# Patient Record
Sex: Female | Born: 1955 | Race: White | Hispanic: No | Marital: Married | Smoking: Never smoker
Health system: Southern US, Community
[De-identification: ages and names within clinical notes are randomized; demographics above are authoritative.]

## PROBLEM LIST (undated history)

## (undated) DIAGNOSIS — Z780 Asymptomatic menopausal state: Secondary | ICD-10-CM

## (undated) DIAGNOSIS — C50911 Malignant neoplasm of unspecified site of right female breast: Secondary | ICD-10-CM

## (undated) HISTORY — DX: Asymptomatic menopausal state: Z78.0

---

## 2006-12-10 DIAGNOSIS — Z780 Asymptomatic menopausal state: Secondary | ICD-10-CM

## 2006-12-10 HISTORY — DX: Asymptomatic menopausal state: Z78.0

## 2013-12-10 DIAGNOSIS — C50911 Malignant neoplasm of unspecified site of right female breast: Secondary | ICD-10-CM

## 2013-12-10 HISTORY — DX: Malignant neoplasm of unspecified site of right female breast: C50.911

## 2014-12-13 ENCOUNTER — Ambulatory Visit: Payer: Self-pay | Admitting: Oncology

## 2014-12-15 LAB — CBC CANCER CENTER
BASOS ABS: 0 x10 3/mm (ref 0.0–0.1)
BASOS PCT: 1 %
Eosinophil #: 0 x10 3/mm (ref 0.0–0.7)
Eosinophil %: 1 %
HCT: 38.3 % (ref 35.0–47.0)
HGB: 12.9 g/dL (ref 12.0–16.0)
LYMPHS PCT: 31 %
Lymphocyte #: 1.2 x10 3/mm (ref 1.0–3.6)
MCH: 29.7 pg (ref 26.0–34.0)
MCHC: 33.6 g/dL (ref 32.0–36.0)
MCV: 88 fL (ref 80–100)
MONOS PCT: 6.7 %
Monocyte #: 0.3 x10 3/mm (ref 0.2–0.9)
NEUTROS PCT: 60.3 %
Neutrophil #: 2.3 x10 3/mm (ref 1.4–6.5)
PLATELETS: 192 x10 3/mm (ref 150–440)
RBC: 4.33 10*6/uL (ref 3.80–5.20)
RDW: 14 % (ref 11.5–14.5)
WBC: 3.7 x10 3/mm (ref 3.6–11.0)

## 2014-12-15 LAB — COMPREHENSIVE METABOLIC PANEL
ALBUMIN: 4.3 g/dL (ref 3.4–5.0)
ALT: 23 U/L
Alkaline Phosphatase: 155 U/L — ABNORMAL HIGH
Anion Gap: 11 (ref 7–16)
BILIRUBIN TOTAL: 0.5 mg/dL (ref 0.2–1.0)
BUN: 13 mg/dL (ref 7–18)
CREATININE: 0.73 mg/dL (ref 0.60–1.30)
Calcium, Total: 9.4 mg/dL (ref 8.5–10.1)
Chloride: 102 mmol/L (ref 98–107)
Co2: 28 mmol/L (ref 21–32)
EGFR (African American): >60
EGFR (Non-African Amer.): >60
Glucose: 94 mg/dL (ref 65–99)
OSMOLALITY: 281 (ref 275–301)
Potassium: 3.9 mmol/L (ref 3.5–5.1)
SGOT(AST): 13 U/L — ABNORMAL LOW (ref 15–37)
SODIUM: 141 mmol/L (ref 136–145)
Total Protein: 7.7 g/dL (ref 6.4–8.2)

## 2014-12-17 LAB — CANCER ANTIGEN 27.29: CA 27.29: 221 U/mL — ABNORMAL HIGH (ref 0.0–38.6)

## 2015-01-05 LAB — COMPREHENSIVE METABOLIC PANEL
ALK PHOS: 128 U/L — AB (ref 46–116)
ALT: 16 U/L (ref 14–63)
Albumin: 4.1 g/dL (ref 3.4–5.0)
Anion Gap: 10 (ref 7–16)
BILIRUBIN TOTAL: 0.5 mg/dL (ref 0.2–1.0)
BUN: 15 mg/dL (ref 7–18)
Calcium, Total: 8.7 mg/dL (ref 8.5–10.1)
Chloride: 101 mmol/L (ref 98–107)
Co2: 28 mmol/L (ref 21–32)
Creatinine: 0.83 mg/dL (ref 0.60–1.30)
EGFR (African American): >60
EGFR (Non-African Amer.): >60
GLUCOSE: 97 mg/dL (ref 65–99)
OSMOLALITY: 278 (ref 275–301)
POTASSIUM: 4.1 mmol/L (ref 3.5–5.1)
SGOT(AST): 10 U/L — ABNORMAL LOW (ref 15–37)
SODIUM: 139 mmol/L (ref 136–145)
TOTAL PROTEIN: 7.4 g/dL (ref 6.4–8.2)

## 2015-01-05 LAB — CBC CANCER CENTER
Basophil #: 0 x10 3/mm (ref 0.0–0.1)
Basophil %: 0.8 %
EOS PCT: 1.4 %
Eosinophil #: 0 x10 3/mm (ref 0.0–0.7)
HCT: 37.4 % (ref 35.0–47.0)
HGB: 12.2 g/dL (ref 12.0–16.0)
LYMPHS ABS: 0.8 x10 3/mm — AB (ref 1.0–3.6)
Lymphocyte %: 53.3 %
MCH: 29.6 pg (ref 26.0–34.0)
MCHC: 32.7 g/dL (ref 32.0–36.0)
MCV: 90 fL (ref 80–100)
MONO ABS: 0 x10 3/mm — AB (ref 0.2–0.9)
Monocyte %: 2.7 %
Neutrophil #: 0.6 x10 3/mm — ABNORMAL LOW (ref 1.4–6.5)
Neutrophil %: 41.8 %
Platelet: 165 x10 3/mm (ref 150–440)
RBC: 4.14 10*6/uL (ref 3.80–5.20)
RDW: 13.8 % (ref 11.5–14.5)
WBC: 1.4 x10 3/mm — CL (ref 3.6–11.0)

## 2015-01-10 ENCOUNTER — Ambulatory Visit: Payer: Self-pay | Admitting: Oncology

## 2015-02-08 ENCOUNTER — Ambulatory Visit
Admit: 2015-02-08 | Disposition: A | Discharge status: Home / Self Care | Payer: Self-pay | Attending: Oncology | Admitting: Oncology

## 2015-03-09 LAB — COMPREHENSIVE METABOLIC PANEL
ALBUMIN: 4.5 g/dL
ALK PHOS: 57 U/L
ANION GAP: 9 (ref 7–16)
BUN: 14 mg/dL
Bilirubin,Total: 0.6 mg/dL
CO2: 26 mmol/L
CREATININE: 0.69 mg/dL
Calcium, Total: 9.5 mg/dL
Chloride: 104 mmol/L
EGFR (African American): >60
EGFR (Non-African Amer.): >60
GLUCOSE: 82 mg/dL
Potassium: 3.9 mmol/L
SGOT(AST): 20 U/L
SGPT (ALT): 14 U/L
Sodium: 139 mmol/L
Total Protein: 7.3 g/dL

## 2015-03-09 LAB — CBC CANCER CENTER
BANDS NEUTROPHIL: 4 %
Comment - H1-Com2: NORMAL
EOS PCT: 1 %
HCT: 34.1 % — AB (ref 35.0–47.0)
HGB: 11.1 g/dL — AB (ref 12.0–16.0)
Lymphocytes: 42 %
MCH: 32 pg (ref 26.0–34.0)
MCHC: 32.5 g/dL (ref 32.0–36.0)
MCV: 99 fL (ref 80–100)
Monocytes: 3 %
PLATELETS: 99 x10 3/mm — AB (ref 150–440)
RBC: 3.46 10*6/uL — AB (ref 3.80–5.20)
RDW: 21.6 % — ABNORMAL HIGH (ref 11.5–14.5)
SEGMENTED NEUTROPHILS: 50 %
WBC: 1.7 x10 3/mm — CL (ref 3.6–11.0)

## 2015-03-10 LAB — CANCER ANTIGEN 27.29: CA 27.29: 122.8 U/mL — AB (ref 0.0–38.6)

## 2015-03-11 ENCOUNTER — Ambulatory Visit
Admit: 2015-03-11 | Disposition: A | Discharge status: Home / Self Care | Payer: Self-pay | Attending: Oncology | Admitting: Oncology

## 2015-04-06 LAB — CBC CANCER CENTER
BASOS ABS: 0 x10 3/mm (ref 0.0–0.1)
BASOS PCT: 1.2 %
EOS PCT: 1 %
Eosinophil #: 0 x10 3/mm (ref 0.0–0.7)
HCT: 33.4 % — AB (ref 35.0–47.0)
HGB: 11.4 g/dL — AB (ref 12.0–16.0)
LYMPHS ABS: 0.9 x10 3/mm — AB (ref 1.0–3.6)
LYMPHS PCT: 43 %
MCH: 33.9 pg (ref 26.0–34.0)
MCHC: 33.9 g/dL (ref 32.0–36.0)
MCV: 100 fL (ref 80–100)
MONOS PCT: 6.2 %
Monocyte #: 0.1 x10 3/mm — ABNORMAL LOW (ref 0.2–0.9)
NEUTROS ABS: 1 x10 3/mm — AB (ref 1.4–6.5)
Neutrophil %: 48.6 %
Platelet: 168 x10 3/mm (ref 150–440)
RBC: 3.35 10*6/uL — AB (ref 3.80–5.20)
RDW: 21.6 % — AB (ref 11.5–14.5)
WBC: 2.2 x10 3/mm — AB (ref 3.6–11.0)

## 2015-04-06 LAB — COMPREHENSIVE METABOLIC PANEL
Albumin: 4.7 g/dL
Alkaline Phosphatase: 62 U/L
Anion Gap: 6 — ABNORMAL LOW (ref 7–16)
BUN: 20 mg/dL
Bilirubin,Total: 0.6 mg/dL
CO2: 28 mmol/L
CREATININE: 0.78 mg/dL
Calcium, Total: 9.5 mg/dL
Chloride: 105 mmol/L
EGFR (African American): >60
EGFR (Non-African Amer.): >60
GLUCOSE: 90 mg/dL
Potassium: 4 mmol/L
SGOT(AST): 17 U/L
SGPT (ALT): 14 U/L
Sodium: 139 mmol/L
TOTAL PROTEIN: 7.8 g/dL

## 2015-04-07 LAB — CANCER ANTIGEN 27.29: CA 27.29: 121.1 U/mL — ABNORMAL HIGH (ref 0.0–38.6)

## 2015-04-29 ENCOUNTER — Other Ambulatory Visit: Payer: Self-pay | Admitting: *Deleted

## 2015-04-29 DIAGNOSIS — C50919 Malignant neoplasm of unspecified site of unspecified female breast: Secondary | ICD-10-CM

## 2015-05-04 ENCOUNTER — Inpatient Hospital Stay (HOSPITAL_BASED_OUTPATIENT_CLINIC_OR_DEPARTMENT_OTHER): Payer: BLUE CROSS/BLUE SHIELD | Admitting: Oncology

## 2015-05-04 ENCOUNTER — Encounter: Payer: Self-pay | Admitting: Oncology

## 2015-05-04 ENCOUNTER — Encounter (INDEPENDENT_AMBULATORY_CARE_PROVIDER_SITE_OTHER): Payer: Self-pay

## 2015-05-04 ENCOUNTER — Inpatient Hospital Stay: Payer: BLUE CROSS/BLUE SHIELD | Attending: Oncology

## 2015-05-04 ENCOUNTER — Other Ambulatory Visit: Payer: Self-pay | Admitting: Oncology

## 2015-05-04 ENCOUNTER — Inpatient Hospital Stay: Payer: BLUE CROSS/BLUE SHIELD

## 2015-05-04 VITALS — BP 163/76 | HR 71 | Temp 95.9°F | Wt 144.8 lb

## 2015-05-04 DIAGNOSIS — D702 Other drug-induced agranulocytosis: Secondary | ICD-10-CM

## 2015-05-04 DIAGNOSIS — T50995S Adverse effect of other drugs, medicaments and biological substances, sequela: Secondary | ICD-10-CM

## 2015-05-04 DIAGNOSIS — Z79899 Other long term (current) drug therapy: Secondary | ICD-10-CM | POA: Insufficient documentation

## 2015-05-04 DIAGNOSIS — C50811 Malignant neoplasm of overlapping sites of right female breast: Secondary | ICD-10-CM

## 2015-05-04 DIAGNOSIS — Z170 Estrogen receptor positive status [ER+]: Secondary | ICD-10-CM

## 2015-05-04 DIAGNOSIS — C50911 Malignant neoplasm of unspecified site of right female breast: Secondary | ICD-10-CM

## 2015-05-04 DIAGNOSIS — C7951 Secondary malignant neoplasm of bone: Secondary | ICD-10-CM | POA: Insufficient documentation

## 2015-05-04 DIAGNOSIS — Z79818 Long term (current) use of other agents affecting estrogen receptors and estrogen levels: Secondary | ICD-10-CM | POA: Insufficient documentation

## 2015-05-04 DIAGNOSIS — C50919 Malignant neoplasm of unspecified site of unspecified female breast: Secondary | ICD-10-CM

## 2015-05-04 LAB — CBC WITH DIFFERENTIAL/PLATELET
Basophils Absolute: 0 10*3/uL (ref 0–0.1)
Eosinophils Absolute: 0 10*3/uL (ref 0–0.7)
HCT: 34.3 % — ABNORMAL LOW (ref 35.0–47.0)
Hemoglobin: 11.7 g/dL — ABNORMAL LOW (ref 12.0–16.0)
LYMPHS ABS: 0.7 10*3/uL — AB (ref 1.0–3.6)
Lymphocytes Relative: 39 %
MCH: 35.3 pg — AB (ref 26.0–34.0)
MCHC: 34 g/dL (ref 32.0–36.0)
MCV: 103.8 fL — ABNORMAL HIGH (ref 80.0–100.0)
Monocytes Absolute: 0.1 10*3/uL — ABNORMAL LOW (ref 0.2–0.9)
Monocytes Relative: 6 %
Neutro Abs: 0.9 10*3/uL — ABNORMAL LOW (ref 1.4–6.5)
Neutrophils Relative %: 52 %
Platelets: 178 10*3/uL (ref 150–440)
RBC: 3.31 MIL/uL — ABNORMAL LOW (ref 3.80–5.20)
RDW: 18.3 % — ABNORMAL HIGH (ref 11.5–14.5)
WBC: 1.8 10*3/uL — ABNORMAL LOW (ref 3.6–11.0)

## 2015-05-04 LAB — COMPREHENSIVE METABOLIC PANEL
ALBUMIN: 4.6 g/dL (ref 3.5–5.0)
ALT: 14 U/L (ref 14–54)
AST: 18 U/L (ref 15–41)
Alkaline Phosphatase: 58 U/L (ref 38–126)
Anion gap: 4 — ABNORMAL LOW (ref 5–15)
BILIRUBIN TOTAL: 0.7 mg/dL (ref 0.3–1.2)
BUN: 17 mg/dL (ref 6–20)
CALCIUM: 9.7 mg/dL (ref 8.9–10.3)
CHLORIDE: 104 mmol/L (ref 101–111)
CO2: 30 mmol/L (ref 22–32)
CREATININE: 0.68 mg/dL (ref 0.44–1.00)
GFR calc Af Amer: >60 mL/min (ref >60)
GFR calc non Af Amer: >60 mL/min (ref >60)
GLUCOSE: 86 mg/dL (ref 65–99)
POTASSIUM: 3.6 mmol/L (ref 3.5–5.1)
Sodium: 138 mmol/L (ref 135–145)
Total Protein: 7.8 g/dL (ref 6.5–8.1)

## 2015-05-04 MED ORDER — DENOSUMAB 120 MG/1.7ML ~~LOC~~ SOLN
120.0000 mg | Freq: Once | SUBCUTANEOUS | Status: AC
  Administered 2015-05-04: 120 mg via SUBCUTANEOUS
  Filled 2015-05-04: qty 1.7

## 2015-05-04 NOTE — Progress Notes (Signed)
Pt never smoked.  Does not have living will.

## 2015-05-05 ENCOUNTER — Telehealth: Payer: Self-pay | Admitting: *Deleted

## 2015-05-05 LAB — CANCER ANTIGEN 27.29: CA 27.29: 95.5 U/mL — AB (ref 0.0–38.6)

## 2015-05-05 NOTE — Telephone Encounter (Signed)
Called patient and left message to inform her that CA 27.29 is looking better. 95.5 as of 05-04-15.

## 2015-05-09 ENCOUNTER — Encounter: Payer: Self-pay | Admitting: Oncology

## 2015-05-09 NOTE — Progress Notes (Signed)
[name] @ [facility] Telephone:(336) [phone]  Fax:(336) [place] OB: 30-Aug-1956  MR#: [id number]  PNT#:[id number]  Patient Care Team: Pcp Not In System as PCP - General  CHIEF COMPLAINT:  Chief Complaint  Patient presents with  . Follow-up    Oncology History   1carcinoma of breast biopsy done from the mass at 9:00 position (right breast) at [facility] on October 20, 2014.   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  Estrogen receptor +94%.  Progesterone receptor +95%.  HER-2/neu receptor -0. 2.  X-rays of the lumbar spine done in now September of 2015 revealed diffuse sclerotic bone lesion. 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  Liver is normal.  There are multiple sub-centimeter porta hepatis and pelvic and inguinal lymphadenopathy 4.  Recent has received 2 weeks of radiation therapy to the right hip at [facility] 5.  Has been started on letrozole 3 weeks ago (November 22, 2014) 6.recent has been switched to [facility] and letrozole 2 weeks ago starting from January 13 (IBRANCE 125 mg by mouth for 3 weeks with one week off ) dose of IBRANCE has been reduced 100 mg because of neutropenia     Cancer of breast   05/04/2015 Initial Diagnosis Cancer of breast    Oncology Flowsheet 05/04/2015  denosumab (XGEVA) [place] 120 mg    INTERVAL HISTORY:  59 year old lady came today for further follow-up regarding stage IV carcinoma breast.  Patient is taking IBRANCE, letrozole Also getting XGEVA.  No bony pain appetite has been fairly good.  Ambulation is improved.  No nausea.  No vomiting.  No diarrhea.  REVIEW OF SYSTEMS:   GENERAL:  Feels good.  Active.  No fevers, sweats or weight loss. PERFORMANCE STATUS (ECOG):0 HEENT:  No visual changes, runny nose, sore  throat, mouth sores or tenderness. Lungs: No shortness of breath or cough.  No hemoptysis. Cardiac:  No chest pain, palpitations, orthopnea, or PND. GI:  No nausea, vomiting, diarrhea, constipation, melena or hematochezia. GU:  No urgency, frequency, dysuria, or hematuria. Musculoskeletal:  No back pain.  No joint pain.  No muscle tenderness. Extremities:  No pain or swelling. Skin:  No rashes or skin changes. Neuro:  No headache, numbness or weakness, balance or coordination issues. Endocrine:  No diabetes, thyroid issues, hot flashes or night sweats. Psych:  No mood changes, depression or anxiety. Pain:  No focal pain. Review of systems:  All other systems reviewed and found to be negative.  As per HPI. Otherwise, a complete review of systems is negatve.  PAST MEDICAL HISTORY: Past Medical History  Diagnosis Date  . Cancer of breast 05/04/2015    PAST SURGICAL HISTORY: No past surgical history on file.  FAMILY HISTORY No family history on file. Preventive Screening:  Has patient had any of the following test? Colonscopy  Mammography  Pap Smear (1)   Last Colonoscopy: Never(1)   Last Mammography: 2015(1)   Last Pap Smear: Nov 2015(1)   Smoking History: Smoking History Never Smoked.(1)  PFSH: Family History: noncontributory  Social History: negative alcohol, negative tobacco  Additional Past Medical and Surgical History: no other major medical illness  ECOLOGIC HISTORY:  No LMP recorded.     ADVANCED DIRECTIVES:    HEALTH MAINTENANCE: History  Substance Use Topics  . Smoking  status: Never Smoker   . Smokeless tobacco: Not on file  . Alcohol Use: Not on file     Colonoscopy:  PAP:  Bone density:  Lipid panel:  Allergies  Allergen Reactions  . Beef Extract Anaphylaxis  . Pork (Porcine) Protein Anaphylaxis  . Beef-Derived Products Hives    Current Outpatient Prescriptions  Medication Sig Dispense Refill  . Ascorbic Acid (VITAMIN C) 100 MG tablet Take  100 mg by mouth daily.    . calcium-vitamin D (OSCAL WITH D) 500-200 MG-UNIT per tablet Take 1 tablet by mouth 3 (three) times daily.    Marland Kitchen letrozole (FEMARA) 2.5 MG tablet Take 2.5 mg by mouth.    . Multiple Vitamins-Minerals (MULTIVITAMIN WITH MINERALS) tablet Take 1 tablet by mouth daily.    . palbociclib (IBRANCE) 100 MG capsule Take 125 mg by mouth daily.     No current facility-administered medications for this visit.    OBJECTIVE:  Filed Vitals:   05/04/15 1112  BP: 163/76  Pulse: 71  Temp: 95.9 F (35.5 C)     There is no height on file to calculate BMI.    ECOG FS:0 - Asymptomatic  PHYSICAL EXAM: GENERAL:  Well developed, well nourished, sitting comfortably in the exam room in no acute distress. MENTAL STATUS:  Alert and oriented to person, place and time. HEAD:   Normocephalic, atraumatic, face symmetric, no Cushingoid features. EYES:    Pupils equal round and reactive to light and accomodation.  No conjunctivitis or scleral icterus. ENT:  Oropharynx clear without lesion.  Tongue normal. Mucous membranes moist.  RESPIRATORY:  Clear to auscultation without rales, wheezes or rhonchi. CARDIOVASCULAR:  Regular rate and rhythm without murmur, rub or gallop. BREAST:  Right breast : Palpable mass is decreased in size. NO skin changes or nipple discharge.  Left breast without masses, skin changes or nipple discharge. ABDOMEN:  Soft, non-tender, with active bowel sounds, and no hepatosplenomegaly.  No masses. BACK:  No CVA tenderness.  No tenderness on percussion of the back or rib cage. SKIN:  No rashes, ulcers or lesions. EXTREMITIES: No edema, no skin discoloration or tenderness.  No palpable cords. LYMPH NODES: No palpable cervical, supraclavicular, axillary or inguinal adenopathy  NEUROLOGICAL: Unremarkable. PSYCH:  Appropriate.   LAB RESULTS:  Appointment on 05/04/2015  Component Date Value Ref Range Status  . WBC 05/04/2015 1.8* 3.6 - 11.0 K/uL Final   Comment:  CANCER CENTER CRITICAL VALUE PROTOCOL RESULT REPEATED AND VERIFIED   . RBC 05/04/2015 3.31* 3.80 - 5.20 MIL/uL Final  . Hemoglobin 05/04/2015 11.7* 12.0 - 16.0 g/dL Final  . HCT 05/04/2015 34.3* 35.0 - 47.0 % Final  . MCV 05/04/2015 103.8* 80.0 - 100.0 fL Final  . MCH 05/04/2015 35.3* 26.0 - 34.0 pg Final  . MCHC 05/04/2015 34.0  32.0 - 36.0 g/dL Final  . RDW 05/04/2015 18.3* 11.5 - 14.5 % Final  . Platelets 05/04/2015 178  150 - 440 K/uL Final  . Neutrophils Relative % 05/04/2015 52%   Final  . Neutro Abs 05/04/2015 0.9* 1.4 - 6.5 K/uL Final  . Lymphocytes Relative 05/04/2015 39%   Final  . Lymphs Abs 05/04/2015 0.7* 1.0 - 3.6 K/uL Final  . Monocytes Relative 05/04/2015 6%   Final  . Monocytes Absolute 05/04/2015 0.1* 0.2 - 0.9 K/uL Final  . Eosinophils Relative 05/04/2015 1%   Final  . Eosinophils Absolute 05/04/2015 0.0  0 - 0.7 K/uL Final  . Basophils Relative 05/04/2015 2%   Final  . Basophils  Absolute 05/04/2015 0.0  0 - 0.1 K/uL Final  . Sodium 05/04/2015 138  135 - 145 mmol/L Final  . Potassium 05/04/2015 3.6  3.5 - 5.1 mmol/L Final  . Chloride 05/04/2015 104  101 - 111 mmol/L Final  . CO2 05/04/2015 30  22 - 32 mmol/L Final  . Glucose, Bld 05/04/2015 86  65 - 99 mg/dL Final  . BUN 05/04/2015 17  6 - 20 mg/dL Final  . Creatinine, Ser 05/04/2015 0.68  0.44 - 1.00 mg/dL Final  . Calcium 05/04/2015 9.7  8.9 - 10.3 mg/dL Final  . Total Protein 05/04/2015 7.8  6.5 - 8.1 g/dL Final  . Albumin 05/04/2015 4.6  3.5 - 5.0 g/dL Final  . AST 05/04/2015 18  15 - 41 U/L Final  . ALT 05/04/2015 14  14 - 54 U/L Final  . Alkaline Phosphatase 05/04/2015 58  38 - 126 U/L Final  . Total Bilirubin 05/04/2015 0.7  0.3 - 1.2 mg/dL Final  . GFR calc non Af Amer 05/04/2015 >60  >60 mL/min Final  . GFR calc Af Amer 05/04/2015 >60  >60 mL/min Final   Comment: (NOTE) The eGFR has been calculated using the CKD EPI equation. This calculation has not been validated in all clinical situations. eGFR's  persistently <60 mL/min signify possible Chronic Kidney Disease.   . Anion gap 05/04/2015 4* 5 - 15 Final  . 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   MEDICAL DECISION MAKING:  All lab data has been reviewed.  Tumor markers was gradually declining trend. Reevaluation with PET scan and a bone scan and plain x-ray of the hip. Continue present dose of IBRANCE (100 mg daily for 3 weeks from week off) and  LETRAZOL . Continue XGEVA  Patient expressed understanding and was in agreement with this plan. She also understands that She can call clinic at any time with any questions, concerns, or complaints.    Cancer of breast   Staging form: Breast, AJCC 7th Edition     Clinical: Stage IV (T1b, N0, M1) - Signed by Forest Gleason, MD on 05/09/2015   Forest Gleason, MD   05/09/2015 3:27 PM

## 2015-05-25 ENCOUNTER — Ambulatory Visit
Admission: RE | Admit: 2015-05-25 | Discharge: 2015-05-25 | Disposition: A | Discharge status: Home / Self Care | Payer: BLUE CROSS/BLUE SHIELD | Source: Ambulatory Visit | Attending: Oncology | Admitting: Oncology

## 2015-05-25 ENCOUNTER — Other Ambulatory Visit: Payer: Self-pay | Admitting: *Deleted

## 2015-05-25 DIAGNOSIS — N1330 Unspecified hydronephrosis: Secondary | ICD-10-CM | POA: Insufficient documentation

## 2015-05-25 DIAGNOSIS — C50911 Malignant neoplasm of unspecified site of right female breast: Secondary | ICD-10-CM

## 2015-05-25 DIAGNOSIS — C7951 Secondary malignant neoplasm of bone: Secondary | ICD-10-CM

## 2015-05-25 DIAGNOSIS — D259 Leiomyoma of uterus, unspecified: Secondary | ICD-10-CM | POA: Insufficient documentation

## 2015-05-25 DIAGNOSIS — I517 Cardiomegaly: Secondary | ICD-10-CM | POA: Insufficient documentation

## 2015-05-25 LAB — GLUCOSE, CAPILLARY: Glucose-Capillary: 88 mg/dL (ref 65–99)

## 2015-05-25 MED ORDER — FLUDEOXYGLUCOSE F - 18 (FDG) INJECTION
12.3000 | Freq: Once | INTRAVENOUS | Status: AC | PRN
  Administered 2015-05-25: 12.3 via INTRAVENOUS

## 2015-06-01 ENCOUNTER — Inpatient Hospital Stay: Payer: BLUE CROSS/BLUE SHIELD

## 2015-06-01 ENCOUNTER — Inpatient Hospital Stay: Payer: BLUE CROSS/BLUE SHIELD | Attending: Oncology

## 2015-06-01 ENCOUNTER — Inpatient Hospital Stay (HOSPITAL_BASED_OUTPATIENT_CLINIC_OR_DEPARTMENT_OTHER): Payer: BLUE CROSS/BLUE SHIELD | Admitting: Oncology

## 2015-06-01 VITALS — BP 157/81 | HR 70 | Temp 96.9°F | Wt 147.3 lb

## 2015-06-01 DIAGNOSIS — C7951 Secondary malignant neoplasm of bone: Secondary | ICD-10-CM | POA: Insufficient documentation

## 2015-06-01 DIAGNOSIS — C50811 Malignant neoplasm of overlapping sites of right female breast: Secondary | ICD-10-CM | POA: Diagnosis not present

## 2015-06-01 DIAGNOSIS — C50911 Malignant neoplasm of unspecified site of right female breast: Secondary | ICD-10-CM

## 2015-06-01 DIAGNOSIS — Z7951 Long term (current) use of inhaled steroids: Secondary | ICD-10-CM

## 2015-06-01 DIAGNOSIS — Z170 Estrogen receptor positive status [ER+]: Secondary | ICD-10-CM | POA: Insufficient documentation

## 2015-06-01 DIAGNOSIS — Z79818 Long term (current) use of other agents affecting estrogen receptors and estrogen levels: Secondary | ICD-10-CM

## 2015-06-01 LAB — COMPREHENSIVE METABOLIC PANEL
ALT: 14 U/L (ref 14–54)
AST: 18 U/L (ref 15–41)
Albumin: 4.3 g/dL (ref 3.5–5.0)
Alkaline Phosphatase: 55 U/L (ref 38–126)
Anion gap: 6 (ref 5–15)
BUN: 13 mg/dL (ref 6–20)
CHLORIDE: 105 mmol/L (ref 101–111)
CO2: 28 mmol/L (ref 22–32)
Calcium: 8.9 mg/dL (ref 8.9–10.3)
Creatinine, Ser: 0.75 mg/dL (ref 0.44–1.00)
GFR calc Af Amer: >60 mL/min (ref >60)
Glucose, Bld: 81 mg/dL (ref 65–99)
Potassium: 4.3 mmol/L (ref 3.5–5.1)
Sodium: 139 mmol/L (ref 135–145)
Total Bilirubin: 0.4 mg/dL (ref 0.3–1.2)
Total Protein: 7.2 g/dL (ref 6.5–8.1)

## 2015-06-01 LAB — CBC WITH DIFFERENTIAL/PLATELET
Basophils Absolute: 0 10*3/uL (ref 0–0.1)
Basophils Relative: 2 %
EOS PCT: 2 %
Eosinophils Absolute: 0 10*3/uL (ref 0–0.7)
HEMATOCRIT: 33.3 % — AB (ref 35.0–47.0)
HEMOGLOBIN: 11.1 g/dL — AB (ref 12.0–16.0)
LYMPHS ABS: 0.7 10*3/uL — AB (ref 1.0–3.6)
Lymphocytes Relative: 47 %
MCH: 35.6 pg — AB (ref 26.0–34.0)
MCHC: 33.4 g/dL (ref 32.0–36.0)
MCV: 106.7 fL — ABNORMAL HIGH (ref 80.0–100.0)
MONOS PCT: 6 %
Monocytes Absolute: 0.1 10*3/uL — ABNORMAL LOW (ref 0.2–0.9)
Neutro Abs: 0.6 10*3/uL — ABNORMAL LOW (ref 1.4–6.5)
Neutrophils Relative %: 43 %
Platelets: 151 10*3/uL (ref 150–440)
RBC: 3.12 MIL/uL — AB (ref 3.80–5.20)
RDW: 15.8 % — ABNORMAL HIGH (ref 11.5–14.5)
WBC: 1.4 10*3/uL — CL (ref 3.6–11.0)

## 2015-06-01 MED ORDER — DENOSUMAB 120 MG/1.7ML ~~LOC~~ SOLN
120.0000 mg | Freq: Once | SUBCUTANEOUS | Status: AC
  Administered 2015-06-01: 120 mg via SUBCUTANEOUS
  Filled 2015-06-01: qty 1.7

## 2015-06-01 NOTE — Progress Notes (Signed)
Patient does not have living will.  Never smoked. 

## 2015-06-02 LAB — CANCER ANTIGEN 27.29: CA 27.29: 85.2 U/mL — ABNORMAL HIGH (ref 0.0–38.6)

## 2015-06-04 ENCOUNTER — Encounter: Payer: Self-pay | Admitting: Oncology

## 2015-06-04 NOTE — Progress Notes (Signed)
[place] @ [facility] Telephone:(336) [phone]  Fax:(336) [place] OB: 01-Dec-1956  MR#: [id number]  ZDG#:[id number]  Patient Care Team: Gerilyn Pilgrim, FNP as PCP - General (Nurse Practitioner)  CHIEF COMPLAINT:  Chief Complaint  Patient presents with  . Follow-up    Oncology History   1carcinoma of breast biopsy done from the mass at 9:00 position (right breast) at [facility] on October 20, 2014.   T1 cN0 M1 stage IV disease [id number] biopsies positive for invasive mammary carcinoma with both Dr. lobular features.  Estrogen receptor +94%.  Progesterone receptor +95%.  HER-2/neu receptor -0. 2.  X-rays of the lumbar spine done in now September of 2015 revealed diffuse sclerotic bone lesion. 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  Liver is normal.  There are multiple sub-centimeter porta hepatis and pelvic and inguinal lymphadenopathy 4.  Recent has received 2 weeks of radiation therapy to the right hip at [facility] 5.  Has been started on letrozole 3 weeks ago (November 22, 2014) 6.recent has been switched to [facility] and letrozole 2 weeks ago starting from January 13 (IBRANCE 125 mg by mouth for 3 weeks with one week off ) dose of IBRANCE has been reduced 100 mg because of neutropenia     Cancer of breast   05/04/2015 Initial Diagnosis Cancer of breast    Oncology Flowsheet 05/04/2015 06/01/2015  denosumab (XGEVA) [place] 120 mg 120 mg    INTERVAL HISTORY:  59 year old lady came today for further follow-up regarding stage IV carcinoma breast.  Patient is taking IBRANCE, letrozole Also getting XGEVA.  No bony pain appetite has been fairly good.  Ambulation is improved.  No nausea.  No vomiting.  No diarrhea.. June, 2016 she is here for ongoing evaluation and had a repeat PET scan and plain x-rays.  Bony pains are improved.  REVIEW OF  SYSTEMS:   GENERAL:  Feels good.  Active.  No fevers, sweats or weight loss. PERFORMANCE STATUS (ECOG):0 HEENT:  No visual changes, runny nose, sore throat, mouth sores or tenderness. Lungs: No shortness of breath or cough.  No hemoptysis. Cardiac:  No chest pain, palpitations, orthopnea, or PND. GI:  No nausea, vomiting, diarrhea, constipation, melena or hematochezia. GU:  No urgency, frequency, dysuria, or hematuria. Musculoskeletal:  No back pain.  No joint pain.  No muscle tenderness. Extremities:  No pain or swelling. Skin:  No rashes or skin changes. Neuro:  No headache, numbness or weakness, balance or coordination issues. Endocrine:  No diabetes, thyroid issues, hot flashes or night sweats. Psych:  No mood changes, depression or anxiety. Pain:  No focal pain. Review of systems:  All other systems reviewed and found to be negative.  As per HPI. Otherwise, a complete review of systems is negatve.  PAST MEDICAL HISTORY: Past Medical History  Diagnosis Date  . Cancer of breast 05/04/2015    PAST SURGICAL HISTORY: No past surgical history on file.  FAMILY HISTORY No family history on file. Preventive Screening:  Has patient had any of the following test? Colonscopy  Mammography  Pap Smear (1)   Last Colonoscopy: Never(1)   Last Mammography: 2015(1)   Last Pap Smear: Nov 2015(1)   Smoking History: Smoking History Never Smoked.(1)  PFSH: Family History: noncontributory  Social History: negative alcohol, negative tobacco  Additional Past Medical and Surgical History: no other major medical illness  ECOLOGIC HISTORY:  No LMP recorded (approximate). Patient is postmenopausal.     ADVANCED DIRECTIVES: Patient does not have any advanced healthcare directive. Information has been given.   HEALTH MAINTENANCE: History  Substance Use Topics  . Smoking status: Never Smoker   . Smokeless tobacco: Not on file  . Alcohol Use: Not on file      Allergies  Allergen  Reactions  . Beef Extract Anaphylaxis  . Pork (Porcine) Protein Anaphylaxis  . Beef-Derived Products Hives    Current Outpatient Prescriptions  Medication Sig Dispense Refill  . Ascorbic Acid (VITAMIN C) 100 MG tablet Take 100 mg by mouth daily.    . calcium-vitamin D (OSCAL WITH D) 500-200 MG-UNIT per tablet Take 1 tablet by mouth 3 (three) times daily.    Marland Kitchen letrozole (FEMARA) 2.5 MG tablet Take 2.5 mg by mouth.    . Multiple Vitamins-Minerals (MULTIVITAMIN WITH MINERALS) tablet Take 1 tablet by mouth daily.    . palbociclib (IBRANCE) 100 MG capsule Take 125 mg by mouth daily.     No current facility-administered medications for this visit.    OBJECTIVE:  Filed Vitals:   06/01/15 1025  BP: 157/81  Pulse: 70  Temp: 96.9 F (36.1 C)     Body mass index is 27.84 kg/(m^2).    ECOG FS:0 - Asymptomatic  PHYSICAL EXAM: GENERAL:  Well developed, well nourished, sitting comfortably in the exam room in no acute distress. MENTAL STATUS:  Alert and oriented to person, place and time. HEAD:   Normocephalic, atraumatic, face symmetric, no Cushingoid features. EYES:    Pupils equal round and reactive to light and accomodation.  No conjunctivitis or scleral icterus. ENT:  Oropharynx clear without lesion.  Tongue normal. Mucous membranes moist.  RESPIRATORY:  Clear to auscultation without rales, wheezes or rhonchi. CARDIOVASCULAR:  Regular rate and rhythm without murmur, rub or gallop. BREAST:  Right breast : Palpable mass is decreased in size. NO skin changes or nipple discharge.  Left breast without masses, skin changes or nipple discharge. ABDOMEN:  Soft, non-tender, with active bowel sounds, and no hepatosplenomegaly.  No masses. BACK:  No CVA tenderness.  No tenderness on percussion of the back or rib cage. SKIN:  No rashes, ulcers or lesions. EXTREMITIES: No edema, no skin discoloration or tenderness.  No palpable cords. LYMPH NODES: No palpable cervical, supraclavicular, axillary or  inguinal adenopathy  NEUROLOGICAL: Unremarkable. PSYCH:  Appropriate.   LAB RESULTS:  Appointment on 06/01/2015  Component Date Value Ref Range Status  . WBC 06/01/2015 1.4* 3.6 - 11.0 K/uL Final   Comment: RESULT REPEATED AND VERIFIED CANCER CENTER CRITICAL VALUE PROTOCOL   . RBC 06/01/2015 3.12* 3.80 - 5.20 MIL/uL Final  . Hemoglobin 06/01/2015 11.1* 12.0 - 16.0 g/dL Final  . HCT 06/01/2015 33.3* 35.0 - 47.0 % Final  . MCV 06/01/2015 106.7* 80.0 - 100.0 fL Final  . MCH 06/01/2015 35.6* 26.0 - 34.0 pg Final  . MCHC 06/01/2015 33.4  32.0 - 36.0 g/dL Final  . RDW 06/01/2015 15.8* 11.5 - 14.5 % Final  . Platelets 06/01/2015 151  150 - 440 K/uL Final  . Neutrophils Relative % 06/01/2015 43   Final  . Neutro Abs 06/01/2015 0.6* 1.4 - 6.5 K/uL Final  . Lymphocytes Relative 06/01/2015 47   Final  . Lymphs Abs 06/01/2015 0.7* 1.0 - 3.6 K/uL Final  . Monocytes Relative 06/01/2015 6   Final  . Monocytes Absolute 06/01/2015 0.1* 0.2 - 0.9 K/uL Final  . Eosinophils Relative 06/01/2015  2   Final  . Eosinophils Absolute 06/01/2015 0.0  0 - 0.7 K/uL Final  . Basophils Relative 06/01/2015 2   Final  . Basophils Absolute 06/01/2015 0.0  0 - 0.1 K/uL Final  . Sodium 06/01/2015 139  135 - 145 mmol/L Final  . Potassium 06/01/2015 4.3  3.5 - 5.1 mmol/L Final  . Chloride 06/01/2015 105  101 - 111 mmol/L Final  . CO2 06/01/2015 28  22 - 32 mmol/L Final  . Glucose, Bld 06/01/2015 81  65 - 99 mg/dL Final  . BUN 06/01/2015 13  6 - 20 mg/dL Final  . Creatinine, Ser 06/01/2015 0.75  0.44 - 1.00 mg/dL Final  . Calcium 06/01/2015 8.9  8.9 - 10.3 mg/dL Final  . Total Protein 06/01/2015 7.2  6.5 - 8.1 g/dL Final  . Albumin 06/01/2015 4.3  3.5 - 5.0 g/dL Final  . AST 06/01/2015 18  15 - 41 U/L Final  . ALT 06/01/2015 14  14 - 54 U/L Final  . Alkaline Phosphatase 06/01/2015 55  38 - 126 U/L Final  . Total Bilirubin 06/01/2015 0.4  0.3 - 1.2 mg/dL Final  . GFR calc non Af Amer 06/01/2015 >60  >60 mL/min  Final  . GFR calc Af Amer 06/01/2015 >60  >60 mL/min Final   Comment: (NOTE) The eGFR has been calculated using the CKD EPI equation. This calculation has not been validated in all clinical situations. eGFR's persistently <60 mL/min signify possible Chronic Kidney Disease.   . Anion gap 06/01/2015 6  5 - 15 Final  . 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 Overall disease appears to be improving based on tumor marker symptoms and review PET scan   MEDICAL DECISION MAKING:  All lab data has been reviewed.  Tumor markers was gradually declining trend. PET scan has been reviewed independently.  Overall shows improvement Continue present dose of IBRANCE (100 mg daily for 3 weeks from week off) and  LETRAZOL . Continue XGEVA Tumor marker shows declining trend  Patient expressed understanding and was in agreement with this plan. She also understands that She can call clinic at any time with any questions, concerns, or complaints.    Cancer of breast   Staging form: Breast, AJCC 7th Edition     Clinical: Stage IV (T1b, N0, M1) - Signed by Forest Gleason, MD on 05/09/2015   Forest Gleason, MD   06/04/2015 9:32 AM

## 2015-06-28 ENCOUNTER — Other Ambulatory Visit: Payer: Self-pay | Admitting: *Deleted

## 2015-06-28 DIAGNOSIS — C50911 Malignant neoplasm of unspecified site of right female breast: Secondary | ICD-10-CM

## 2015-06-29 ENCOUNTER — Inpatient Hospital Stay: Payer: BLUE CROSS/BLUE SHIELD

## 2015-06-29 ENCOUNTER — Encounter: Payer: Self-pay | Admitting: Oncology

## 2015-06-29 ENCOUNTER — Inpatient Hospital Stay: Payer: BLUE CROSS/BLUE SHIELD | Attending: Oncology | Admitting: Oncology

## 2015-06-29 VITALS — BP 135/76 | HR 64 | Temp 96.8°F | Wt 145.3 lb

## 2015-06-29 DIAGNOSIS — Z923 Personal history of irradiation: Secondary | ICD-10-CM | POA: Diagnosis not present

## 2015-06-29 DIAGNOSIS — C7951 Secondary malignant neoplasm of bone: Secondary | ICD-10-CM

## 2015-06-29 DIAGNOSIS — Z79818 Long term (current) use of other agents affecting estrogen receptors and estrogen levels: Secondary | ICD-10-CM | POA: Diagnosis not present

## 2015-06-29 DIAGNOSIS — Z170 Estrogen receptor positive status [ER+]: Secondary | ICD-10-CM | POA: Diagnosis not present

## 2015-06-29 DIAGNOSIS — T451X5S Adverse effect of antineoplastic and immunosuppressive drugs, sequela: Secondary | ICD-10-CM

## 2015-06-29 DIAGNOSIS — C50911 Malignant neoplasm of unspecified site of right female breast: Secondary | ICD-10-CM

## 2015-06-29 DIAGNOSIS — D702 Other drug-induced agranulocytosis: Secondary | ICD-10-CM | POA: Diagnosis not present

## 2015-06-29 DIAGNOSIS — Z79899 Other long term (current) drug therapy: Secondary | ICD-10-CM | POA: Insufficient documentation

## 2015-06-29 DIAGNOSIS — M25551 Pain in right hip: Secondary | ICD-10-CM | POA: Insufficient documentation

## 2015-06-29 DIAGNOSIS — C50811 Malignant neoplasm of overlapping sites of right female breast: Secondary | ICD-10-CM | POA: Diagnosis present

## 2015-06-29 LAB — COMPREHENSIVE METABOLIC PANEL
ALK PHOS: 54 U/L (ref 38–126)
ALT: 12 U/L — ABNORMAL LOW (ref 14–54)
ANION GAP: 4 — AB (ref 5–15)
AST: 18 U/L (ref 15–41)
Albumin: 4.2 g/dL (ref 3.5–5.0)
BUN: 15 mg/dL (ref 6–20)
CALCIUM: 8.5 mg/dL — AB (ref 8.9–10.3)
CO2: 28 mmol/L (ref 22–32)
CREATININE: 0.81 mg/dL (ref 0.44–1.00)
Chloride: 103 mmol/L (ref 101–111)
GFR calc Af Amer: >60 mL/min (ref >60)
GFR calc non Af Amer: >60 mL/min (ref >60)
Glucose, Bld: 106 mg/dL — ABNORMAL HIGH (ref 65–99)
Potassium: 4 mmol/L (ref 3.5–5.1)
SODIUM: 135 mmol/L (ref 135–145)
TOTAL PROTEIN: 7 g/dL (ref 6.5–8.1)
Total Bilirubin: 0.6 mg/dL (ref 0.3–1.2)

## 2015-06-29 LAB — CBC WITH DIFFERENTIAL/PLATELET
BASOS ABS: 0 10*3/uL (ref 0–0.1)
BASOS PCT: 2 %
EOS PCT: 1 %
Eosinophils Absolute: 0 10*3/uL (ref 0–0.7)
HEMATOCRIT: 31.3 % — AB (ref 35.0–47.0)
Hemoglobin: 10.6 g/dL — ABNORMAL LOW (ref 12.0–16.0)
LYMPHS ABS: 0.6 10*3/uL — AB (ref 1.0–3.6)
Lymphocytes Relative: 41 %
MCH: 36 pg — ABNORMAL HIGH (ref 26.0–34.0)
MCHC: 33.9 g/dL (ref 32.0–36.0)
MCV: 106.2 fL — ABNORMAL HIGH (ref 80.0–100.0)
Monocytes Absolute: 0.1 10*3/uL — ABNORMAL LOW (ref 0.2–0.9)
Monocytes Relative: 6 %
NEUTROS ABS: 0.8 10*3/uL — AB (ref 1.4–6.5)
NEUTROS PCT: 50 %
Platelets: 152 10*3/uL (ref 150–440)
RBC: 2.95 MIL/uL — ABNORMAL LOW (ref 3.80–5.20)
RDW: 14.9 % — ABNORMAL HIGH (ref 11.5–14.5)
WBC: 1.6 10*3/uL — ABNORMAL LOW (ref 3.6–11.0)

## 2015-06-29 MED ORDER — DENOSUMAB 120 MG/1.7ML ~~LOC~~ SOLN
120.0000 mg | Freq: Once | SUBCUTANEOUS | Status: AC
  Administered 2015-06-29: 120 mg via SUBCUTANEOUS
  Filled 2015-06-29: qty 1.7

## 2015-06-29 NOTE — Progress Notes (Signed)
[place] @ [facility] [phone]) [phone]  Fax:(336) [place] OB: [birth year]  MR#: [id number]  KPV#:[id number]  Patient Care Team: Gerilyn Pilgrim, FNP as PCP - General (Nurse Practitioner)  CHIEF COMPLAINT:  Chief Complaint  Patient presents with  . Follow-up    Oncology History   1carcinoma of breast biopsy done from the mass at 9:00 position (right breast) at [facility] on October 20, 2014.   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  Estrogen receptor +94%.  Progesterone receptor +95%.  HER-2/neu receptor -0. 2.  X-rays of the lumbar spine done in now September of 2015 revealed diffuse sclerotic bone lesion. 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  Liver is normal.  There are multiple sub-centimeter porta hepatis and pelvic and inguinal lymphadenopathy 4.  Recent has received 2 weeks of radiation therapy to the right hip at [facility] 5.  Has been started on letrozole 3 weeks ago (November 22, 2014) 6.recent has been switched to [facility] and letrozole 2 weeks ago starting from January 13 (IBRANCE 125 mg by mouth for 3 weeks with one week off ) dose of IBRANCE has been reduced 100 mg because of neutropenia     Cancer of breast   05/04/2015 Initial Diagnosis Cancer of breast    Oncology Flowsheet 05/04/2015 06/01/2015  denosumab (XGEVA) Martin 120 mg 120 mg    INTERVAL HISTORY:  59 year old lady came today for further follow-up regarding stage IV carcinoma breast.  Patient is taking IBRANCE, letrozole Also getting XGEVA.  No bony pain appetite has been fairly good.  Ambulation is improved.  No nausea.  No vomiting.  No diarrhea.. June, 2016 she is here for ongoing evaluation and had a repeat PET scan and plain x-rays.  Bony pains are improved. June 29, 2015 Patient is here for ongoing evaluation and treatment consideration.  Significant improvement in general condition.  However practice patient continues to have pain menses trying to do any exercise pain is located in the right hip area.  No chills fever appetite has been stable.  Tumor markers shows continuing decline.  REVIEW OF SYSTEMS:   GENERAL:  Feels good.  Active.  No fevers, sweats or weight loss. PERFORMANCE STATUS (ECOG):0 HEENT:  No visual changes, runny nose, sore throat, mouth sores or tenderness. Lungs: No shortness of breath or cough.  No hemoptysis. Cardiac:  No chest pain, palpitations, orthopnea, or PND. GI:  No nausea, vomiting, diarrhea, constipation, melena or hematochezia. GU:  No urgency, frequency, dysuria, or hematuria. Musculoskeletal:  No back pain.  No joint pain.  No muscle tenderness. Extremities:  No pain or swelling. Skin:  No rashes or skin changes. Neuro:  No headache, numbness or weakness, balance or coordination issues. Endocrine:  No diabetes, thyroid issues, hot flashes or night sweats. Psych:  No mood changes, depression or anxiety. Pain:  No focal pain. Review of systems:  All other systems reviewed and found to be negative.  As per HPI. Otherwise, a complete review of systems is negatve.  PAST MEDICAL HISTORY: Past Medical History  Diagnosis Date  . Cancer of breast 05/04/2015    PAST SURGICAL HISTORY: No past surgical history on file.  FAMILY HISTORY No family history on file. Preventive Screening:  Has patient had any of the following test? Colonscopy  Mammography  Pap Smear (1)   Last Colonoscopy: Never(1)   Last Mammography: 2015(1)   Last Pap Smear: Nov 2015(1)   Smoking History: Smoking History Never Smoked.(1)  PFSH: Family History: noncontributory  Social History: negative alcohol, negative tobacco  Additional Past Medical and Surgical History: no other major medical illness  ECOLOGIC HISTORY:  No LMP recorded  (approximate). Patient is postmenopausal.     ADVANCED DIRECTIVES: Patient does not have any advanced healthcare directive. Information has been given.   HEALTH MAINTENANCE: History  Substance Use Topics  . Smoking status: Never Smoker   . Smokeless tobacco: Not on file  . Alcohol Use: Not on file      Allergies  Allergen Reactions  . Beef Extract Anaphylaxis  . Pork (Porcine) Protein Anaphylaxis  . Beef-Derived Products Hives    Current Outpatient Prescriptions  Medication Sig Dispense Refill  . Ascorbic Acid (VITAMIN C) 100 MG tablet Take 100 mg by mouth daily.    . calcium-vitamin D (OSCAL WITH D) 500-200 MG-UNIT per tablet Take 1 tablet by mouth 3 (three) times daily.    Marland Kitchen letrozole (FEMARA) 2.5 MG tablet Take 2.5 mg by mouth.    . Multiple Vitamins-Minerals (MULTIVITAMIN WITH MINERALS) tablet Take 1 tablet by mouth daily.    . palbociclib (IBRANCE) 100 MG capsule Take 125 mg by mouth daily.     No current facility-administered medications for this visit.    OBJECTIVE:  Filed Vitals:   06/29/15 1017  BP: 135/76  Pulse: 64  Temp: 96.8 F (36 C)     Body mass index is 27.47 kg/(m^2).    ECOG FS:0 - Asymptomatic  PHYSICAL EXAM: GENERAL:  Well developed, well nourished, sitting comfortably in the exam room in no acute distress. MENTAL STATUS:  Alert and oriented to person, place and time. HEAD:   Normocephalic, atraumatic, face symmetric, no Cushingoid features. EYES:    Pupils equal round and reactive to light and accomodation.  No conjunctivitis or scleral icterus. ENT:  Oropharynx clear without lesion.  Tongue normal. Mucous membranes moist.  RESPIRATORY:  Clear to auscultation without rales, wheezes or rhonchi. CARDIOVASCULAR:  Regular rate and rhythm without murmur, rub or gallop. BREAST:  Right breast : Palpable mass is decreased in size. NO skin changes or nipple discharge.  Left breast without masses, skin changes or nipple discharge. ABDOMEN:  Soft,  non-tender, with active bowel sounds, and no hepatosplenomegaly.  No masses. BACK:  No CVA tenderness.  No tenderness on percussion of the back or rib cage. SKIN:  No rashes, ulcers or lesions. EXTREMITIES: No edema, no skin discoloration or tenderness.  No palpable cords. LYMPH NODES: No palpable cervical, supraclavicular, axillary or inguinal adenopathy  NEUROLOGICAL: Unremarkable. PSYCH:  Appropriate.   LAB RESULTS:  Appointment on 06/29/2015  Component Date Value Ref Range Status  . WBC 06/29/2015 1.6* 3.6 - 11.0 K/uL Final  . RBC 06/29/2015 2.95* 3.80 - 5.20 MIL/uL Final  . Hemoglobin 06/29/2015 10.6* 12.0 - 16.0 g/dL Final  . HCT 06/29/2015 31.3* 35.0 - 47.0 % Final  . MCV 06/29/2015 106.2* 80.0 - 100.0 fL Final  . MCH 06/29/2015 36.0* 26.0 - 34.0 pg Final  . MCHC 06/29/2015 33.9  32.0 - 36.0 g/dL Final  . RDW 06/29/2015 14.9* 11.5 - 14.5 % Final  . Platelets 06/29/2015 152  150 - 440 K/uL Final  . Neutrophils Relative % 06/29/2015 50   Final  . Neutro Abs 06/29/2015 0.8* 1.4 - 6.5 K/uL Final  . Lymphocytes  Relative 06/29/2015 41   Final  . Lymphs Abs 06/29/2015 0.6* 1.0 - 3.6 K/uL Final  . Monocytes Relative 06/29/2015 6   Final  . Monocytes Absolute 06/29/2015 0.1* 0.2 - 0.9 K/uL Final  . Eosinophils Relative 06/29/2015 1   Final  . Eosinophils Absolute 06/29/2015 0.0  0 - 0.7 K/uL Final  . Basophils Relative 06/29/2015 2   Final  . Basophils Absolute 06/29/2015 0.0  0 - 0.1 K/uL Final  . Sodium 06/29/2015 135  135 - 145 mmol/L Final  . Potassium 06/29/2015 4.0  3.5 - 5.1 mmol/L Final  . Chloride 06/29/2015 103  101 - 111 mmol/L Final  . CO2 06/29/2015 28  22 - 32 mmol/L Final  . Glucose, Bld 06/29/2015 106* 65 - 99 mg/dL Final  . BUN 06/29/2015 15  6 - 20 mg/dL Final  . Creatinine, Ser 06/29/2015 0.81  0.44 - 1.00 mg/dL Final  . Calcium 06/29/2015 8.5* 8.9 - 10.3 mg/dL Final  . Total Protein 06/29/2015 7.0  6.5 - 8.1 g/dL Final  . Albumin 06/29/2015 4.2  3.5 - 5.0  g/dL Final  . AST 06/29/2015 18  15 - 41 U/L Final  . ALT 06/29/2015 12* 14 - 54 U/L Final  . Alkaline Phosphatase 06/29/2015 54  38 - 126 U/L Final  . Total Bilirubin 06/29/2015 0.6  0.3 - 1.2 mg/dL Final  . GFR calc non Af Amer 06/29/2015 >60  >60 mL/min Final  . GFR calc Af Amer 06/29/2015 >60  >60 mL/min Final   Comment: (NOTE) The eGFR has been calculated using the CKD EPI equation. This calculation has not been validated in all clinical situations. eGFR's persistently <60 mL/min signify possible Chronic Kidney Disease.   . 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 Neutropenia but no fever. Tumor markers are slowly declining.   MEDICAL DECISION MAKING:  All lab data has been reviewed.  Tumor markers was gradually declining trend. PET scan has been reviewed independently.  Overall shows improvement Continue present dose of IBRANCE (100 mg daily for 3 weeks from week off) and  LETRAZOL . Continue XGEVA Tumor marker shows declining trend She was offered over care program or physiotherapy Lites were moderate exercise considering bone metastases needs to be considered.  Patient was again asked not to get involved into heavy exercises for time being.  Patient expressed understanding and was in agreement with this plan. She also understands that She can call clinic at any time with any questions, concerns, or complaints.    Cancer of breast   Staging form: Breast, AJCC 7th Edition     Clinical: Stage IV (T1b, N0, M1) - Signed by Forest Gleason, MD on 05/09/2015   Forest Gleason, MD   06/29/2015 10:44 AM

## 2015-06-29 NOTE — Progress Notes (Signed)
Patient does not have living will.  Never smoked. 

## 2015-06-30 LAB — CANCER ANTIGEN 27.29: CA 27.29: 91.1 U/mL — ABNORMAL HIGH (ref 0.0–38.6)

## 2015-07-27 ENCOUNTER — Inpatient Hospital Stay: Payer: BLUE CROSS/BLUE SHIELD | Attending: Oncology

## 2015-07-27 ENCOUNTER — Inpatient Hospital Stay (HOSPITAL_BASED_OUTPATIENT_CLINIC_OR_DEPARTMENT_OTHER): Payer: BLUE CROSS/BLUE SHIELD | Admitting: Oncology

## 2015-07-27 ENCOUNTER — Inpatient Hospital Stay: Payer: BLUE CROSS/BLUE SHIELD

## 2015-07-27 VITALS — BP 142/73 | HR 69 | Temp 97.9°F | Wt 146.1 lb

## 2015-07-27 DIAGNOSIS — T451X5S Adverse effect of antineoplastic and immunosuppressive drugs, sequela: Secondary | ICD-10-CM

## 2015-07-27 DIAGNOSIS — D702 Other drug-induced agranulocytosis: Secondary | ICD-10-CM

## 2015-07-27 DIAGNOSIS — C7951 Secondary malignant neoplasm of bone: Secondary | ICD-10-CM | POA: Diagnosis not present

## 2015-07-27 DIAGNOSIS — Z79818 Long term (current) use of other agents affecting estrogen receptors and estrogen levels: Secondary | ICD-10-CM | POA: Diagnosis not present

## 2015-07-27 DIAGNOSIS — Z170 Estrogen receptor positive status [ER+]: Secondary | ICD-10-CM

## 2015-07-27 DIAGNOSIS — Z923 Personal history of irradiation: Secondary | ICD-10-CM | POA: Diagnosis not present

## 2015-07-27 DIAGNOSIS — C50811 Malignant neoplasm of overlapping sites of right female breast: Secondary | ICD-10-CM

## 2015-07-27 DIAGNOSIS — C50911 Malignant neoplasm of unspecified site of right female breast: Secondary | ICD-10-CM

## 2015-07-27 DIAGNOSIS — Z79899 Other long term (current) drug therapy: Secondary | ICD-10-CM

## 2015-07-27 LAB — COMPREHENSIVE METABOLIC PANEL
ALK PHOS: 42 U/L (ref 38–126)
ALT: 12 U/L — AB (ref 14–54)
AST: 15 U/L (ref 15–41)
Albumin: 4 g/dL (ref 3.5–5.0)
Anion gap: 4 — ABNORMAL LOW (ref 5–15)
BUN: 16 mg/dL (ref 6–20)
CALCIUM: 8.8 mg/dL — AB (ref 8.9–10.3)
CO2: 28 mmol/L (ref 22–32)
CREATININE: 0.86 mg/dL (ref 0.44–1.00)
Chloride: 108 mmol/L (ref 101–111)
GFR calc non Af Amer: >60 mL/min (ref >60)
Glucose, Bld: 95 mg/dL (ref 65–99)
Potassium: 4.1 mmol/L (ref 3.5–5.1)
Sodium: 140 mmol/L (ref 135–145)
TOTAL PROTEIN: 6.9 g/dL (ref 6.5–8.1)
Total Bilirubin: 0.5 mg/dL (ref 0.3–1.2)

## 2015-07-27 LAB — CBC WITH DIFFERENTIAL/PLATELET
Basophils Absolute: 0 10*3/uL (ref 0–0.1)
Basophils Relative: 1 %
EOS PCT: 1 %
Eosinophils Absolute: 0 10*3/uL (ref 0–0.7)
HCT: 32.2 % — ABNORMAL LOW (ref 35.0–47.0)
HEMOGLOBIN: 11.1 g/dL — AB (ref 12.0–16.0)
LYMPHS ABS: 0.7 10*3/uL — AB (ref 1.0–3.6)
LYMPHS PCT: 43 %
MCH: 36.1 pg — AB (ref 26.0–34.0)
MCHC: 34.6 g/dL (ref 32.0–36.0)
MCV: 104.3 fL — ABNORMAL HIGH (ref 80.0–100.0)
Monocytes Absolute: 0.1 10*3/uL — ABNORMAL LOW (ref 0.2–0.9)
Monocytes Relative: 6 %
Neutro Abs: 0.8 10*3/uL — ABNORMAL LOW (ref 1.4–6.5)
Neutrophils Relative %: 49 %
PLATELETS: 149 10*3/uL — AB (ref 150–440)
RBC: 3.09 MIL/uL — AB (ref 3.80–5.20)
RDW: 15.1 % — ABNORMAL HIGH (ref 11.5–14.5)
WBC: 1.6 10*3/uL — AB (ref 3.6–11.0)

## 2015-07-27 MED ORDER — DENOSUMAB 120 MG/1.7ML ~~LOC~~ SOLN
120.0000 mg | Freq: Once | SUBCUTANEOUS | Status: AC
  Administered 2015-07-27: 120 mg via SUBCUTANEOUS
  Filled 2015-07-27: qty 1.7

## 2015-07-27 NOTE — Progress Notes (Signed)
Patient does not have living will. Never smoked. Patient states she has a sinus infection today - having headache above left eye.

## 2015-07-28 LAB — CANCER ANTIGEN 27.29: CA 27.29: 72.5 U/mL — AB (ref 0.0–38.6)

## 2015-08-07 ENCOUNTER — Encounter: Payer: Self-pay | Admitting: Oncology

## 2015-08-07 NOTE — Progress Notes (Signed)
[place] @ [facility] [phone]  Fax:(336) [place] OB: 10/13/1956  MR#: [id number]  JYN#:[id number]  Patient Care Team: Gerilyn Pilgrim, FNP as PCP - General (Nurse Practitioner)  CHIEF COMPLAINT:  Chief Complaint  Patient presents with  . Follow-up   Oncology History   1carcinoma of breast biopsy done from the mass at 9:00 position (right breast) at [facility] on October 20, 2014.   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  Estrogen receptor +94%.  Progesterone receptor +95%.  HER-2/neu receptor -0. 2.  X-rays of the lumbar spine done in now September of 2015 revealed diffuse sclerotic bone lesion. 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  Liver is normal.  There are multiple sub-centimeter porta hepatis and pelvic and inguinal lymphadenopathy 4.  Recent has received 2 weeks of radiation therapy to the right hip at [facility] 5.  Has been started on letrozole 3 weeks ago (November 22, 2014) 6.recent has been switched to [facility] and letrozole 2 weeks ago starting from January 13 (IBRANCE 125 mg by mouth for 3 weeks with one week off ) dose of IBRANCE has been reduced 100 mg because of neutropenia      Oncology Flowsheet 05/04/2015 06/01/2015 06/29/2015 07/27/2015  denosumab (XGEVA) Treutlen 120 mg 120 mg 120 mg 120 mg    INTERVAL HISTORY:  59 year old lady came today for further follow-up regarding stage IV carcinoma breast.  Patient is taking IBRANCE, letrozole Also getting XGEVA.  No bony pain appetite has been fairly good.  Ambulation is improved.  No nausea.  No vomiting.  No diarrhea.. June, 2016 she is here for ongoing evaluation and had a repeat PET scan and plain x-rays.  Bony pains are improved. June 29, 2015 Patient is here for ongoing  evaluation and treatment consideration.  Significant improvement in general condition.  However practice patient continues to have pain menses trying to do any exercise pain is located in the right hip area.  No chills fever appetite has been stable.  Tumor markers shows continuing decline. August, 2016 Patient is here for stage IV carcinoma of breast.  Tolerating IBRANCE and letrozole.  Fatigue is the main complaint.  Tumor markers had gradually declining bony pains have improved.  No nausea.  No vomiting.  No diarrhea.  REVIEW OF SYSTEMS:   GENERAL:  Feels good.  Active.  No fevers, sweats or weight loss. PERFORMANCE STATUS (ECOG):0 HEENT:  No visual changes, runny nose, sore throat, mouth sores or tenderness. Lungs: No shortness of breath or cough.  No hemoptysis. Cardiac:  No chest pain, palpitations, orthopnea, or PND. GI:  No nausea, vomiting, diarrhea, constipation, melena or hematochezia. GU:  No urgency, frequency, dysuria, or hematuria. Musculoskeletal:  No back pain.  No joint pain.  No muscle tenderness. Extremities:  No pain or swelling. Skin:  No rashes or skin changes. Neuro:  No headache, numbness or weakness, balance or coordination issues. Endocrine:  No diabetes, thyroid issues, hot flashes or night sweats. Psych:  No mood changes, depression or anxiety. Pain:  No focal pain. Review of systems:  All other systems reviewed and found to be negative.  As per HPI. Otherwise, a complete review of systems is negatve.  PAST MEDICAL HISTORY: Past Medical History  Diagnosis Date  . Cancer of breast 05/04/2015  PAST SURGICAL HISTORY: No past surgical history on file.  FAMILY HISTORY No family history on file. Preventive Screening:  Has patient had any of the following test? Colonscopy  Mammography  Pap Smear (1)   Last Colonoscopy: Never(1)   Last Mammography: 2015(1)   Last Pap Smear: Nov 2015(1)   Smoking History: Smoking History Never  Smoked.(1)  PFSH: Family History: noncontributory  Social History: negative alcohol, negative tobacco  Additional Past Medical and Surgical History: no other major medical illness  ECOLOGIC HISTORY:  No LMP recorded (approximate). Patient is postmenopausal.     ADVANCED DIRECTIVES: Patient does not have any advanced healthcare directive. Information has been given.   HEALTH MAINTENANCE: Social History  Substance Use Topics  . Smoking status: Never Smoker   . Smokeless tobacco: None  . Alcohol Use: None      Allergies  Allergen Reactions  . Beef Extract Anaphylaxis  . Pork (Porcine) Protein Anaphylaxis  . Beef-Derived Products Hives    Current Outpatient Prescriptions  Medication Sig Dispense Refill  . Ascorbic Acid (VITAMIN C) 100 MG tablet Take 100 mg by mouth daily.    . calcium-vitamin D (OSCAL WITH D) 500-200 MG-UNIT per tablet Take 1 tablet by mouth 3 (three) times daily.    Marland Kitchen letrozole (FEMARA) 2.5 MG tablet Take 2.5 mg by mouth.    . Multiple Vitamins-Minerals (MULTIVITAMIN WITH MINERALS) tablet Take 1 tablet by mouth daily.    . palbociclib (IBRANCE) 100 MG capsule Take 125 mg by mouth daily.     No current facility-administered medications for this visit.    OBJECTIVE:  Filed Vitals:   07/27/15 1042  BP: 142/73  Pulse: 69  Temp: 97.9 F (36.6 C)     Body mass index is 27.62 kg/(m^2).    ECOG FS:0 - Asymptomatic  PHYSICAL EXAM: GENERAL:  Well developed, well nourished, sitting comfortably in the exam room in no acute distress. MENTAL STATUS:  Alert and oriented to person, place and time. HEAD:   Normocephalic, atraumatic, face symmetric, no Cushingoid features. EYES:    Pupils equal round and reactive to light and accomodation.  No conjunctivitis or scleral icterus. ENT:  Oropharynx clear without lesion.  Tongue normal. Mucous membranes moist.  RESPIRATORY:  Clear to auscultation without rales, wheezes or rhonchi. CARDIOVASCULAR:  Regular rate and  rhythm without murmur, rub or gallop. BREAST:  Right breast : Palpable mass is decreased in size. NO skin changes or nipple discharge.  Left breast without masses, skin changes or nipple discharge. ABDOMEN:  Soft, non-tender, with active bowel sounds, and no hepatosplenomegaly.  No masses. BACK:  No CVA tenderness.  No tenderness on percussion of the back or rib cage. SKIN:  No rashes, ulcers or lesions. EXTREMITIES: No edema, no skin discoloration or tenderness.  No palpable cords. LYMPH NODES: No palpable cervical, supraclavicular, axillary or inguinal adenopathy  NEUROLOGICAL: Unremarkable. PSYCH:  Appropriate.   LAB RESULTS:  Appointment on 07/27/2015  Component Date Value Ref Range Status  . WBC 07/27/2015 1.6* 3.6 - 11.0 K/uL Final  . RBC 07/27/2015 3.09* 3.80 - 5.20 MIL/uL Final  . Hemoglobin 07/27/2015 11.1* 12.0 - 16.0 g/dL Final  . HCT 07/27/2015 32.2* 35.0 - 47.0 % Final  . MCV 07/27/2015 104.3* 80.0 - 100.0 fL Final  . MCH 07/27/2015 36.1* 26.0 - 34.0 pg Final  . MCHC 07/27/2015 34.6  32.0 - 36.0 g/dL Final  . RDW 07/27/2015 15.1* 11.5 - 14.5 % Final  . Platelets 07/27/2015 149* 150 - 440  K/uL Final  . Neutrophils Relative % 07/27/2015 49   Final  . Neutro Abs 07/27/2015 0.8* 1.4 - 6.5 K/uL Final  . Lymphocytes Relative 07/27/2015 43   Final  . Lymphs Abs 07/27/2015 0.7* 1.0 - 3.6 K/uL Final  . Monocytes Relative 07/27/2015 6   Final  . Monocytes Absolute 07/27/2015 0.1* 0.2 - 0.9 K/uL Final  . Eosinophils Relative 07/27/2015 1   Final  . Eosinophils Absolute 07/27/2015 0.0  0 - 0.7 K/uL Final  . Basophils Relative 07/27/2015 1   Final  . Basophils Absolute 07/27/2015 0.0  0 - 0.1 K/uL Final  . Sodium 07/27/2015 140  135 - 145 mmol/L Final  . Potassium 07/27/2015 4.1  3.5 - 5.1 mmol/L Final  . Chloride 07/27/2015 108  101 - 111 mmol/L Final  . CO2 07/27/2015 28  22 - 32 mmol/L Final  . Glucose, Bld 07/27/2015 95  65 - 99 mg/dL Final  . BUN 07/27/2015 16  6 - 20  mg/dL Final  . Creatinine, Ser 07/27/2015 0.86  0.44 - 1.00 mg/dL Final  . Calcium 07/27/2015 8.8* 8.9 - 10.3 mg/dL Final  . Total Protein 07/27/2015 6.9  6.5 - 8.1 g/dL Final  . Albumin 07/27/2015 4.0  3.5 - 5.0 g/dL Final  . AST 07/27/2015 15  15 - 41 U/L Final  . ALT 07/27/2015 12* 14 - 54 U/L Final  . Alkaline Phosphatase 07/27/2015 42  38 - 126 U/L Final  . Total Bilirubin 07/27/2015 0.5  0.3 - 1.2 mg/dL Final  . GFR calc non Af Amer 07/27/2015 >60  >60 mL/min Final  . GFR calc Af Amer 07/27/2015 >60  >60 mL/min Final   Comment: (NOTE) The eGFR has been calculated using the CKD EPI equation. This calculation has not been validated in all clinical situations. eGFR's persistently <60 mL/min signify possible Chronic Kidney Disease.   . Anion gap 07/27/2015 4* 5 - 15 Final  . 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 Neutropenia but no fever. Tumor markers are slowly declining.   MEDICAL DECISION MAKING:  Lab data has been reviewed.\Tumor markers are slowly declining  Continue IBRANCE and letrozole Myelosuppression secondary to [facility] but no evidence of any infection or fever. Patient  is myelosuppressed secondary to chemotherapy. Was advised to call me if spikes fever more than 100.  Or any chills or fever.  Patient expressed understanding and was in agreement  with this plan. She also understands that She can call clinic at any time with any questions, concerns, or complaints.    Cancer of breast   Staging form: Breast, AJCC 7th Edition     Clinical: Stage IV (T1b, N0, M1) - Signed by Forest Gleason, MD on 05/09/2015   Forest Gleason, MD   08/07/2015 9:01 PM

## 2015-08-24 ENCOUNTER — Inpatient Hospital Stay (HOSPITAL_BASED_OUTPATIENT_CLINIC_OR_DEPARTMENT_OTHER): Payer: BLUE CROSS/BLUE SHIELD | Admitting: Oncology

## 2015-08-24 ENCOUNTER — Encounter: Payer: Self-pay | Admitting: Oncology

## 2015-08-24 ENCOUNTER — Inpatient Hospital Stay: Payer: BLUE CROSS/BLUE SHIELD | Attending: Oncology

## 2015-08-24 ENCOUNTER — Inpatient Hospital Stay: Payer: BLUE CROSS/BLUE SHIELD

## 2015-08-24 VITALS — BP 134/82 | HR 56 | Temp 98.2°F | Wt 145.5 lb

## 2015-08-24 DIAGNOSIS — R5383 Other fatigue: Secondary | ICD-10-CM

## 2015-08-24 DIAGNOSIS — D702 Other drug-induced agranulocytosis: Secondary | ICD-10-CM

## 2015-08-24 DIAGNOSIS — Z79818 Long term (current) use of other agents affecting estrogen receptors and estrogen levels: Secondary | ICD-10-CM

## 2015-08-24 DIAGNOSIS — Z79899 Other long term (current) drug therapy: Secondary | ICD-10-CM | POA: Diagnosis not present

## 2015-08-24 DIAGNOSIS — C50811 Malignant neoplasm of overlapping sites of right female breast: Secondary | ICD-10-CM | POA: Diagnosis not present

## 2015-08-24 DIAGNOSIS — T451X5S Adverse effect of antineoplastic and immunosuppressive drugs, sequela: Secondary | ICD-10-CM | POA: Insufficient documentation

## 2015-08-24 DIAGNOSIS — C7951 Secondary malignant neoplasm of bone: Secondary | ICD-10-CM | POA: Diagnosis not present

## 2015-08-24 DIAGNOSIS — Z170 Estrogen receptor positive status [ER+]: Secondary | ICD-10-CM | POA: Diagnosis not present

## 2015-08-24 DIAGNOSIS — C50911 Malignant neoplasm of unspecified site of right female breast: Secondary | ICD-10-CM

## 2015-08-24 LAB — COMPREHENSIVE METABOLIC PANEL
ALBUMIN: 4.5 g/dL (ref 3.5–5.0)
ALT: 13 U/L — ABNORMAL LOW (ref 14–54)
ANION GAP: 8 (ref 5–15)
AST: 23 U/L (ref 15–41)
Alkaline Phosphatase: 57 U/L (ref 38–126)
BUN: 13 mg/dL (ref 6–20)
CHLORIDE: 105 mmol/L (ref 101–111)
CO2: 28 mmol/L (ref 22–32)
Calcium: 9.3 mg/dL (ref 8.9–10.3)
Creatinine, Ser: 0.78 mg/dL (ref 0.44–1.00)
GFR calc Af Amer: >60 mL/min (ref >60)
GFR calc non Af Amer: >60 mL/min (ref >60)
GLUCOSE: 93 mg/dL (ref 65–99)
POTASSIUM: 4.2 mmol/L (ref 3.5–5.1)
SODIUM: 141 mmol/L (ref 135–145)
Total Bilirubin: 0.7 mg/dL (ref 0.3–1.2)
Total Protein: 7.4 g/dL (ref 6.5–8.1)

## 2015-08-24 LAB — CBC WITH DIFFERENTIAL/PLATELET
BASOS ABS: 0 10*3/uL (ref 0–0.1)
BASOS PCT: 2 %
EOS ABS: 0 10*3/uL (ref 0–0.7)
Eosinophils Relative: 1 %
HEMATOCRIT: 32.5 % — AB (ref 35.0–47.0)
Hemoglobin: 11.1 g/dL — ABNORMAL LOW (ref 12.0–16.0)
Lymphocytes Relative: 46 %
Lymphs Abs: 0.7 10*3/uL — ABNORMAL LOW (ref 1.0–3.6)
MCH: 35.5 pg — ABNORMAL HIGH (ref 26.0–34.0)
MCHC: 34.1 g/dL (ref 32.0–36.0)
MCV: 104.3 fL — ABNORMAL HIGH (ref 80.0–100.0)
MONO ABS: 0.1 10*3/uL — AB (ref 0.2–0.9)
Monocytes Relative: 6 %
NEUTROS ABS: 0.7 10*3/uL — AB (ref 1.4–6.5)
Neutrophils Relative %: 45 %
PLATELETS: 147 10*3/uL — AB (ref 150–440)
RBC: 3.12 MIL/uL — ABNORMAL LOW (ref 3.80–5.20)
RDW: 14.9 % — AB (ref 11.5–14.5)
WBC: 1.6 10*3/uL — ABNORMAL LOW (ref 3.6–11.0)

## 2015-08-24 MED ORDER — DENOSUMAB 120 MG/1.7ML ~~LOC~~ SOLN
120.0000 mg | Freq: Once | SUBCUTANEOUS | Status: AC
  Administered 2015-08-24: 120 mg via SUBCUTANEOUS
  Filled 2015-08-24: qty 1.7

## 2015-08-25 LAB — CANCER ANTIGEN 27.29: CA 27.29: 81 U/mL — ABNORMAL HIGH (ref 0.0–38.6)

## 2015-08-25 NOTE — Progress Notes (Signed)
[place] @ [facility] [phone]  Fax:([phone]) [place] OB: August 06, 1956  MR#: [id number]  EUM#:[id number]  Patient Care Team: Gerilyn Pilgrim, FNP as PCP - General (Nurse Practitioner)  CHIEF COMPLAINT:  No chief complaint on file.  Oncology History   1carcinoma of breast biopsy done from the mass at 9:00 position (right breast) at [facility] on October 20, 2014.   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 lobular features.  Estrogen receptor +94%.  Progesterone receptor +95%.  HER-2/neu receptor -0. 2.  X-rays of the lumbar spine done in now September of 2015 revealed diffuse sclerotic bone lesion. 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  Liver is normal.  There are multiple sub-centimeter porta hepatis and pelvic and inguinal lymphadenopathy 4.  Recent has received 2 weeks of radiation therapy to the right hip at [facility] 5.  Has been started on letrozole 3 weeks ago (November 22, 2014) 6.recent has been switched to [facility] and letrozole 2 weeks ago starting from January 13 (IBRANCE 125 mg by mouth for 3 weeks with one week off ) dose of IBRANCE has been reduced 100 mg because of neutropenia      Oncology Flowsheet 05/04/2015 06/01/2015 06/29/2015 07/27/2015 08/24/2015  denosumab (XGEVA) [place] 120 mg 120 mg 120 mg 120 mg 120 mg    INTERVAL HISTORY:  59 year old lady came today for further follow-up regarding stage IV carcinoma breast.  Patient is taking IBRANCE, letrozole Also getting XGEVA.  No bony pain appetite has been fairly good.  Ambulation is improved.  No nausea.  No vomiting.  No diarrhea.. June, 2016 she is here for ongoing evaluation and had a repeat PET scan and plain x-rays.  Bony pains are improved. June 29, 2015 Patient is here for ongoing evaluation and  treatment consideration.  Significant improvement in general condition.  However practice patient continues to have pain menses trying to do any exercise pain is located in the right hip area.  No chills fever appetite has been stable.  Tumor markers shows continuing decline. August, 2016 Patient is here for stage IV carcinoma of breast.  Tolerating IBRANCE and letrozole.  Fatigue is the main complaint.  Tumor markers had gradually declining bony pains have improved.  No nausea.  No vomiting.  No diarrhea. September, 2016 Patient is here for ongoing evaluation no bony pain.  Appetite has been stable.  Patient is here for further follow-up regarding carcinoma breast REVIEW OF SYSTEMS:   GENERAL:  Feels good.  Active.  No fevers, sweats or weight loss. PERFORMANCE STATUS (ECOG):0 HEENT:  No visual changes, runny nose, sore throat, mouth sores or tenderness. Lungs: No shortness of breath or cough.  No hemoptysis. Cardiac:  No chest pain, palpitations, orthopnea, or PND. GI:  No nausea, vomiting, diarrhea, constipation, melena or hematochezia. GU:  No urgency, frequency, dysuria, or hematuria. Musculoskeletal:  No back pain.  No joint pain.  No muscle tenderness. Extremities:  No pain or swelling. Skin:  No rashes or skin changes. Neuro:  No headache, numbness or weakness, balance or coordination issues. Endocrine:  No diabetes, thyroid issues, hot flashes or night sweats. Psych:  No mood changes, depression or anxiety. Pain:  No focal pain. Review of systems:  All other systems reviewed and found to be negative.  As per HPI. Otherwise,  a complete review of systems is negatve.  PAST MEDICAL HISTORY: Past Medical History  Diagnosis Date  . Cancer of breast 05/04/2015    PAST SURGICAL HISTORY: History reviewed. No pertinent past surgical history.  FAMILY HISTORY History reviewed. No pertinent family history. Preventive Screening:  Has patient had any of the following test? Colonscopy   Mammography  Pap Smear (1)   Last Colonoscopy: Never(1)   Last Mammography: 2015(1)   Last Pap Smear: Nov 2015(1)   Smoking History: Smoking History Never Smoked.(1)  PFSH: Family History: noncontributory  Social History: negative alcohol, negative tobacco  Additional Past Medical and Surgical History: no other major medical illness  ECOLOGIC HISTORY:  No LMP recorded (approximate). Patient is postmenopausal.     ADVANCED DIRECTIVES: Patient does not have any advanced healthcare directive. Information has been given.   HEALTH MAINTENANCE: Social History  Substance Use Topics  . Smoking status: Never Smoker   . Smokeless tobacco: None  . Alcohol Use: None      Allergies  Allergen Reactions  . Beef Extract Anaphylaxis  . Pork (Porcine) Protein Anaphylaxis  . Beef-Derived Products Hives    Current Outpatient Prescriptions  Medication Sig Dispense Refill  . Ascorbic Acid (VITAMIN C) 100 MG tablet Take 100 mg by mouth daily.    . calcium-vitamin D (OSCAL WITH D) 500-200 MG-UNIT per tablet Take 1 tablet by mouth 3 (three) times daily.    Marland Kitchen letrozole (FEMARA) 2.5 MG tablet Take 2.5 mg by mouth.    . Multiple Vitamins-Minerals (MULTIVITAMIN WITH MINERALS) tablet Take 1 tablet by mouth daily.    . palbociclib (IBRANCE) 100 MG capsule Take 125 mg by mouth daily.     No current facility-administered medications for this visit.    OBJECTIVE:  Filed Vitals:   08/24/15 1121  BP: 134/82  Pulse: 56  Temp: 98.2 F (36.8 C)     Body mass index is 27.51 kg/(m^2).    ECOG FS:0 - Asymptomatic  PHYSICAL EXAM: GENERAL:  Well developed, well nourished, sitting comfortably in the exam room in no acute distress. MENTAL STATUS:  Alert and oriented to person, place and time. HEAD:   Normocephalic, atraumatic, face symmetric, no Cushingoid features. EYES:    Pupils equal round and reactive to light and accomodation.  No conjunctivitis or scleral icterus. ENT:  Oropharynx clear  without lesion.  Tongue normal. Mucous membranes moist.  RESPIRATORY:  Clear to auscultation without rales, wheezes or rhonchi. CARDIOVASCULAR:  Regular rate and rhythm without murmur, rub or gallop. BREAST:  Right breast : Palpable mass is decreased in size. NO skin changes or nipple discharge.  Left breast without masses, skin changes or nipple discharge. ABDOMEN:  Soft, non-tender, with active bowel sounds, and no hepatosplenomegaly.  No masses. BACK:  No CVA tenderness.  No tenderness on percussion of the back or rib cage. SKIN:  No rashes, ulcers or lesions. EXTREMITIES: No edema, no skin discoloration or tenderness.  No palpable cords. LYMPH NODES: No palpable cervical, supraclavicular, axillary or inguinal adenopathy  NEUROLOGICAL: Unremarkable. PSYCH:  Appropriate.   LAB RESULTS:  Appointment on 08/24/2015  Component Date Value Ref Range Status  . WBC 08/24/2015 1.6* 3.6 - 11.0 K/uL Final  . RBC 08/24/2015 3.12* 3.80 - 5.20 MIL/uL Final  . Hemoglobin 08/24/2015 11.1* 12.0 - 16.0 g/dL Final  . HCT 08/24/2015 32.5* 35.0 - 47.0 % Final  . MCV 08/24/2015 104.3* 80.0 - 100.0 fL Final  . MCH 08/24/2015 35.5* 26.0 - 34.0 pg Final  .  MCHC 08/24/2015 34.1  32.0 - 36.0 g/dL Final  . RDW 08/24/2015 14.9* 11.5 - 14.5 % Final  . Platelets 08/24/2015 147* 150 - 440 K/uL Final  . Neutrophils Relative % 08/24/2015 45   Final  . Neutro Abs 08/24/2015 0.7* 1.4 - 6.5 K/uL Final  . Lymphocytes Relative 08/24/2015 46   Final  . Lymphs Abs 08/24/2015 0.7* 1.0 - 3.6 K/uL Final  . Monocytes Relative 08/24/2015 6   Final  . Monocytes Absolute 08/24/2015 0.1* 0.2 - 0.9 K/uL Final  . Eosinophils Relative 08/24/2015 1   Final  . Eosinophils Absolute 08/24/2015 0.0  0 - 0.7 K/uL Final  . Basophils Relative 08/24/2015 2   Final  . Basophils Absolute 08/24/2015 0.0  0 - 0.1 K/uL Final  . Sodium 08/24/2015 141  135 - 145 mmol/L Final  . Potassium 08/24/2015 4.2  3.5 - 5.1 mmol/L Final  . Chloride  08/24/2015 105  101 - 111 mmol/L Final  . CO2 08/24/2015 28  22 - 32 mmol/L Final  . Glucose, Bld 08/24/2015 93  65 - 99 mg/dL Final  . BUN 08/24/2015 13  6 - 20 mg/dL Final  . Creatinine, Ser 08/24/2015 0.78  0.44 - 1.00 mg/dL Final  . Calcium 08/24/2015 9.3  8.9 - 10.3 mg/dL Final  . Total Protein 08/24/2015 7.4  6.5 - 8.1 g/dL Final  . Albumin 08/24/2015 4.5  3.5 - 5.0 g/dL Final  . AST 08/24/2015 23  15 - 41 U/L Final  . ALT 08/24/2015 13* 14 - 54 U/L Final  . Alkaline Phosphatase 08/24/2015 57  38 - 126 U/L Final  . Total Bilirubin 08/24/2015 0.7  0.3 - 1.2 mg/dL Final  . GFR calc non Af Amer 08/24/2015 >60  >60 mL/min Final  . GFR calc Af Amer 08/24/2015 >60  >60 mL/min Final   Comment: (NOTE) The eGFR has been calculated using the CKD EPI equation. This calculation has not been validated in all clinical situations. eGFR's persistently <60 mL/min signify possible Chronic Kidney Disease.   . Anion gap 08/24/2015 8  5 - 15 Final  . 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 Neutropenia but no fever. Tumor markers are slowly declining.   MEDICAL DECISION MAKING:  Lab data has been reviewed.\Tumor markers are slowly declining  Continue IBRANCE and letrozole Myelosuppression secondary to [facility] but no evidence of any infection or fever. Patient  is  myelosuppressed secondary to chemotherapy.  [facility]) Was advised to call me if spikes fever more than 100.  Or any chills or fever.  In January XGEVA and IBRANCE and letrozole  Patient expressed understanding and was in agreement with this plan. She also understands that She can call clinic at any time with any questions, concerns, or complaints.    Cancer  of breast   Staging form: Breast, AJCC 7th Edition     Clinical: Stage IV (T1b, N0, M1) - Signed by Forest Gleason, MD on 05/09/2015   Forest Gleason, MD   08/25/2015 8:07 AM

## 2015-09-21 ENCOUNTER — Inpatient Hospital Stay: Payer: BLUE CROSS/BLUE SHIELD | Attending: Oncology

## 2015-09-21 ENCOUNTER — Inpatient Hospital Stay (HOSPITAL_BASED_OUTPATIENT_CLINIC_OR_DEPARTMENT_OTHER): Payer: BLUE CROSS/BLUE SHIELD | Admitting: Oncology

## 2015-09-21 ENCOUNTER — Encounter: Payer: Self-pay | Admitting: Oncology

## 2015-09-21 ENCOUNTER — Inpatient Hospital Stay: Payer: BLUE CROSS/BLUE SHIELD

## 2015-09-21 VITALS — BP 148/78 | HR 64 | Temp 97.2°F | Resp 18 | Ht 61.6 in | Wt 150.1 lb

## 2015-09-21 DIAGNOSIS — Z79899 Other long term (current) drug therapy: Secondary | ICD-10-CM

## 2015-09-21 DIAGNOSIS — Z923 Personal history of irradiation: Secondary | ICD-10-CM

## 2015-09-21 DIAGNOSIS — Z79811 Long term (current) use of aromatase inhibitors: Secondary | ICD-10-CM | POA: Diagnosis not present

## 2015-09-21 DIAGNOSIS — C50811 Malignant neoplasm of overlapping sites of right female breast: Secondary | ICD-10-CM | POA: Insufficient documentation

## 2015-09-21 DIAGNOSIS — C7951 Secondary malignant neoplasm of bone: Secondary | ICD-10-CM | POA: Insufficient documentation

## 2015-09-21 DIAGNOSIS — Z170 Estrogen receptor positive status [ER+]: Secondary | ICD-10-CM | POA: Diagnosis not present

## 2015-09-21 DIAGNOSIS — C50911 Malignant neoplasm of unspecified site of right female breast: Secondary | ICD-10-CM

## 2015-09-21 DIAGNOSIS — R590 Localized enlarged lymph nodes: Secondary | ICD-10-CM

## 2015-09-21 LAB — CBC WITH DIFFERENTIAL/PLATELET
BASOS ABS: 0 10*3/uL (ref 0–0.1)
BASOS PCT: 2 %
Eosinophils Absolute: 0 10*3/uL (ref 0–0.7)
Eosinophils Relative: 2 %
HEMATOCRIT: 33.7 % — AB (ref 35.0–47.0)
HEMOGLOBIN: 11.5 g/dL — AB (ref 12.0–16.0)
Lymphocytes Relative: 40 %
Lymphs Abs: 0.7 10*3/uL — ABNORMAL LOW (ref 1.0–3.6)
MCH: 35.7 pg — ABNORMAL HIGH (ref 26.0–34.0)
MCHC: 34.2 g/dL (ref 32.0–36.0)
MCV: 104.5 fL — ABNORMAL HIGH (ref 80.0–100.0)
Monocytes Absolute: 0.1 10*3/uL — ABNORMAL LOW (ref 0.2–0.9)
Monocytes Relative: 8 %
NEUTROS ABS: 0.8 10*3/uL — AB (ref 1.4–6.5)
NEUTROS PCT: 48 %
Platelets: 151 10*3/uL (ref 150–440)
RBC: 3.23 MIL/uL — ABNORMAL LOW (ref 3.80–5.20)
RDW: 15.4 % — ABNORMAL HIGH (ref 11.5–14.5)
WBC: 1.8 10*3/uL — ABNORMAL LOW (ref 3.6–11.0)

## 2015-09-21 LAB — COMPREHENSIVE METABOLIC PANEL
ALBUMIN: 4.3 g/dL (ref 3.5–5.0)
ALK PHOS: 54 U/L (ref 38–126)
ALT: 15 U/L (ref 14–54)
AST: 17 U/L (ref 15–41)
Anion gap: 5 (ref 5–15)
BILIRUBIN TOTAL: 0.5 mg/dL (ref 0.3–1.2)
BUN: 15 mg/dL (ref 6–20)
CALCIUM: 8.8 mg/dL — AB (ref 8.9–10.3)
CO2: 28 mmol/L (ref 22–32)
Chloride: 103 mmol/L (ref 101–111)
Creatinine, Ser: 0.76 mg/dL (ref 0.44–1.00)
GFR calc Af Amer: >60 mL/min (ref >60)
GFR calc non Af Amer: >60 mL/min (ref >60)
GLUCOSE: 98 mg/dL (ref 65–99)
Potassium: 4.2 mmol/L (ref 3.5–5.1)
Sodium: 136 mmol/L (ref 135–145)
TOTAL PROTEIN: 7.1 g/dL (ref 6.5–8.1)

## 2015-09-21 MED ORDER — METHYLPREDNISOLONE SODIUM SUCC 125 MG IJ SOLR: 125.0000 mg | Freq: Once | INTRAMUSCULAR | Status: DC | PRN

## 2015-09-21 MED ORDER — SODIUM CHLORIDE 0.9 % IV SOLN
Freq: Once | INTRAVENOUS | Status: DC | PRN
  Filled 2015-09-21: qty 1000

## 2015-09-21 MED ORDER — DIPHENHYDRAMINE HCL 50 MG/ML IJ SOLN: 25.0000 mg | Freq: Once | INTRAMUSCULAR | Status: DC | PRN

## 2015-09-21 MED ORDER — ALBUTEROL SULFATE (2.5 MG/3ML) 0.083% IN NEBU: 2.5000 mg | INHALATION_SOLUTION | Freq: Once | RESPIRATORY_TRACT | Status: DC | PRN

## 2015-09-21 MED ORDER — DENOSUMAB 120 MG/1.7ML ~~LOC~~ SOLN
120.0000 mg | Freq: Once | SUBCUTANEOUS | Status: AC
  Administered 2015-09-21: 120 mg via SUBCUTANEOUS
  Filled 2015-09-21: qty 1.7

## 2015-09-21 NOTE — Progress Notes (Signed)
Pt doing well no c/o here for xgeva. Tolerating ibrance

## 2015-09-21 NOTE — Progress Notes (Signed)
[facility] @ [facility] [phone]  [phone]     Elizabeth Mccoy OB: 10/18/1956  MR#: [id number]  CSN#:[id number]  Patient Care Team: Dawn Erikson, FNP as PCP - General (Nurse Practitioner)  CHIEF COMPLAINT:  Chief Complaint  Patient presents with  . Breast Cancer   Oncology History   1carcinoma of breast biopsy done from the mass at 9:00 position (right breast) at [facility] on October 20, 2014.   T1 cN0 M1 stage IV disease [id number] biopsies positive for invasive mammary carcinoma with both Dr. lobular features.  Estrogen receptor +94%.  Progesterone receptor +95%.  HER-2/neu receptor -0. 2.  X-rays of the lumbar spine done in now September of 2015 revealed diffuse sclerotic bone lesion. 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  Liver is normal.  There are multiple sub-centimeter porta hepatis and pelvic and inguinal lymphadenopathy 4.  Recent has received 2 weeks of radiation therapy to the right hip at [facility] 5.  Has been started on letrozole 3 weeks ago (November 22, 2014) 6.recent has been switched to IBRANCE and letrozole 2 weeks ago starting from January 13 (IBRANCE 125 mg by mouth for 3 weeks with one week off ) dose of IBRANCE has been reduced 100 mg because of neutropenia      Oncology Flowsheet 05/04/2015 06/01/2015 06/29/2015 07/27/2015 08/24/2015  denosumab (XGEVA) [place] 120 mg 120 mg 120 mg 120 mg 120 mg    INTERVAL HISTORY:  58-year-old lady came today for further follow-up regarding stage IV carcinoma breast.  Patient is taking IBRANCE, letrozole Also getting XGEVA.  No bony pain appetite has been fairly good.  Ambulation is improved.  No nausea.  No vomiting.  No diarrhea.. Patient is here for ongoing evaluation and continuation of therapy.  No bony pain appetite has been stable.  No chills or fever REVIEW OF  SYSTEMS:   GENERAL:  Feels good.  Active.  No fevers, sweats or weight loss. PERFORMANCE STATUS (ECOG):0 HEENT:  No visual changes, runny nose, sore throat, mouth sores or tenderness. Lungs: No shortness of breath or cough.  No hemoptysis. Cardiac:  No chest pain, palpitations, orthopnea, or PND. GI:  No nausea, vomiting, diarrhea, constipation, melena or hematochezia. GU:  No urgency, frequency, dysuria, or hematuria. Musculoskeletal:  No back pain.  No joint pain.  No muscle tenderness. Extremities:  No pain or swelling. Skin:  No rashes or skin changes. Neuro:  No headache, numbness or weakness, balance or coordination issues. Endocrine:  No diabetes, thyroid issues, hot flashes or night sweats. Psych:  No mood changes, depression or anxiety. Pain:  No focal pain. Review of systems:  All other systems reviewed and found to be negative.  As per HPI. Otherwise, a complete review of systems is negatve.  PAST MEDICAL HISTORY: Past Medical History  Diagnosis Date  . Cancer of breast (HCC) 05/04/2015  . Breast cancer (HCC)     PAST SURGICAL HISTORY: History reviewed. No pertinent past surgical history.  FAMILY HISTORY History reviewed. No pertinent family history. Preventive Screening:  Has patient had any of the following test? Colonscopy  Mammography  Pap Smear (1)   Last Colonoscopy: Never(1)   Last Mammography: 2015(1)   Last Pap Smear: Nov 2015(1)   Smoking History: Smoking History Never Smoked.(1)  PFSH: Family History: noncontributory  Social History: negative alcohol, negative tobacco  Additional Past Medical and Surgical History:   no other major medical illness  ECOLOGIC HISTORY:  No LMP recorded (approximate). Patient is postmenopausal.     ADVANCED DIRECTIVES: Patient does not have any advanced healthcare directive. Information has been given.   HEALTH MAINTENANCE: Social History  Substance Use Topics  . Smoking status: Never Smoker   . Smokeless  tobacco: Never Used  . Alcohol Use: No      Allergies  Allergen Reactions  . Beef Extract Anaphylaxis  . Pork (Porcine) Protein Anaphylaxis  . Beef-Derived Products Hives    Current Outpatient Prescriptions  Medication Sig Dispense Refill  . Ascorbic Acid (VITAMIN C) 100 MG tablet Take 100 mg by mouth daily.    . calcium-vitamin D (OSCAL WITH D) 500-200 MG-UNIT per tablet Take 1 tablet by mouth 3 (three) times daily.    Marland Kitchen letrozole (FEMARA) 2.5 MG tablet Take 2.5 mg by mouth.    . Multiple Vitamins-Minerals (MULTIVITAMIN WITH MINERALS) tablet Take 1 tablet by mouth daily.    . palbociclib (IBRANCE) 100 MG capsule Take 125 mg by mouth daily.     No current facility-administered medications for this visit.    OBJECTIVE:  Filed Vitals:   09/21/15 1121  BP: 148/78  Pulse: 64  Temp: 97.2 F (36.2 C)  Resp: 18     Body mass index is 27.8 kg/(m^2).    ECOG FS:0 - Asymptomatic  PHYSICAL EXAM: GENERAL:  Well developed, well nourished, sitting comfortably in the exam room in no acute distress. MENTAL STATUS:  Alert and oriented to person, place and time. HEAD:   Normocephalic, atraumatic, face symmetric, no Cushingoid features. EYES:    Pupils equal round and reactive to light and accomodation.  No conjunctivitis or scleral icterus. ENT:  Oropharynx clear without lesion.  Tongue normal. Mucous membranes moist.  RESPIRATORY:  Clear to auscultation without rales, wheezes or rhonchi. CARDIOVASCULAR:  Regular rate and rhythm without murmur, rub or gallop. BREAST:  Right breast : Palpable mass is decreased in size. NO skin changes or nipple discharge.  Left breast without masses, skin changes or nipple discharge. ABDOMEN:  Soft, non-tender, with active bowel sounds, and no hepatosplenomegaly.  No masses. BACK:  No CVA tenderness.  No tenderness on percussion of the back or rib cage. SKIN:  No rashes, ulcers or lesions. EXTREMITIES: No edema, no skin discoloration or tenderness.  No  palpable cords. LYMPH NODES: No palpable cervical, supraclavicular, axillary or inguinal adenopathy  NEUROLOGICAL: Unremarkable. PSYCH:  Appropriate.   LAB RESULTS:  Appointment on 09/21/2015  Component Date Value Ref Range Status  . WBC 09/21/2015 1.8* 3.6 - 11.0 K/uL Final  . RBC 09/21/2015 3.23* 3.80 - 5.20 MIL/uL Final  . Hemoglobin 09/21/2015 11.5* 12.0 - 16.0 g/dL Final  . HCT 09/21/2015 33.7* 35.0 - 47.0 % Final  . MCV 09/21/2015 104.5* 80.0 - 100.0 fL Final  . MCH 09/21/2015 35.7* 26.0 - 34.0 pg Final  . MCHC 09/21/2015 34.2  32.0 - 36.0 g/dL Final  . RDW 09/21/2015 15.4* 11.5 - 14.5 % Final  . Platelets 09/21/2015 151  150 - 440 K/uL Final  . Neutrophils Relative % 09/21/2015 48   Final  . Neutro Abs 09/21/2015 0.8* 1.4 - 6.5 K/uL Final  . Lymphocytes Relative 09/21/2015 40   Final  . Lymphs Abs 09/21/2015 0.7* 1.0 - 3.6 K/uL Final  . Monocytes Relative 09/21/2015 8   Final  . Monocytes Absolute 09/21/2015 0.1* 0.2 - 0.9 K/uL Final  . Eosinophils Relative 09/21/2015 2   Final  .  Eosinophils Absolute 09/21/2015 0.0  0 - 0.7 K/uL Final  . Basophils Relative 09/21/2015 2   Final  . Basophils Absolute 09/21/2015 0.0  0 - 0.1 K/uL Final  . Sodium 09/21/2015 136  135 - 145 mmol/L Final  . Potassium 09/21/2015 4.2  3.5 - 5.1 mmol/L Final  . Chloride 09/21/2015 103  101 - 111 mmol/L Final  . CO2 09/21/2015 28  22 - 32 mmol/L Final  . Glucose, Bld 09/21/2015 98  65 - 99 mg/dL Final  . BUN 09/21/2015 15  6 - 20 mg/dL Final  . Creatinine, Ser 09/21/2015 0.76  0.44 - 1.00 mg/dL Final  . Calcium 09/21/2015 8.8* 8.9 - 10.3 mg/dL Final  . Total Protein 09/21/2015 7.1  6.5 - 8.1 g/dL Final  . Albumin 09/21/2015 4.3  3.5 - 5.0 g/dL Final  . AST 09/21/2015 17  15 - 41 U/L Final  . ALT 09/21/2015 15  14 - 54 U/L Final  . Alkaline Phosphatase 09/21/2015 54  38 - 126 U/L Final  . Total Bilirubin 09/21/2015 0.5  0.3 - 1.2 mg/dL Final  . GFR calc non Af Amer 09/21/2015 >60  >60 mL/min  Final  . GFR calc Af Amer 09/21/2015 >60  >60 mL/min Final   Comment: (NOTE) The eGFR has been calculated using the CKD EPI equation. This calculation has not been validated in all clinical situations. eGFR's persistently <60 mL/min signify possible Chronic Kidney Disease.   . 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 Patient  is myelosuppressed secondary to chemotherapy.  (IBRANCE) Was advised to call me if spikes fever more than 100.  Or any chills or fever.  In January XGEVA and IBRANCE and letrozole  Patient expressed understanding and was in agreement with this plan. She also understands that She can call clinic at any time with any questions, concerns, or complaints.    Cancer of breast   Staging form: Breast, AJCC 7th Edition     Clinical: Stage IV (T1b, N0, M1) - Signed by  , MD on 05/09/2015    , MD   09/21/2015 12:02 PM 

## 2015-09-22 LAB — CANCER ANTIGEN 27.29: CA 27.29: 91.7 U/mL — AB (ref 0.0–38.6)

## 2015-09-25 ENCOUNTER — Encounter: Payer: Self-pay | Admitting: Oncology

## 2015-09-29 ENCOUNTER — Telehealth: Payer: Self-pay | Admitting: *Deleted

## 2015-09-29 ENCOUNTER — Other Ambulatory Visit: Payer: Self-pay | Admitting: *Deleted

## 2015-09-29 ENCOUNTER — Other Ambulatory Visit: Payer: Self-pay | Admitting: Oncology

## 2015-09-29 DIAGNOSIS — C50911 Malignant neoplasm of unspecified site of right female breast: Secondary | ICD-10-CM

## 2015-09-29 MED ORDER — AMOXICILLIN-POT CLAVULANATE 875-125 MG PO TABS: 1.0000 | ORAL_TABLET | Freq: Two times a day (BID) | ORAL | Status: DC

## 2015-09-29 NOTE — Telephone Encounter (Signed)
Per Sheena, pt have sinus pressure and pain with no fever. Rx for augmentin escribed to pharmacy per MD instructions.

## 2015-10-03 ENCOUNTER — Encounter: Payer: Self-pay | Admitting: *Deleted

## 2015-10-03 NOTE — Progress Notes (Signed)
Oncology Nurse Navigator Documentation  Oncology Nurse Navigator Flowsheets 10/03/2015  Navigator Encounter Type Telephone  Patient Visit Type Follow-up  Interventions Coordination of Care  Coordination of Care MD Appointments;Other;Mammogram  Time Spent with Patient 15   Patient is to see her pcp and wants to get all her labs drawn here on 11/9.  She also wanted to know the date of her mammogram.   Mammogram scheduled at [facility] for 10/21/15.  Patient is to call me back with additional labs needed.

## 2015-10-04 ENCOUNTER — Other Ambulatory Visit: Payer: Self-pay | Admitting: *Deleted

## 2015-10-04 DIAGNOSIS — C50919 Malignant neoplasm of unspecified site of unspecified female breast: Secondary | ICD-10-CM

## 2015-10-11 ENCOUNTER — Other Ambulatory Visit: Payer: Self-pay | Admitting: *Deleted

## 2015-10-11 DIAGNOSIS — C50919 Malignant neoplasm of unspecified site of unspecified female breast: Secondary | ICD-10-CM

## 2015-10-11 DIAGNOSIS — C7951 Secondary malignant neoplasm of bone: Secondary | ICD-10-CM

## 2015-10-19 ENCOUNTER — Inpatient Hospital Stay: Payer: BLUE CROSS/BLUE SHIELD

## 2015-10-19 ENCOUNTER — Inpatient Hospital Stay: Payer: BLUE CROSS/BLUE SHIELD | Attending: Oncology

## 2015-10-19 DIAGNOSIS — C50919 Malignant neoplasm of unspecified site of unspecified female breast: Secondary | ICD-10-CM

## 2015-10-19 DIAGNOSIS — C50911 Malignant neoplasm of unspecified site of right female breast: Secondary | ICD-10-CM

## 2015-10-19 DIAGNOSIS — C7951 Secondary malignant neoplasm of bone: Secondary | ICD-10-CM | POA: Diagnosis not present

## 2015-10-19 DIAGNOSIS — Z170 Estrogen receptor positive status [ER+]: Secondary | ICD-10-CM | POA: Diagnosis not present

## 2015-10-19 DIAGNOSIS — Z923 Personal history of irradiation: Secondary | ICD-10-CM | POA: Diagnosis not present

## 2015-10-19 DIAGNOSIS — C50811 Malignant neoplasm of overlapping sites of right female breast: Secondary | ICD-10-CM | POA: Insufficient documentation

## 2015-10-19 DIAGNOSIS — Z79811 Long term (current) use of aromatase inhibitors: Secondary | ICD-10-CM | POA: Insufficient documentation

## 2015-10-19 LAB — CBC WITH DIFFERENTIAL/PLATELET
Basophils Absolute: 0 10*3/uL (ref 0–0.1)
Basophils Relative: 2 %
EOS PCT: 1 %
Eosinophils Absolute: 0 10*3/uL (ref 0–0.7)
HEMATOCRIT: 33.2 % — AB (ref 35.0–47.0)
Hemoglobin: 11.3 g/dL — ABNORMAL LOW (ref 12.0–16.0)
LYMPHS ABS: 0.6 10*3/uL — AB (ref 1.0–3.6)
LYMPHS PCT: 41 %
MCH: 35.7 pg — AB (ref 26.0–34.0)
MCHC: 34.1 g/dL (ref 32.0–36.0)
MCV: 104.8 fL — AB (ref 80.0–100.0)
MONO ABS: 0.1 10*3/uL — AB (ref 0.2–0.9)
Monocytes Relative: 7 %
NEUTROS ABS: 0.7 10*3/uL — AB (ref 1.4–6.5)
Neutrophils Relative %: 49 %
PLATELETS: 156 10*3/uL (ref 150–440)
RBC: 3.17 MIL/uL — AB (ref 3.80–5.20)
RDW: 15.7 % — ABNORMAL HIGH (ref 11.5–14.5)
WBC: 1.5 10*3/uL — AB (ref 3.6–11.0)

## 2015-10-19 LAB — COMPREHENSIVE METABOLIC PANEL
ALT: 15 U/L (ref 14–54)
AST: 19 U/L (ref 15–41)
Albumin: 4.5 g/dL (ref 3.5–5.0)
Alkaline Phosphatase: 50 U/L (ref 38–126)
Anion gap: 7 (ref 5–15)
BILIRUBIN TOTAL: 0.4 mg/dL (ref 0.3–1.2)
BUN: 17 mg/dL (ref 6–20)
CALCIUM: 9.3 mg/dL (ref 8.9–10.3)
CHLORIDE: 101 mmol/L (ref 101–111)
CO2: 28 mmol/L (ref 22–32)
CREATININE: 0.77 mg/dL (ref 0.44–1.00)
Glucose, Bld: 97 mg/dL (ref 65–99)
Potassium: 3.9 mmol/L (ref 3.5–5.1)
Sodium: 136 mmol/L (ref 135–145)
TOTAL PROTEIN: 7.2 g/dL (ref 6.5–8.1)

## 2015-10-19 LAB — LIPID PANEL
CHOLESTEROL: 219 mg/dL — AB (ref 0–200)
HDL: 54 mg/dL (ref >40)
LDL CALC: 141 mg/dL — AB (ref 0–99)
TRIGLYCERIDES: 119 mg/dL (ref <150)
Total CHOL/HDL Ratio: 4.1 RATIO
VLDL: 24 mg/dL (ref 0–40)

## 2015-10-19 MED ORDER — DENOSUMAB 120 MG/1.7ML ~~LOC~~ SOLN
120.0000 mg | Freq: Once | SUBCUTANEOUS | Status: AC
  Administered 2015-10-19: 120 mg via SUBCUTANEOUS
  Filled 2015-10-19: qty 1.7

## 2015-10-19 MED ORDER — SODIUM CHLORIDE 0.9 % IV SOLN
Freq: Once | INTRAVENOUS | Status: DC | PRN
  Filled 2015-10-19: qty 1000

## 2015-10-19 MED ORDER — METHYLPREDNISOLONE SODIUM SUCC 125 MG IJ SOLR: 125.0000 mg | Freq: Once | INTRAMUSCULAR | Status: DC | PRN

## 2015-10-19 MED ORDER — ALBUTEROL SULFATE (2.5 MG/3ML) 0.083% IN NEBU: 2.5000 mg | INHALATION_SOLUTION | Freq: Once | RESPIRATORY_TRACT | Status: DC | PRN

## 2015-10-19 MED ORDER — DIPHENHYDRAMINE HCL 50 MG/ML IJ SOLN: 25.0000 mg | Freq: Once | INTRAMUSCULAR | Status: DC | PRN

## 2015-10-19 NOTE — Progress Notes (Signed)
xgeva given,pt tolerated well, denies pain or discomfort

## 2015-10-20 LAB — CANCER ANTIGEN 27.29: CA 27.29: 85.2 U/mL — AB (ref 0.0–38.6)

## 2015-11-11 ENCOUNTER — Other Ambulatory Visit: Payer: Self-pay | Admitting: Family Medicine

## 2015-11-14 ENCOUNTER — Ambulatory Visit
Admission: RE | Admit: 2015-11-14 | Discharge: 2015-11-14 | Disposition: A | Discharge status: Home / Self Care | Payer: BLUE CROSS/BLUE SHIELD | Source: Ambulatory Visit | Attending: Oncology | Admitting: Oncology

## 2015-11-14 DIAGNOSIS — I700 Atherosclerosis of aorta: Secondary | ICD-10-CM | POA: Diagnosis not present

## 2015-11-14 DIAGNOSIS — Z853 Personal history of malignant neoplasm of breast: Secondary | ICD-10-CM | POA: Insufficient documentation

## 2015-11-14 DIAGNOSIS — C7951 Secondary malignant neoplasm of bone: Secondary | ICD-10-CM | POA: Diagnosis present

## 2015-11-14 DIAGNOSIS — C50919 Malignant neoplasm of unspecified site of unspecified female breast: Secondary | ICD-10-CM

## 2015-11-14 DIAGNOSIS — Z08 Encounter for follow-up examination after completed treatment for malignant neoplasm: Secondary | ICD-10-CM | POA: Insufficient documentation

## 2015-11-14 DIAGNOSIS — N281 Cyst of kidney, acquired: Secondary | ICD-10-CM | POA: Insufficient documentation

## 2015-11-14 DIAGNOSIS — C50911 Malignant neoplasm of unspecified site of right female breast: Secondary | ICD-10-CM

## 2015-11-14 LAB — GLUCOSE, CAPILLARY: GLUCOSE-CAPILLARY: 65 mg/dL (ref 65–99)

## 2015-11-14 MED ORDER — FLUDEOXYGLUCOSE F - 18 (FDG) INJECTION
12.9500 | Freq: Once | INTRAVENOUS | Status: AC | PRN
  Administered 2015-11-14: 12.95 via INTRAVENOUS

## 2015-11-16 ENCOUNTER — Inpatient Hospital Stay: Payer: BLUE CROSS/BLUE SHIELD | Attending: Oncology

## 2015-11-16 ENCOUNTER — Other Ambulatory Visit: Payer: Self-pay | Admitting: Oncology

## 2015-11-16 ENCOUNTER — Inpatient Hospital Stay: Payer: BLUE CROSS/BLUE SHIELD

## 2015-11-16 ENCOUNTER — Inpatient Hospital Stay (HOSPITAL_BASED_OUTPATIENT_CLINIC_OR_DEPARTMENT_OTHER): Payer: BLUE CROSS/BLUE SHIELD | Admitting: Oncology

## 2015-11-16 VITALS — BP 158/87 | HR 71 | Temp 97.6°F | Resp 18 | Wt 151.2 lb

## 2015-11-16 DIAGNOSIS — Z79811 Long term (current) use of aromatase inhibitors: Secondary | ICD-10-CM

## 2015-11-16 DIAGNOSIS — C7951 Secondary malignant neoplasm of bone: Secondary | ICD-10-CM | POA: Diagnosis not present

## 2015-11-16 DIAGNOSIS — R590 Localized enlarged lymph nodes: Secondary | ICD-10-CM

## 2015-11-16 DIAGNOSIS — Z923 Personal history of irradiation: Secondary | ICD-10-CM

## 2015-11-16 DIAGNOSIS — C50811 Malignant neoplasm of overlapping sites of right female breast: Secondary | ICD-10-CM

## 2015-11-16 DIAGNOSIS — C50919 Malignant neoplasm of unspecified site of unspecified female breast: Secondary | ICD-10-CM

## 2015-11-16 DIAGNOSIS — Z170 Estrogen receptor positive status [ER+]: Secondary | ICD-10-CM | POA: Insufficient documentation

## 2015-11-16 DIAGNOSIS — Z9221 Personal history of antineoplastic chemotherapy: Secondary | ICD-10-CM

## 2015-11-16 DIAGNOSIS — Z79899 Other long term (current) drug therapy: Secondary | ICD-10-CM | POA: Diagnosis not present

## 2015-11-16 DIAGNOSIS — T451X5S Adverse effect of antineoplastic and immunosuppressive drugs, sequela: Secondary | ICD-10-CM | POA: Insufficient documentation

## 2015-11-16 DIAGNOSIS — C50911 Malignant neoplasm of unspecified site of right female breast: Secondary | ICD-10-CM

## 2015-11-16 DIAGNOSIS — D759 Disease of blood and blood-forming organs, unspecified: Secondary | ICD-10-CM

## 2015-11-16 DIAGNOSIS — T386X5S Adverse effect of antigonadotrophins, antiestrogens, antiandrogens, not elsewhere classified, sequela: Secondary | ICD-10-CM | POA: Insufficient documentation

## 2015-11-16 LAB — CBC WITH DIFFERENTIAL/PLATELET
BASOS ABS: 0 10*3/uL (ref 0–0.1)
BASOS PCT: 1 %
EOS ABS: 0 10*3/uL (ref 0–0.7)
EOS PCT: 1 %
HCT: 33.4 % — ABNORMAL LOW (ref 35.0–47.0)
Hemoglobin: 11.5 g/dL — ABNORMAL LOW (ref 12.0–16.0)
Lymphocytes Relative: 38 %
Lymphs Abs: 0.6 10*3/uL — ABNORMAL LOW (ref 1.0–3.6)
MCH: 36 pg — ABNORMAL HIGH (ref 26.0–34.0)
MCHC: 34.3 g/dL (ref 32.0–36.0)
MCV: 104.9 fL — ABNORMAL HIGH (ref 80.0–100.0)
Monocytes Absolute: 0.1 10*3/uL — ABNORMAL LOW (ref 0.2–0.9)
Monocytes Relative: 6 %
Neutro Abs: 0.9 10*3/uL — ABNORMAL LOW (ref 1.4–6.5)
Neutrophils Relative %: 54 %
PLATELETS: 139 10*3/uL — AB (ref 150–440)
RBC: 3.18 MIL/uL — AB (ref 3.80–5.20)
RDW: 15.6 % — ABNORMAL HIGH (ref 11.5–14.5)
WBC: 1.6 10*3/uL — AB (ref 3.6–11.0)

## 2015-11-16 LAB — COMPREHENSIVE METABOLIC PANEL
ALT: 18 U/L (ref 14–54)
AST: 20 U/L (ref 15–41)
Albumin: 4.6 g/dL (ref 3.5–5.0)
Alkaline Phosphatase: 56 U/L (ref 38–126)
Anion gap: 6 (ref 5–15)
BUN: 12 mg/dL (ref 6–20)
CHLORIDE: 102 mmol/L (ref 101–111)
CO2: 26 mmol/L (ref 22–32)
CREATININE: 0.76 mg/dL (ref 0.44–1.00)
Calcium: 8.7 mg/dL — ABNORMAL LOW (ref 8.9–10.3)
GFR calc non Af Amer: >60 mL/min (ref >60)
Glucose, Bld: 100 mg/dL — ABNORMAL HIGH (ref 65–99)
Potassium: 3.9 mmol/L (ref 3.5–5.1)
SODIUM: 134 mmol/L — AB (ref 135–145)
Total Bilirubin: 0.5 mg/dL (ref 0.3–1.2)
Total Protein: 7.3 g/dL (ref 6.5–8.1)

## 2015-11-16 MED ORDER — DENOSUMAB 120 MG/1.7ML ~~LOC~~ SOLN
120.0000 mg | Freq: Once | SUBCUTANEOUS | Status: AC
  Administered 2015-11-16: 120 mg via SUBCUTANEOUS
  Filled 2015-11-16: qty 1.7

## 2015-11-16 NOTE — Progress Notes (Signed)
Patient here today for PET results. 

## 2015-11-17 LAB — CANCER ANTIGEN 27.29: CA 27.29: 91.7 U/mL — AB (ref 0.0–38.6)

## 2015-11-19 ENCOUNTER — Encounter: Payer: Self-pay | Admitting: Oncology

## 2015-11-19 NOTE — Progress Notes (Signed)
[place] @ [facility] [phone]  Fax:(336) [place] OB: 10/30/1956  MR#: [id number]  UYQ#:[id number]  Patient Care Team: Gerilyn Pilgrim, FNP as PCP - General (Nurse Practitioner)  CHIEF COMPLAINT:  Chief Complaint  Patient presents with  . Breast Cancer   Oncology History   1carcinoma of breast biopsy done from the mass at 9:00 position (right breast) at [facility] on October 20, 2014.   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  Estrogen receptor +94%.  Progesterone receptor +95%.  HER-2/neu receptor -0. 2.  X-rays of the lumbar spine done in now September of 2015 revealed diffuse sclerotic bone lesion. 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  Liver is normal.  There are multiple sub-centimeter porta hepatis and pelvic and inguinal lymphadenopathy 4.  Recent has received 2 weeks of radiation therapy to the right hip at [facility] 5.  Has been started on letrozole 3 weeks ago (November 22, 2014) 6.recent has been switched to [facility] and letrozole 2 weeks ago starting from January 13 (IBRANCE 125 mg by mouth for 3 weeks with one week off ) dose of IBRANCE has been reduced 100 mg because of neutropenia      Oncology Flowsheet 06/01/2015 06/29/2015 07/27/2015 08/24/2015 09/21/2015 10/19/2015 11/16/2015  denosumab (XGEVA) [place] 120 mg 120 mg 120 mg 120 mg 120 mg 120 mg 120 mg    INTERVAL HISTORY:  59 year old lady came today for further follow-up regarding stage IV carcinoma breast.  Patient is taking IBRANCE, letrozole Also getting XGEVA.  No bony pain appetite has been fairly good.  Ambulation is improved.  No nausea.  No vomiting.  No diarrhea.. Patient is here for ongoing evaluation and continuation of therapy.  No bony pain appetite has been stable.  No  chills or fever \Patient is taking letrozole and IBRANCE. Bony pains have improved.  Had a complete evaluation with PET scan and plain x-ray of the femur. Patient continues to a myelosuppression Tumor marker when he is in a small range Also had a mammogram done  REVIEW OF SYSTEMS:   GENERAL:  Feels good.  Active.  No fevers, sweats or weight loss. PERFORMANCE STATUS (ECOG):0 HEENT:  No visual changes, runny nose, sore throat, mouth sores or tenderness. Lungs: No shortness of breath or cough.  No hemoptysis. Cardiac:  No chest pain, palpitations, orthopnea, or PND. GI:  No nausea, vomiting, diarrhea, constipation, melena or hematochezia. GU:  No urgency, frequency, dysuria, or hematuria. Musculoskeletal:  No back pain.  No joint pain.  No muscle tenderness. Extremities:  No pain or swelling. Skin:  No rashes or skin changes. Neuro:  No headache, numbness or weakness, balance or coordination issues. Endocrine:  No diabetes, thyroid issues, hot flashes or night sweats. Psych:  No mood changes, depression or anxiety. Pain:  No focal pain. Review of systems:  All other systems reviewed and found to be negative.  As per HPI. Otherwise, a complete review of systems is negatve.  PAST MEDICAL HISTORY: Past Medical History  Diagnosis Date  . Cancer of breast ([place]) 05/04/2015  . Breast cancer ([place])     PAST SURGICAL HISTORY: No past surgical history on file.  FAMILY HISTORY No family history on file. Preventive Screening:  Has patient had any of the following test? Colonscopy  Mammography  Pap Smear (1)   Last Colonoscopy: Never(1)   Last Mammography: 2015(1)   Last Pap Smear: Nov 2015(1)   Smoking History: Smoking History Never Smoked.(1)  PFSH: Family History: noncontributory  Social History: negative alcohol, negative tobacco  Additional Past Medical and Surgical History: no other major medical illness  ECOLOGIC HISTORY:  No LMP recorded (approximate). Patient is  postmenopausal.     ADVANCED DIRECTIVES: Patient does not have any advanced healthcare directive. Information has been given.   HEALTH MAINTENANCE: Social History  Substance Use Topics  . Smoking status: Never Smoker   . Smokeless tobacco: Never Used  . Alcohol Use: No      Allergies  Allergen Reactions  . Beef Extract Anaphylaxis  . Pork (Porcine) Protein Anaphylaxis  . Beef-Derived Products Hives    Current Outpatient Prescriptions  Medication Sig Dispense Refill  . Ascorbic Acid (VITAMIN C) 100 MG tablet Take 100 mg by mouth daily.    . calcium-vitamin D (OSCAL WITH D) 500-200 MG-UNIT per tablet Take 1 tablet by mouth 3 (three) times daily.    . Multiple Vitamins-Minerals (MULTIVITAMIN WITH MINERALS) tablet Take 1 tablet by mouth daily.    . palbociclib (IBRANCE) 100 MG capsule Take 125 mg by mouth daily.    Marland Kitchen letrozole (FEMARA) 2.5 MG tablet TAKE ONE TABLET BY MOUTH EVERY DAY. 30 tablet 5   No current facility-administered medications for this visit.    OBJECTIVE:  Filed Vitals:   11/16/15 1045  BP: 158/87  Pulse: 71  Temp: 97.6 F (36.4 C)  Resp: 18     Body mass index is 28.01 kg/(m^2).    ECOG FS:0 - Asymptomatic  PHYSICAL EXAM: GENERAL:  Well developed, well nourished, sitting comfortably in the exam room in no acute distress. MENTAL STATUS:  Alert and oriented to person, place and time. HEAD:   Normocephalic, atraumatic, face symmetric, no Cushingoid features. EYES:    Pupils equal round and reactive to light and accomodation.  No conjunctivitis or scleral icterus. ENT:  Oropharynx clear without lesion.  Tongue normal. Mucous membranes moist.  RESPIRATORY:  Clear to auscultation without rales, wheezes or rhonchi. CARDIOVASCULAR:  Regular rate and rhythm without murmur, rub or gallop. BREAST:  Right breast : Palpable mass is decreased in size. NO skin changes or nipple discharge.  Left breast without masses, skin changes or nipple discharge. ABDOMEN:   Soft, non-tender, with active bowel sounds, and no hepatosplenomegaly.  No masses. BACK:  No CVA tenderness.  No tenderness on percussion of the back or rib cage. SKIN:  No rashes, ulcers or lesions. EXTREMITIES: No edema, no skin discoloration or tenderness.  No palpable cords. LYMPH NODES: No palpable cervical, supraclavicular, axillary or inguinal adenopathy  NEUROLOGICAL: Unremarkable. PSYCH:  Appropriate.   LAB RESULTS:  Appointment on 11/16/2015  Component Date Value Ref Range Status  . WBC 11/16/2015 1.6* 3.6 - 11.0 K/uL Final  . RBC 11/16/2015 3.18* 3.80 - 5.20 MIL/uL Final  . Hemoglobin 11/16/2015 11.5* 12.0 - 16.0 g/dL Final  . HCT 11/16/2015 33.4* 35.0 - 47.0 % Final  . MCV 11/16/2015 104.9* 80.0 - 100.0 fL Final  . MCH 11/16/2015 36.0* 26.0 - 34.0 pg Final  . MCHC 11/16/2015 34.3  32.0 - 36.0 g/dL Final  . RDW 11/16/2015 15.6* 11.5 - 14.5 % Final  . Platelets 11/16/2015 139* 150 - 440 K/uL Final  . Neutrophils Relative % 11/16/2015 54   Final  . Neutro Abs 11/16/2015 0.9* 1.4 - 6.5 K/uL Final  .  Lymphocytes Relative 11/16/2015 38   Final  . Lymphs Abs 11/16/2015 0.6* 1.0 - 3.6 K/uL Final  . Monocytes Relative 11/16/2015 6   Final  . Monocytes Absolute 11/16/2015 0.1* 0.2 - 0.9 K/uL Final  . Eosinophils Relative 11/16/2015 1   Final  . Eosinophils Absolute 11/16/2015 0.0  0 - 0.7 K/uL Final  . Basophils Relative 11/16/2015 1   Final  . Basophils Absolute 11/16/2015 0.0  0 - 0.1 K/uL Final  . Sodium 11/16/2015 134* 135 - 145 mmol/L Final  . Potassium 11/16/2015 3.9  3.5 - 5.1 mmol/L Final  . Chloride 11/16/2015 102  101 - 111 mmol/L Final  . CO2 11/16/2015 26  22 - 32 mmol/L Final  . Glucose, Bld 11/16/2015 100* 65 - 99 mg/dL Final  . BUN 11/16/2015 12  6 - 20 mg/dL Final  . Creatinine, Ser 11/16/2015 0.76  0.44 - 1.00 mg/dL Final  . Calcium 11/16/2015 8.7* 8.9 - 10.3 mg/dL Final  . Total Protein 11/16/2015 7.3  6.5 - 8.1 g/dL Final  . Albumin 11/16/2015 4.6  3.5 -  5.0 g/dL Final  . AST 11/16/2015 20  15 - 41 U/L Final  . ALT 11/16/2015 18  14 - 54 U/L Final  . Alkaline Phosphatase 11/16/2015 56  38 - 126 U/L Final  . Total Bilirubin 11/16/2015 0.5  0.3 - 1.2 mg/dL Final  . GFR calc non Af Amer 11/16/2015 >60  >60 mL/min Final  . GFR calc Af Amer 11/16/2015 >60  >60 mL/min Final   Comment: (NOTE) The eGFR has been calculated using the CKD EPI equation. This calculation has not been validated in all clinical situations. eGFR's persistently <60 mL/min signify possible Chronic Kidney Disease.   . Anion gap 11/16/2015 6  5 - 15 Final  . CA 27.29 11/16/2015 91.7* 0.0 - 38.6 U/mL Final   Comment: (NOTE) [employer] Cent[employer]/ACS methodology Performed At: [facility] [address] [place], [place] [id number] [place] [id number]   Hospital Outpatient Visit on 11/14/2015  Component Date Value Ref Range Status  . 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  CT scan, PET scan, plain x-ray and a mammogram is been reviewed independently and reviewed with the patient.  Overall there is no progressive disease.  Tumor markers are slightly elevated and will be continued to follow.  I reassured patient that overall there is a significant control over the disease and we should continue    IBRANCE and letrozole.  Myelosuppression secondary to [facility]     was instructed to call me if spikes fever   MEDICAL DECISION MAKING:  Lab data has been reviewed.\Tumor markers are slowly declining  Continue IBRANCE and letrozole Myelosuppression secondary to [facility] but no evidence of any infection or fever. Patient  is myelosuppressed secondary to chemotherapy.  [facility]) Was advised to call me if spikes fever more than  100.  Or any chills or fever.  In January XGEVA and IBRANCE and letrozole  Patient expressed understanding and was in agreement with this plan. She also understands that She can call clinic at any time with any questions, concerns, or complaints.    Cancer of breast   Staging form: Breast, AJCC 7th Edition     Clinical: Stage  IV (T1b, N0, M1) - Signed by Forest Gleason, MD on 05/09/2015   Forest Gleason, MD   11/19/2015 10:51 AM

## 2015-12-09 ENCOUNTER — Telehealth: Payer: Self-pay | Admitting: *Deleted

## 2015-12-09 DIAGNOSIS — C50911 Malignant neoplasm of unspecified site of right female breast: Secondary | ICD-10-CM

## 2015-12-09 MED ORDER — PALBOCICLIB 100 MG PO CAPS: 100.0000 mg | ORAL_CAPSULE | Freq: Every day | ORAL | Status: DC

## 2015-12-09 NOTE — Telephone Encounter (Signed)
Refill for ibrance escribed to [facility].

## 2015-12-14 ENCOUNTER — Inpatient Hospital Stay: Payer: BLUE CROSS/BLUE SHIELD | Attending: Oncology

## 2015-12-14 ENCOUNTER — Inpatient Hospital Stay: Payer: BLUE CROSS/BLUE SHIELD

## 2015-12-14 DIAGNOSIS — Z923 Personal history of irradiation: Secondary | ICD-10-CM | POA: Insufficient documentation

## 2015-12-14 DIAGNOSIS — C50811 Malignant neoplasm of overlapping sites of right female breast: Secondary | ICD-10-CM | POA: Insufficient documentation

## 2015-12-14 DIAGNOSIS — C50919 Malignant neoplasm of unspecified site of unspecified female breast: Secondary | ICD-10-CM

## 2015-12-14 DIAGNOSIS — Z170 Estrogen receptor positive status [ER+]: Secondary | ICD-10-CM | POA: Insufficient documentation

## 2015-12-14 DIAGNOSIS — Z9221 Personal history of antineoplastic chemotherapy: Secondary | ICD-10-CM | POA: Diagnosis not present

## 2015-12-14 DIAGNOSIS — C7951 Secondary malignant neoplasm of bone: Secondary | ICD-10-CM | POA: Insufficient documentation

## 2015-12-14 DIAGNOSIS — Z79811 Long term (current) use of aromatase inhibitors: Secondary | ICD-10-CM | POA: Insufficient documentation

## 2015-12-14 LAB — BASIC METABOLIC PANEL
Anion gap: 4 — ABNORMAL LOW (ref 5–15)
BUN: 15 mg/dL (ref 6–20)
CALCIUM: 8.7 mg/dL — AB (ref 8.9–10.3)
CO2: 27 mmol/L (ref 22–32)
CREATININE: 0.73 mg/dL (ref 0.44–1.00)
Chloride: 104 mmol/L (ref 101–111)
GFR calc non Af Amer: >60 mL/min (ref >60)
GLUCOSE: 101 mg/dL — AB (ref 65–99)
Potassium: 3.9 mmol/L (ref 3.5–5.1)
Sodium: 135 mmol/L (ref 135–145)

## 2015-12-14 MED ORDER — DENOSUMAB 120 MG/1.7ML ~~LOC~~ SOLN
120.0000 mg | Freq: Once | SUBCUTANEOUS | Status: AC
  Administered 2015-12-14: 120 mg via SUBCUTANEOUS
  Filled 2015-12-14: qty 1.7

## 2016-01-11 ENCOUNTER — Other Ambulatory Visit: Payer: BLUE CROSS/BLUE SHIELD

## 2016-01-11 ENCOUNTER — Inpatient Hospital Stay: Payer: BLUE CROSS/BLUE SHIELD

## 2016-01-11 ENCOUNTER — Encounter: Payer: Self-pay | Admitting: Oncology

## 2016-01-11 ENCOUNTER — Inpatient Hospital Stay (HOSPITAL_BASED_OUTPATIENT_CLINIC_OR_DEPARTMENT_OTHER): Payer: BLUE CROSS/BLUE SHIELD | Admitting: Oncology

## 2016-01-11 ENCOUNTER — Ambulatory Visit: Payer: BLUE CROSS/BLUE SHIELD | Admitting: Oncology

## 2016-01-11 ENCOUNTER — Ambulatory Visit: Payer: BLUE CROSS/BLUE SHIELD

## 2016-01-11 ENCOUNTER — Inpatient Hospital Stay: Payer: BLUE CROSS/BLUE SHIELD | Attending: Oncology

## 2016-01-11 VITALS — BP 147/88 | HR 71 | Temp 97.7°F | Resp 18 | Wt 146.6 lb

## 2016-01-11 DIAGNOSIS — Z79811 Long term (current) use of aromatase inhibitors: Secondary | ICD-10-CM | POA: Diagnosis not present

## 2016-01-11 DIAGNOSIS — Z170 Estrogen receptor positive status [ER+]: Secondary | ICD-10-CM

## 2016-01-11 DIAGNOSIS — D701 Agranulocytosis secondary to cancer chemotherapy: Secondary | ICD-10-CM | POA: Diagnosis not present

## 2016-01-11 DIAGNOSIS — Z923 Personal history of irradiation: Secondary | ICD-10-CM

## 2016-01-11 DIAGNOSIS — Z9221 Personal history of antineoplastic chemotherapy: Secondary | ICD-10-CM | POA: Insufficient documentation

## 2016-01-11 DIAGNOSIS — T451X5S Adverse effect of antineoplastic and immunosuppressive drugs, sequela: Secondary | ICD-10-CM

## 2016-01-11 DIAGNOSIS — Z79899 Other long term (current) drug therapy: Secondary | ICD-10-CM | POA: Diagnosis not present

## 2016-01-11 DIAGNOSIS — C50919 Malignant neoplasm of unspecified site of unspecified female breast: Secondary | ICD-10-CM

## 2016-01-11 DIAGNOSIS — R509 Fever, unspecified: Secondary | ICD-10-CM

## 2016-01-11 DIAGNOSIS — R197 Diarrhea, unspecified: Secondary | ICD-10-CM

## 2016-01-11 DIAGNOSIS — C50811 Malignant neoplasm of overlapping sites of right female breast: Secondary | ICD-10-CM | POA: Diagnosis present

## 2016-01-11 DIAGNOSIS — C7951 Secondary malignant neoplasm of bone: Secondary | ICD-10-CM | POA: Diagnosis not present

## 2016-01-11 LAB — COMPREHENSIVE METABOLIC PANEL
ALT: 20 U/L (ref 14–54)
AST: 16 U/L (ref 15–41)
Albumin: 4.2 g/dL (ref 3.5–5.0)
Alkaline Phosphatase: 48 U/L (ref 38–126)
Anion gap: 6 (ref 5–15)
BUN: 12 mg/dL (ref 6–20)
CHLORIDE: 101 mmol/L (ref 101–111)
CO2: 28 mmol/L (ref 22–32)
CREATININE: 0.9 mg/dL (ref 0.44–1.00)
Calcium: 9.2 mg/dL (ref 8.9–10.3)
GFR calc Af Amer: >60 mL/min (ref >60)
GFR calc non Af Amer: >60 mL/min (ref >60)
Glucose, Bld: 107 mg/dL — ABNORMAL HIGH (ref 65–99)
Potassium: 4.1 mmol/L (ref 3.5–5.1)
SODIUM: 135 mmol/L (ref 135–145)
Total Bilirubin: 0.5 mg/dL (ref 0.3–1.2)
Total Protein: 7.3 g/dL (ref 6.5–8.1)

## 2016-01-11 LAB — CBC WITH DIFFERENTIAL/PLATELET
BASOS ABS: 0.1 10*3/uL (ref 0–0.1)
Basophils Relative: 4 %
EOS ABS: 0 10*3/uL (ref 0–0.7)
EOS PCT: 2 %
HCT: 34 % — ABNORMAL LOW (ref 35.0–47.0)
Hemoglobin: 11.6 g/dL — ABNORMAL LOW (ref 12.0–16.0)
LYMPHS PCT: 33 %
Lymphs Abs: 0.6 10*3/uL — ABNORMAL LOW (ref 1.0–3.6)
MCH: 35.4 pg — ABNORMAL HIGH (ref 26.0–34.0)
MCHC: 34.1 g/dL (ref 32.0–36.0)
MCV: 103.8 fL — ABNORMAL HIGH (ref 80.0–100.0)
Monocytes Absolute: 0.1 10*3/uL — ABNORMAL LOW (ref 0.2–0.9)
Monocytes Relative: 5 %
Neutro Abs: 1 10*3/uL — ABNORMAL LOW (ref 1.4–6.5)
Neutrophils Relative %: 56 %
PLATELETS: 182 10*3/uL (ref 150–440)
RBC: 3.28 MIL/uL — AB (ref 3.80–5.20)
RDW: 14.9 % — ABNORMAL HIGH (ref 11.5–14.5)
WBC: 1.7 10*3/uL — AB (ref 3.6–11.0)

## 2016-01-11 LAB — MAGNESIUM: Magnesium: 2 mg/dL (ref 1.7–2.4)

## 2016-01-11 MED ORDER — LEVOFLOXACIN 500 MG PO TABS: 500.0000 mg | ORAL_TABLET | Freq: Every day | ORAL | Status: DC

## 2016-01-11 MED ORDER — DENOSUMAB 120 MG/1.7ML ~~LOC~~ SOLN
120.0000 mg | Freq: Once | SUBCUTANEOUS | Status: AC
  Administered 2016-01-11: 120 mg via SUBCUTANEOUS
  Filled 2016-01-11: qty 1.7

## 2016-01-11 NOTE — Progress Notes (Signed)
[place] @ [facility] Telephone:([phone]  Fax:(336) [place] OB: February 28, 1956  MR#: [id number]  VUY#:[id number]  Patient Care Team: Gerilyn Pilgrim, FNP as PCP - General (Nurse Practitioner)  CHIEF COMPLAINT:  Chief Complaint  Patient presents with  . Breast Cancer   Oncology History   1carcinoma of breast biopsy done from the mass at 9:00 position (right breast) at [facility] on October 20, 2014.   T1 cN0 M1 stage IV disease [id number] biopsies positive for invasive mammary carcinoma with both Dr. lobular features.  Estrogen receptor +94%.  Progesterone receptor +95%.  HER-2/neu receptor -0. 2.  X-rays of the lumbar spine done in now September of 2015 revealed diffuse sclerotic bone lesion. 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  Liver is normal.  There are multiple sub-centimeter porta hepatis and pelvic and inguinal lymphadenopathy 4.  Recent has received 2 weeks of radiation therapy to the right hip at [facility] 5.  Has been started on letrozole 3 weeks ago (November 22, 2014) 6.recent has been switched to [facility] and letrozole 2 weeks ago starting from January 13 (IBRANCE 125 mg by mouth for 3 weeks with one week off ) dose of IBRANCE has been reduced 100 mg because of neutropenia      Oncology Flowsheet 06/29/2015 07/27/2015 08/24/2015 09/21/2015 10/19/2015 11/16/2015 12/14/2015  denosumab (XGEVA) [place] 120 _0  mg 120 mg    INTERVAL HISTORY:  60 year old lady came today for further follow-up regarding stage IV carcinoma breast.  Patient is taking IBRANCE, letrozole Also getting XGEVA.  No bony pain appetite has been fairly good.  Ambulation is improved.  No nausea.  No vomiting.  No diarrhea.. Patient is here for ongoing evaluation and continuation of therapy.  No bony pain appetite has been stable.  No  chills or fever \Patient is taking letrozole and IBRANCE. Bony pains have improved.  Had a complete evaluation with PET scan and plain x-ray of the femur. Patient continues to a myelosuppression As of one episode of diarrhea and low-grade fever with Levaquin and gradually got better. Here for further follow-up and treatment consideration no bony pain.  REVIEW OF SYSTEMS:   GENERAL:  Feels good.  Active.  No fevers, sweats or weight loss. PERFORMANCE STATUS (ECOG):0 HEENT:  No visual changes, runny nose, sore throat, mouth sores or tenderness. Lungs: No shortness of breath or cough.  No hemoptysis. Cardiac:  No chest pain, palpitations, orthopnea, or PND. GI:  No nausea, vomiting, diarrhea, constipation, melena or hematochezia. GU:  No urgency, frequency, dysuria, or hematuria. Musculoskeletal:  No back pain.  No joint pain.  No muscle tenderness. Extremities:  No pain or swelling. Skin:  No rashes or skin changes. Neuro:  No headache, numbness or weakness, balance or coordination issues. Endocrine:  No diabetes, thyroid issues, hot flashes or night sweats. Psych:  No mood changes, depression or anxiety. Pain:  No focal pain. Review of systems:  All other systems reviewed and found to be negative.  As per HPI. Otherwise, a complete review of systems is negatve.  PAST MEDICAL HISTORY: Past Medical History  Diagnosis Date  . Cancer of breast ([place]) 05/04/2015  . Breast cancer ([place])     PAST SURGICAL HISTORY: No past surgical history on file.  FAMILY HISTORY No family history on file. Preventive Screening:  Has patient had any of the following test? Colonscopy  Mammography  Pap Smear (1)   Last Colonoscopy: Never(1)   Last Mammography: 2015(1)   Last Pap Smear: Nov 2015(1)   Smoking History: Smoking History Never Smoked.(1)  PFSH: Family History: noncontributory  Social History: negative alcohol, negative tobacco  Additional Past Medical and Surgical History: no other  major medical illness  ECOLOGIC HISTORY:  No LMP recorded (approximate). Patient is postmenopausal.     ADVANCED DIRECTIVES: Patient does not have any advanced healthcare directive. Information has been given.   HEALTH MAINTENANCE: Social History  Substance Use Topics  . Smoking status: Never Smoker   . Smokeless tobacco: Never Used  . Alcohol Use: No      Allergies  Allergen Reactions  . Beef Extract Anaphylaxis  . Pork (Porcine) Protein Anaphylaxis  . Beef-Derived Products Hives    Current Outpatient Prescriptions  Medication Sig Dispense Refill  . Ascorbic Acid (VITAMIN C) 100 MG tablet Take 100 mg by mouth daily.    . calcium-vitamin D (OSCAL WITH D) 500-200 MG-UNIT per tablet Take 1 tablet by mouth 3 (three) times daily.    Marland Kitchen letrozole (FEMARA) 2.5 MG tablet TAKE ONE TABLET BY MOUTH EVERY DAY. 30 tablet 5  . Multiple Vitamins-Minerals (MULTIVITAMIN WITH MINERALS) tablet Take 1 tablet by mouth daily.    . palbociclib (IBRANCE) 100 MG capsule Take 1 capsule (100 mg total) by mouth daily. Take for 21 days then 7 days off. 21 capsule 3   No current facility-administered medications for this visit.    OBJECTIVE:  Filed Vitals:   01/11/16 1105  BP: 147/88  Pulse: 71  Temp: 97.7 F (36.5 C)  Resp: 18     Body mass index is 27.15 kg/(m^2).    ECOG FS:0 - Asymptomatic  PHYSICAL EXAM: GENERAL:  Well developed, well nourished, sitting comfortably in the exam room in no acute distress. MENTAL STATUS:  Alert and oriented to person, place and time. HEAD:   Normocephalic, atraumatic, face symmetric, no Cushingoid features. EYES:    Pupils equal round and reactive to light and accomodation.  No conjunctivitis or scleral icterus. ENT:  Oropharynx clear without lesion.  Tongue normal. Mucous membranes moist.  RESPIRATORY:  Clear to auscultation without rales, wheezes or rhonchi. CARDIOVASCULAR:  Regular rate and rhythm without murmur, rub or gallop. BREAST:  Right breast :  Palpable mass is decreased in size. NO skin changes or nipple discharge.  Left breast without masses, skin changes or nipple discharge. ABDOMEN:  Soft, non-tender, with active bowel sounds, and no hepatosplenomegaly.  No masses. BACK:  No CVA tenderness.  No tenderness on percussion of the back or rib cage. SKIN:  No rashes, ulcers or lesions. EXTREMITIES: No edema, no skin discoloration or tenderness.  No palpable cords. LYMPH NODES: No palpable cervical, supraclavicular, axillary or inguinal adenopathy  NEUROLOGICAL: Unremarkable. PSYCH:  Appropriate.   LAB RESULTS:  Appointment on 01/11/2016  Component Date Value Ref Range Status  . WBC 01/11/2016 1.7* 3.6 - 11.0 K/uL Final  . RBC 01/11/2016 3.28* 3.80 - 5.20 MIL/uL Final  . Hemoglobin 01/11/2016 11.6* 12.0 - 16.0 g/dL Final  . HCT 01/11/2016 34.0* 35.0 - 47.0 % Final  . MCV 01/11/2016 103.8* 80.0 - 100.0 fL Final  . MCH 01/11/2016 35.4* 26.0 - 34.0 pg Final  . MCHC 01/11/2016 34.1  32.0 - 36.0 g/dL Final  . RDW 01/11/2016 14.9* 11.5 - 14.5 % Final  . Platelets 01/11/2016 182  150 - 440  K/uL Final  . Neutrophils Relative % 01/11/2016 56   Final  . Neutro Abs 01/11/2016 1.0* 1.4 - 6.5 K/uL Final  . Lymphocytes Relative 01/11/2016 33   Final  . Lymphs Abs 01/11/2016 0.6* 1.0 - 3.6 K/uL Final  . Monocytes Relative 01/11/2016 5   Final  . Monocytes Absolute 01/11/2016 0.1* 0.2 - 0.9 K/uL Final  . Eosinophils Relative 01/11/2016 2   Final  . Eosinophils Absolute 01/11/2016 0.0  0 - 0.7 K/uL Final  . Basophils Relative 01/11/2016 4   Final  . Basophils Absolute 01/11/2016 0.1  0 - 0.1 K/uL Final  . Sodium 01/11/2016 135  135 - 145 mmol/L Final  . Potassium 01/11/2016 4.1  3.5 - 5.1 mmol/L Final  . Chloride 01/11/2016 101  101 - 111 mmol/L Final  . CO2 01/11/2016 28  22 - 32 mmol/L Final  . Glucose, Bld 01/11/2016 107* 65 - 99 mg/dL Final  . BUN 01/11/2016 12  6 - 20 mg/dL Final  . Creatinine, Ser 01/11/2016 0.90  0.44 - 1.00  mg/dL Final  . Calcium 01/11/2016 9.2  8.9 - 10.3 mg/dL Final  . Total Protein 01/11/2016 7.3  6.5 - 8.1 g/dL Final  . Albumin 01/11/2016 4.2  3.5 - 5.0 g/dL Final  . AST 01/11/2016 16  15 - 41 U/L Final  . ALT 01/11/2016 20  14 - 54 U/L Final  . Alkaline Phosphatase 01/11/2016 48  38 - 126 U/L Final  . Total Bilirubin 01/11/2016 0.5  0.3 - 1.2 mg/dL Final  . GFR calc non Af Amer 01/11/2016 >60  >60 mL/min Final  . GFR calc Af Amer 01/11/2016 >60  >60 mL/min Final   Comment: (NOTE) The eGFR has been calculated using the CKD EPI equation. This calculation has not been validated in all clinical situations. eGFR's persistently <60 mL/min signify possible Chronic Kidney Disease.   . Anion gap 01/11/2016 6  5 - 15 Final  . 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  CT scan, PET scan, plain x-ray and a mammogram is been reviewed independently and reviewed with the patient.  Overall there is no progressive disease.  Tumor markers are slightly elevated and will be continued to follow.  I reassured patient that overall there is a significant control over the disease and we should continue    IBRANCE and letrozole.  Myelosuppression secondary to [facility].  Patient will continue   IBRANCE ent reduced dose. Myelosuppression with 1 episode of fever RESPONDED TO OUTPATIENT TREATMENT./       was instructed to call me if spikes fever   MEDICAL DECISION MAKING:  Lab data has been reviewed.\Tumor markers are slowly declining  Continue IBRANCE and letrozole Myelosuppression secondary to [facility] but no evidence of any infection or fever. Patient  is myelosuppressed secondary to chemotherapy.  [facility]) Was advised to call me if spikes fever more than 100.  Or any  chills or fever.  In January XGEVA and IBRANCE and letrozole aLL LAB DATA HAS BEEN REVIEWED. cALCIUM IS WITHIN NORMAL LIMIT If tumor markers Rising Then PET Scan Would Be Ordered.  Legally patient has been stable without any new symptoms at present time Palpable right breast mass is stable at present time Levaquin 500 mg for  7 days was given to the patient in case if she spikes fever  Patient expressed understanding and was in agreement with this plan. She also understands that She can call clinic at any time with any questions, concerns, or complaints.    Cancer of breast   Staging form: Breast, AJCC 7th Edition     Clinical: Stage IV (T1b, N0, M1) - Signed by Forest Gleason, MD on 05/09/2015   Forest Gleason, MD   01/11/2016 11:13 AM

## 2016-01-12 LAB — CANCER ANTIGEN 27.29: CA 27.29: 85.4 U/mL — ABNORMAL HIGH (ref 0.0–38.6)

## 2016-01-16 ENCOUNTER — Telehealth: Payer: Self-pay | Admitting: *Deleted

## 2016-01-16 NOTE — Telephone Encounter (Signed)
Per MD, pt's ca27.29 is stable. Please notify.

## 2016-01-16 NOTE — Telephone Encounter (Signed)
Called patient and left message that CA-27.29 is stable.

## 2016-02-08 ENCOUNTER — Inpatient Hospital Stay: Payer: BLUE CROSS/BLUE SHIELD | Attending: Oncology

## 2016-02-08 ENCOUNTER — Inpatient Hospital Stay: Payer: BLUE CROSS/BLUE SHIELD

## 2016-02-08 VITALS — BP 130/78 | HR 63 | Temp 98.4°F

## 2016-02-08 DIAGNOSIS — C7951 Secondary malignant neoplasm of bone: Secondary | ICD-10-CM | POA: Insufficient documentation

## 2016-02-08 DIAGNOSIS — C50811 Malignant neoplasm of overlapping sites of right female breast: Secondary | ICD-10-CM | POA: Diagnosis present

## 2016-02-08 DIAGNOSIS — C50919 Malignant neoplasm of unspecified site of unspecified female breast: Secondary | ICD-10-CM

## 2016-02-08 DIAGNOSIS — Z170 Estrogen receptor positive status [ER+]: Secondary | ICD-10-CM | POA: Insufficient documentation

## 2016-02-08 DIAGNOSIS — Z923 Personal history of irradiation: Secondary | ICD-10-CM | POA: Diagnosis not present

## 2016-02-08 DIAGNOSIS — Z79811 Long term (current) use of aromatase inhibitors: Secondary | ICD-10-CM | POA: Diagnosis not present

## 2016-02-08 DIAGNOSIS — Z9221 Personal history of antineoplastic chemotherapy: Secondary | ICD-10-CM | POA: Diagnosis not present

## 2016-02-08 LAB — CBC WITH DIFFERENTIAL/PLATELET
BASOS ABS: 0 10*3/uL (ref 0–0.1)
BASOS PCT: 1 %
Eosinophils Absolute: 0 10*3/uL (ref 0–0.7)
Eosinophils Relative: 1 %
HEMATOCRIT: 32.2 % — AB (ref 35.0–47.0)
HEMOGLOBIN: 11.2 g/dL — AB (ref 12.0–16.0)
Lymphocytes Relative: 43 %
Lymphs Abs: 0.7 10*3/uL — ABNORMAL LOW (ref 1.0–3.6)
MCH: 35.9 pg — ABNORMAL HIGH (ref 26.0–34.0)
MCHC: 34.8 g/dL (ref 32.0–36.0)
MCV: 103.2 fL — ABNORMAL HIGH (ref 80.0–100.0)
MONOS PCT: 8 %
Monocytes Absolute: 0.1 10*3/uL — ABNORMAL LOW (ref 0.2–0.9)
NEUTROS ABS: 0.8 10*3/uL — AB (ref 1.4–6.5)
NEUTROS PCT: 47 %
Platelets: 148 10*3/uL — ABNORMAL LOW (ref 150–440)
RBC: 3.12 MIL/uL — AB (ref 3.80–5.20)
RDW: 15.8 % — ABNORMAL HIGH (ref 11.5–14.5)
WBC: 1.7 10*3/uL — AB (ref 3.6–11.0)

## 2016-02-08 LAB — CREATININE, SERUM: CREATININE: 0.86 mg/dL (ref 0.44–1.00)

## 2016-02-08 LAB — CALCIUM: CALCIUM: 9.8 mg/dL (ref 8.9–10.3)

## 2016-02-08 MED ORDER — DENOSUMAB 120 MG/1.7ML ~~LOC~~ SOLN
120.0000 mg | Freq: Once | SUBCUTANEOUS | Status: AC
  Administered 2016-02-08: 120 mg via SUBCUTANEOUS

## 2016-02-08 NOTE — Progress Notes (Unsigned)
Xgeva given in left arm, patient denies pain or discomfort, vitals documented

## 2016-02-14 ENCOUNTER — Telehealth: Payer: Self-pay | Admitting: *Deleted

## 2016-02-14 NOTE — Telephone Encounter (Signed)
Fax received and will fax back once completed.

## 2016-02-14 NOTE — Telephone Encounter (Signed)
A fax was sent to clarify [facility] request.  If there are questions please call [phone].

## 2016-03-07 ENCOUNTER — Inpatient Hospital Stay: Payer: BLUE CROSS/BLUE SHIELD

## 2016-03-07 DIAGNOSIS — C50811 Malignant neoplasm of overlapping sites of right female breast: Secondary | ICD-10-CM | POA: Diagnosis not present

## 2016-03-07 DIAGNOSIS — C50919 Malignant neoplasm of unspecified site of unspecified female breast: Secondary | ICD-10-CM

## 2016-03-07 LAB — CBC WITH DIFFERENTIAL/PLATELET
Basophils Absolute: 0 10*3/uL (ref 0–0.1)
EOS ABS: 0 10*3/uL (ref 0–0.7)
Eosinophils Relative: 2 %
HCT: 33.9 % — ABNORMAL LOW (ref 35.0–47.0)
Hemoglobin: 11.7 g/dL — ABNORMAL LOW (ref 12.0–16.0)
Lymphocytes Relative: 42 %
Lymphs Abs: 0.6 10*3/uL — ABNORMAL LOW (ref 1.0–3.6)
MCH: 35.8 pg — ABNORMAL HIGH (ref 26.0–34.0)
MCHC: 34.6 g/dL (ref 32.0–36.0)
MCV: 103.4 fL — ABNORMAL HIGH (ref 80.0–100.0)
MONO ABS: 0.1 10*3/uL — AB (ref 0.2–0.9)
Neutro Abs: 0.7 10*3/uL — ABNORMAL LOW (ref 1.4–6.5)
Platelets: 184 10*3/uL (ref 150–440)
RBC: 3.28 MIL/uL — ABNORMAL LOW (ref 3.80–5.20)
RDW: 16.4 % — AB (ref 11.5–14.5)
WBC: 1.4 10*3/uL — CL (ref 3.6–11.0)

## 2016-03-07 LAB — CALCIUM: CALCIUM: 8.8 mg/dL — AB (ref 8.9–10.3)

## 2016-03-07 LAB — CREATININE, SERUM
CREATININE: 0.78 mg/dL (ref 0.44–1.00)
GFR calc Af Amer: >60 mL/min (ref >60)
GFR calc non Af Amer: >60 mL/min (ref >60)

## 2016-03-07 MED ORDER — DENOSUMAB 120 MG/1.7ML ~~LOC~~ SOLN
120.0000 mg | Freq: Once | SUBCUTANEOUS | Status: AC
  Administered 2016-03-07: 120 mg via SUBCUTANEOUS
  Filled 2016-03-07: qty 1.7

## 2016-03-08 ENCOUNTER — Other Ambulatory Visit: Payer: Self-pay | Admitting: Family Medicine

## 2016-03-08 ENCOUNTER — Encounter: Payer: Self-pay | Admitting: *Deleted

## 2016-03-08 NOTE — Progress Notes (Signed)
  Oncology Nurse Navigator Documentation  Navigator Location: [facility] (03/08/16 1100) Navigator Encounter Type: Telephone (03/08/16 1100)           Patient Visit Type: Follow-up (03/08/16 1100)   Barriers/Navigation Needs: Coordination of Care (03/08/16 1100)   Interventions: Coordination of Care (03/08/16 1100)            Acuity: Level 2 (03/08/16 1100)         Time Spent with Patient: 30 (03/08/16 1100)   Patient called to get her lab results from yesterday.  Noted that her WBC was 1.4 and ANC is 0.7.  Last time WBC was this low was in January 2016.  Notified Georgeanne Nim, NP.  Patient is to stop Ibrance if she becomes febrile and notify the cancer center.  Otherwise she is to return for Labs next Wednesday at 11:30.  Patient is agreeable to the plan.

## 2016-03-14 ENCOUNTER — Inpatient Hospital Stay: Payer: BLUE CROSS/BLUE SHIELD | Attending: Oncology

## 2016-03-14 DIAGNOSIS — C7951 Secondary malignant neoplasm of bone: Secondary | ICD-10-CM | POA: Diagnosis not present

## 2016-03-14 DIAGNOSIS — D759 Disease of blood and blood-forming organs, unspecified: Secondary | ICD-10-CM | POA: Diagnosis not present

## 2016-03-14 DIAGNOSIS — Z170 Estrogen receptor positive status [ER+]: Secondary | ICD-10-CM | POA: Diagnosis not present

## 2016-03-14 DIAGNOSIS — Z9221 Personal history of antineoplastic chemotherapy: Secondary | ICD-10-CM | POA: Diagnosis not present

## 2016-03-14 DIAGNOSIS — Z79899 Other long term (current) drug therapy: Secondary | ICD-10-CM | POA: Diagnosis not present

## 2016-03-14 DIAGNOSIS — C50811 Malignant neoplasm of overlapping sites of right female breast: Secondary | ICD-10-CM | POA: Insufficient documentation

## 2016-03-14 DIAGNOSIS — C50919 Malignant neoplasm of unspecified site of unspecified female breast: Secondary | ICD-10-CM

## 2016-03-14 DIAGNOSIS — Z923 Personal history of irradiation: Secondary | ICD-10-CM | POA: Insufficient documentation

## 2016-03-14 DIAGNOSIS — Z780 Asymptomatic menopausal state: Secondary | ICD-10-CM | POA: Insufficient documentation

## 2016-03-14 DIAGNOSIS — Z79811 Long term (current) use of aromatase inhibitors: Secondary | ICD-10-CM | POA: Insufficient documentation

## 2016-03-14 LAB — CBC WITH DIFFERENTIAL/PLATELET
BASOS ABS: 0 10*3/uL (ref 0–0.1)
BASOS PCT: 2 %
Eosinophils Absolute: 0 10*3/uL (ref 0–0.7)
Eosinophils Relative: 1 %
HEMATOCRIT: 32.5 % — AB (ref 35.0–47.0)
HEMOGLOBIN: 11.4 g/dL — AB (ref 12.0–16.0)
Lymphocytes Relative: 45 %
Lymphs Abs: 0.8 10*3/uL — ABNORMAL LOW (ref 1.0–3.6)
MCH: 36 pg — ABNORMAL HIGH (ref 26.0–34.0)
MCHC: 35 g/dL (ref 32.0–36.0)
MCV: 103 fL — ABNORMAL HIGH (ref 80.0–100.0)
MONOS PCT: 12 %
Monocytes Absolute: 0.2 10*3/uL (ref 0.2–0.9)
NEUTROS ABS: 0.7 10*3/uL — AB (ref 1.4–6.5)
NEUTROS PCT: 40 %
Platelets: 123 10*3/uL — ABNORMAL LOW (ref 150–440)
RBC: 3.16 MIL/uL — AB (ref 3.80–5.20)
RDW: 16.7 % — ABNORMAL HIGH (ref 11.5–14.5)
WBC: 1.7 10*3/uL — AB (ref 3.6–11.0)

## 2016-03-15 ENCOUNTER — Encounter: Payer: Self-pay | Admitting: *Deleted

## 2016-03-15 NOTE — Progress Notes (Signed)
Patient called to get her lab results from yesterday.  Discussed that they were still low, but stable.  Informed her WBC was back up to 1.7 and neutrophils are stable.  She is to call if she has questions or needs.

## 2016-04-04 ENCOUNTER — Other Ambulatory Visit: Payer: Self-pay | Admitting: *Deleted

## 2016-04-04 ENCOUNTER — Inpatient Hospital Stay (HOSPITAL_BASED_OUTPATIENT_CLINIC_OR_DEPARTMENT_OTHER): Payer: BLUE CROSS/BLUE SHIELD | Admitting: Oncology

## 2016-04-04 ENCOUNTER — Inpatient Hospital Stay: Payer: BLUE CROSS/BLUE SHIELD

## 2016-04-04 ENCOUNTER — Encounter: Payer: Self-pay | Admitting: Oncology

## 2016-04-04 VITALS — BP 161/92 | HR 62 | Temp 96.1°F | Resp 18 | Wt 153.0 lb

## 2016-04-04 DIAGNOSIS — Z923 Personal history of irradiation: Secondary | ICD-10-CM

## 2016-04-04 DIAGNOSIS — C50811 Malignant neoplasm of overlapping sites of right female breast: Secondary | ICD-10-CM

## 2016-04-04 DIAGNOSIS — D759 Disease of blood and blood-forming organs, unspecified: Secondary | ICD-10-CM

## 2016-04-04 DIAGNOSIS — Z9221 Personal history of antineoplastic chemotherapy: Secondary | ICD-10-CM

## 2016-04-04 DIAGNOSIS — C7951 Secondary malignant neoplasm of bone: Secondary | ICD-10-CM | POA: Diagnosis not present

## 2016-04-04 DIAGNOSIS — Z170 Estrogen receptor positive status [ER+]: Secondary | ICD-10-CM

## 2016-04-04 DIAGNOSIS — C50919 Malignant neoplasm of unspecified site of unspecified female breast: Secondary | ICD-10-CM

## 2016-04-04 DIAGNOSIS — Z79899 Other long term (current) drug therapy: Secondary | ICD-10-CM

## 2016-04-04 DIAGNOSIS — Z79811 Long term (current) use of aromatase inhibitors: Secondary | ICD-10-CM

## 2016-04-04 DIAGNOSIS — Z780 Asymptomatic menopausal state: Secondary | ICD-10-CM

## 2016-04-04 LAB — COMPREHENSIVE METABOLIC PANEL
ALK PHOS: 48 U/L (ref 38–126)
ALT: 24 U/L (ref 14–54)
AST: 22 U/L (ref 15–41)
Albumin: 4.6 g/dL (ref 3.5–5.0)
Anion gap: 8 (ref 5–15)
BUN: 15 mg/dL (ref 6–20)
CALCIUM: 9.6 mg/dL (ref 8.9–10.3)
CHLORIDE: 104 mmol/L (ref 101–111)
CO2: 28 mmol/L (ref 22–32)
CREATININE: 0.83 mg/dL (ref 0.44–1.00)
GFR calc non Af Amer: >60 mL/min (ref >60)
GLUCOSE: 98 mg/dL (ref 65–99)
Potassium: 4.3 mmol/L (ref 3.5–5.1)
SODIUM: 140 mmol/L (ref 135–145)
Total Bilirubin: 0.4 mg/dL (ref 0.3–1.2)
Total Protein: 7.3 g/dL (ref 6.5–8.1)

## 2016-04-04 LAB — CBC WITH DIFFERENTIAL/PLATELET
BASOS PCT: 2 %
Basophils Absolute: 0 10*3/uL (ref 0–0.1)
EOS ABS: 0 10*3/uL (ref 0–0.7)
EOS PCT: 2 %
HCT: 33 % — ABNORMAL LOW (ref 35.0–47.0)
Hemoglobin: 11.4 g/dL — ABNORMAL LOW (ref 12.0–16.0)
LYMPHS ABS: 0.7 10*3/uL — AB (ref 1.0–3.6)
Lymphocytes Relative: 40 %
MCH: 35.9 pg — AB (ref 26.0–34.0)
MCHC: 34.7 g/dL (ref 32.0–36.0)
MCV: 103.5 fL — ABNORMAL HIGH (ref 80.0–100.0)
MONO ABS: 0.1 10*3/uL — AB (ref 0.2–0.9)
MONOS PCT: 7 %
NEUTROS PCT: 49 %
Neutro Abs: 0.8 10*3/uL — ABNORMAL LOW (ref 1.4–6.5)
PLATELETS: 178 10*3/uL (ref 150–440)
RBC: 3.18 MIL/uL — ABNORMAL LOW (ref 3.80–5.20)
RDW: 16.8 % — AB (ref 11.5–14.5)
WBC: 1.7 10*3/uL — ABNORMAL LOW (ref 3.6–11.0)

## 2016-04-04 MED ORDER — DENOSUMAB 120 MG/1.7ML ~~LOC~~ SOLN
120.0000 mg | Freq: Once | SUBCUTANEOUS | Status: AC
  Administered 2016-04-04: 120 mg via SUBCUTANEOUS
  Filled 2016-04-04: qty 1.7

## 2016-04-04 NOTE — Progress Notes (Signed)
[place] @ [facility] [phone]) [phone]  Fax:(336) [place] OB: 1956/07/06  MR#: [id number]  AGT#:[id number]  Patient Care Team: Gerilyn Pilgrim, FNP as PCP - General (Nurse Practitioner)  CHIEF COMPLAINT:  Chief Complaint  Patient presents with  . Breast Cancer   Oncology History   1carcinoma of breast biopsy done from the mass at 9:00 position (right breast) at [facility] on October 20, 2014.   T1 cN0 M1 stage IV disease [id number] biopsies positive for invasive mammary carcinoma with both Dr. lobular features.  Estrogen receptor +94%.  Progesterone receptor +95%.  HER-2/neu receptor -0. 2.  X-rays of the lumbar spine done in now September of 2015 revealed diffuse sclerotic bone lesion. 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  Liver is normal.  There are multiple sub-centimeter porta hepatis and pelvic and inguinal lymphadenopathy 4.  Recent has received 2 weeks of radiation therapy to the right hip at [facility] 5.  Has been started on letrozole 3 weeks ago (November 22, 2014) 6.recent has been switched to [facility] and letrozole 2 weeks ago starting from January 13 (IBRANCE 125 mg by mouth for 3 weeks with one week off ) dose of IBRANCE has been reduced 100 mg because of neutropenia      Oncology Flowsheet 09/21/2015 10/19/2015 11/16/2015 12/14/2015 01/11/2016 02/08/2016 03/07/2016  denosumab (XGEVA) [place] 120 mg 120 mg 120 mg 120 mg 120 mg 120 mg 120 mg    INTERVAL HISTORY:  60 year old lady came today for further follow-up regarding stage IV carcinoma breast.  Patient is taking IBRANCE, letrozole Also getting XGEVA.  No bony pain appetite has been fairly good.  Ambulation is improved.  No nausea.  No vomiting.  No diarrhea.. Patient is here for ongoing evaluation and continuation of therapy.  No bony pain appetite has been stable.  No  chills or fever \Patient is taking letrozole and IBRANCE. Bony pains have improved.  Had a complete evaluation with PET scan and plain x-ray of the femur. Patient continues to a myelosuppression As of one episode of diarrhea and low-grade fever with Levaquin and gradually got better. Here for further follow-up and treatment consideration no bony pain. Patient does not have any bone pain.  Appetite remains stable.  Patient is here for ongoing evaluation and treatment consideration. Getting XGEVA O every month.  REVIEW OF SYSTEMS:   GENERAL:  Feels good.  Active.  No fevers, sweats or weight loss. PERFORMANCE STATUS (ECOG):0 HEENT:  No visual changes, runny nose, sore throat, mouth sores or tenderness. Lungs: No shortness of breath or cough.  No hemoptysis. Cardiac:  No chest pain, palpitations, orthopnea, or PND. GI:  No nausea, vomiting, diarrhea, constipation, melena or hematochezia. GU:  No urgency, frequency, dysuria, or hematuria. Musculoskeletal:  No back pain.  No joint pain.  No muscle tenderness. Extremities:  No pain or swelling. Skin:  No rashes or skin changes. Neuro:  No headache, numbness or weakness, balance or coordination issues. Endocrine:  No diabetes, thyroid issues, hot flashes or night sweats. Psych:  No mood changes, depression or anxiety. Pain:  No focal pain. Review of systems:  All other systems reviewed and found to be negative.  As per HPI. Otherwise, a complete review of systems is negatve.  PAST MEDICAL HISTORY: Past Medical History  Diagnosis Date  . Cancer of breast ([place]) 05/04/2015  . Breast  cancer ([place])     PAST SURGICAL HISTORY: No past surgical history on file.  FAMILY HISTORY No family history on file. Preventive Screening:  Has patient had any of the following test? Colonscopy  Mammography  Pap Smear (1)   Last Colonoscopy: Never(1)   Last Mammography: 2015(1)   Last Pap Smear: Nov 2015(1)   Smoking History: Smoking History Never  Smoked.(1)  PFSH: Family History: noncontributory  Social History: negative alcohol, negative tobacco  Additional Past Medical and Surgical History: no other major medical illness  ECOLOGIC HISTORY:  No LMP recorded (approximate). Patient is postmenopausal.     ADVANCED DIRECTIVES: Patient does not have any advanced healthcare directive. Information has been given.   HEALTH MAINTENANCE: Social History  Substance Use Topics  . Smoking status: Never Smoker   . Smokeless tobacco: Never Used  . Alcohol Use: No      Allergies  Allergen Reactions  . Beef Extract Anaphylaxis  . Pork (Porcine) Protein Anaphylaxis  . Beef-Derived Products Hives    Current Outpatient Prescriptions  Medication Sig Dispense Refill  . Ascorbic Acid (VITAMIN C) 100 MG tablet Take 100 mg by mouth daily.    . calcium-vitamin D (OSCAL WITH D) 500-200 MG-UNIT per tablet Take 1 tablet by mouth 3 (three) times daily.    Marland Kitchen letrozole (FEMARA) 2.5 MG tablet TAKE ONE TABLET BY MOUTH EVERY DAY. 30 tablet 5  . levofloxacin (LEVAQUIN) 500 MG tablet Take 1 tablet (500 mg total) by mouth daily. 7 tablet 0  . Multiple Vitamins-Minerals (MULTIVITAMIN WITH MINERALS) tablet Take 1 tablet by mouth daily.    . palbociclib (IBRANCE) 100 MG capsule Take 1 capsule (100 mg total) by mouth daily. Take for 21 days then 7 days off. 21 capsule 3   No current facility-administered medications for this visit.    OBJECTIVE:  Filed Vitals:   04/04/16 1108  BP: 161/92  Pulse: 62  Temp: 96.1 F (35.6 C)  Resp: 18     Body mass index is 28.34 kg/(m^2).    ECOG FS:0 - Asymptomatic  PHYSICAL EXAM: GENERAL:  Well developed, well nourished, sitting comfortably in the exam room in no acute distress. MENTAL STATUS:  Alert and oriented to person, place and time. HEAD:   Normocephalic, atraumatic, face symmetric, no Cushingoid features. EYES:    Pupils equal round and reactive to light and accomodation.  No conjunctivitis or scleral  icterus. ENT:  Oropharynx clear without lesion.  Tongue normal. Mucous membranes moist.  RESPIRATORY:  Clear to auscultation without rales, wheezes or rhonchi. CARDIOVASCULAR:  Regular rate and rhythm without murmur, rub or gallop. BREAST:  Right breast : Palpable mass is decreased in size. NO skin changes or nipple discharge.  Left breast without masses, skin changes or nipple discharge. ABDOMEN:  Soft, non-tender, with active bowel sounds, and no hepatosplenomegaly.  No masses. BACK:  No CVA tenderness.  No tenderness on percussion of the back or rib cage. SKIN:  No rashes, ulcers or lesions. EXTREMITIES: No edema, no skin discoloration or tenderness.  No palpable cords. LYMPH NODES: No palpable cervical, supraclavicular, axillary or inguinal adenopathy  NEUROLOGICAL: Unremarkable. PSYCH:  Appropriate.   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  CT scan, PET scan, plain x-ray and a mammogram is been reviewed independently and reviewed with the patient.  Overall there is no progressive disease.  Tumor markers are slightly elevated and will be continued to follow.  I reassured patient that overall there is a significant control over the disease and we should continue    IBRANCE and letrozole.  Myelosuppression secondary to [facility].  Patient will continue   IBRANCE ent reduced dose. Myelosuppression with 1 episode of fever        was instructed to call me if spikes fever   MEDICAL DECISION MAKING:  Lab data has been  reviewed.\Tumor markers are slowly declining  Continue IBRANCE and letrozole Myelosuppression secondary to [facility] but no evidence of any infection or fever.  All lab data has been reviewed.  Neutrophil count remains low CEA to 7.29 remains stable Continue present approach Continue XGEVA return appointment in 2 months for reevaluation In my absence associate will evaluate the patient.  Patient is fully aware of my retirement plan Repeat PET scan on bone scan can be done if there rising tumor markers were patient has any new symptoms   Patient expressed understanding and was in agreement with this plan. She also understands that She can call clinic at any time with any questions, concerns, or complaints.    Cancer of breast   Staging form: Breast, AJCC 7th Edition     Clinical: Stage IV (T1b, N0, M1) - Signed by Forest Gleason, MD on 05/09/2015   Forest Gleason, MD   04/04/2016 11:28 AM

## 2016-04-05 LAB — CANCER ANTIGEN 27.29: CA 27.29: 97.6 U/mL — ABNORMAL HIGH (ref 0.0–38.6)

## 2016-04-06 ENCOUNTER — Telehealth: Payer: Self-pay | Admitting: *Deleted

## 2016-04-06 NOTE — Telephone Encounter (Signed)
Results for Ca27.29 given to patient and instructed pt that will continue to monitor markers at this time and will call back if MD decides treatment needs to change. Pt verbalized understanding.

## 2016-04-08 ENCOUNTER — Encounter: Payer: Self-pay | Admitting: Oncology

## 2016-04-11 ENCOUNTER — Encounter: Payer: Self-pay | Admitting: *Deleted

## 2016-04-11 ENCOUNTER — Telehealth: Payer: Self-pay | Admitting: *Deleted

## 2016-04-11 ENCOUNTER — Other Ambulatory Visit: Payer: Self-pay | Admitting: *Deleted

## 2016-04-11 DIAGNOSIS — C50911 Malignant neoplasm of unspecified site of right female breast: Secondary | ICD-10-CM

## 2016-04-11 MED ORDER — PALBOCICLIB 100 MG PO CAPS: 100.0000 mg | ORAL_CAPSULE | Freq: Every day | ORAL | Status: DC

## 2016-04-11 NOTE — Telephone Encounter (Signed)
ibrance has been escribed to [facility]

## 2016-04-11 NOTE — Progress Notes (Signed)
  Oncology Nurse Navigator Documentation  Navigator Location: [facility] (04/11/16 1500) Navigator Encounter Type: Telephone (04/11/16 1500)               Barriers/Navigation Needs: Coordination of Care (04/11/16 1500)   Interventions: Coordination of Care (04/11/16 1500)            Acuity: Level 1 (04/11/16 1500)         Time Spent with Patient: 15 (04/11/16 1500)   Patient called and needs a new prescription for her Ibrance.  Discussed with Trinda Pascal, RN, Dr. Metro Kung nurse and prescription was e-scribed.  Patient notified.

## 2016-04-11 NOTE — Telephone Encounter (Signed)
Patient states she has not received her oral chemo.  Called [facility] who told her they are waiting for her prescription to come back through.  Wants to know if we can check on this.  Will need medication tomorrow.

## 2016-04-13 NOTE — Telephone Encounter (Signed)
Spoke with patient who verified that she got her Ibrance.

## 2016-04-30 ENCOUNTER — Encounter: Payer: Self-pay | Admitting: *Deleted

## 2016-04-30 NOTE — Progress Notes (Signed)
  Oncology Nurse Navigator Documentation  Navigator Location: [facility] (04/30/16 1500) Navigator Encounter Type: Telephone (04/30/16 1500)               Barriers/Navigation Needs: Coordination of Care (04/30/16 1500)   Interventions: Coordination of Care (04/30/16 1500)            Acuity: Level 2 (04/30/16 1500)         Time Spent with Patient: 15 (04/30/16 1500)   Patient called and states she is having left hip pain.  States she thinks it's from riding to the beach and walking in the sand.  Rates her pain an 8.  She wants to ask Dr. Oliva Bustard what orthopedic doctor she should see.  Will send him an email since he is not here until Wednesday.

## 2016-05-02 ENCOUNTER — Inpatient Hospital Stay: Payer: BLUE CROSS/BLUE SHIELD | Attending: Internal Medicine

## 2016-05-02 ENCOUNTER — Inpatient Hospital Stay: Payer: BLUE CROSS/BLUE SHIELD

## 2016-05-02 DIAGNOSIS — C50919 Malignant neoplasm of unspecified site of unspecified female breast: Secondary | ICD-10-CM

## 2016-05-02 DIAGNOSIS — Z79811 Long term (current) use of aromatase inhibitors: Secondary | ICD-10-CM | POA: Insufficient documentation

## 2016-05-02 DIAGNOSIS — C7951 Secondary malignant neoplasm of bone: Secondary | ICD-10-CM | POA: Diagnosis not present

## 2016-05-02 DIAGNOSIS — Z923 Personal history of irradiation: Secondary | ICD-10-CM | POA: Diagnosis not present

## 2016-05-02 DIAGNOSIS — M79605 Pain in left leg: Secondary | ICD-10-CM | POA: Diagnosis not present

## 2016-05-02 DIAGNOSIS — Z170 Estrogen receptor positive status [ER+]: Secondary | ICD-10-CM | POA: Diagnosis not present

## 2016-05-02 DIAGNOSIS — Z9221 Personal history of antineoplastic chemotherapy: Secondary | ICD-10-CM | POA: Diagnosis not present

## 2016-05-02 DIAGNOSIS — R509 Fever, unspecified: Secondary | ICD-10-CM | POA: Diagnosis not present

## 2016-05-02 DIAGNOSIS — R197 Diarrhea, unspecified: Secondary | ICD-10-CM | POA: Insufficient documentation

## 2016-05-02 DIAGNOSIS — C50811 Malignant neoplasm of overlapping sites of right female breast: Secondary | ICD-10-CM | POA: Insufficient documentation

## 2016-05-02 DIAGNOSIS — Z79899 Other long term (current) drug therapy: Secondary | ICD-10-CM | POA: Diagnosis not present

## 2016-05-02 LAB — CALCIUM: Calcium: 9.1 mg/dL (ref 8.9–10.3)

## 2016-05-02 MED ORDER — DENOSUMAB 120 MG/1.7ML ~~LOC~~ SOLN
120.0000 mg | Freq: Once | SUBCUTANEOUS | Status: AC
  Administered 2016-05-02: 120 mg via SUBCUTANEOUS
  Filled 2016-05-02: qty 1.7

## 2016-05-03 ENCOUNTER — Encounter: Payer: Self-pay | Admitting: *Deleted

## 2016-05-03 NOTE — Progress Notes (Signed)
  Oncology Nurse Navigator Documentation  Navigator Location: [facility] (05/03/16 1100) Navigator Encounter Type: Telephone (05/03/16 1100) Telephone: Outgoing Call (05/03/16 1100)             Barriers/Navigation Needs: Coordination of Care (05/03/16 1100)   Interventions: Coordination of Care (05/03/16 1100)            Acuity: Level 2 (05/03/16 1100)         Time Spent with Patient: 30 (05/03/16 1100)   Talked with Dr. Oliva Bustard yesterday in regards to patient's hip pain and he would like to see her next week.  Called patient with appointment for Wednesday, May 31st at 10:30.  States she is better, but will keep her appointment with him.

## 2016-05-09 ENCOUNTER — Inpatient Hospital Stay (HOSPITAL_BASED_OUTPATIENT_CLINIC_OR_DEPARTMENT_OTHER): Payer: BLUE CROSS/BLUE SHIELD | Admitting: Oncology

## 2016-05-09 ENCOUNTER — Other Ambulatory Visit: Payer: Self-pay

## 2016-05-09 VITALS — BP 161/69 | HR 65 | Temp 96.9°F | Resp 18 | Wt 151.7 lb

## 2016-05-09 DIAGNOSIS — M545 Low back pain: Secondary | ICD-10-CM

## 2016-05-09 DIAGNOSIS — C7951 Secondary malignant neoplasm of bone: Secondary | ICD-10-CM

## 2016-05-09 DIAGNOSIS — Z9221 Personal history of antineoplastic chemotherapy: Secondary | ICD-10-CM

## 2016-05-09 DIAGNOSIS — C50919 Malignant neoplasm of unspecified site of unspecified female breast: Secondary | ICD-10-CM

## 2016-05-09 DIAGNOSIS — Z923 Personal history of irradiation: Secondary | ICD-10-CM

## 2016-05-09 DIAGNOSIS — M79605 Pain in left leg: Secondary | ICD-10-CM

## 2016-05-09 DIAGNOSIS — Z170 Estrogen receptor positive status [ER+]: Secondary | ICD-10-CM

## 2016-05-09 DIAGNOSIS — R509 Fever, unspecified: Secondary | ICD-10-CM

## 2016-05-09 DIAGNOSIS — M25552 Pain in left hip: Secondary | ICD-10-CM

## 2016-05-09 DIAGNOSIS — Z79811 Long term (current) use of aromatase inhibitors: Secondary | ICD-10-CM | POA: Diagnosis not present

## 2016-05-09 DIAGNOSIS — C50811 Malignant neoplasm of overlapping sites of right female breast: Secondary | ICD-10-CM | POA: Diagnosis not present

## 2016-05-09 DIAGNOSIS — R197 Diarrhea, unspecified: Secondary | ICD-10-CM

## 2016-05-09 DIAGNOSIS — Z79899 Other long term (current) drug therapy: Secondary | ICD-10-CM

## 2016-05-09 MED ORDER — GABAPENTIN 300 MG PO CAPS: 300.0000 mg | ORAL_CAPSULE | Freq: Two times a day (BID) | ORAL | Status: DC

## 2016-05-09 NOTE — Progress Notes (Signed)
Patient here today for pain in left leg when she is sitting or standing.  States the pain goes away when she is walking or moving her leg.

## 2016-05-10 ENCOUNTER — Telehealth: Payer: Self-pay | Admitting: *Deleted

## 2016-05-10 ENCOUNTER — Encounter: Payer: Self-pay | Admitting: *Deleted

## 2016-05-10 DIAGNOSIS — C50919 Malignant neoplasm of unspecified site of unspecified female breast: Secondary | ICD-10-CM

## 2016-05-10 LAB — CANCER ANTIGEN 27.29: CA 27.29: 127.2 U/mL — ABNORMAL HIGH (ref 0.0–38.6)

## 2016-05-10 NOTE — Telephone Encounter (Signed)
Pt has rising tumor markers. Ca27.29 is at 122. Dr. Oliva Bustard wants to schedule PET scan and see choksi on 6/8 for results. Scheduling will notify pt with appt details.

## 2016-05-10 NOTE — Telephone Encounter (Signed)
Per MD pt is aware of these results. PET scan will be scheduled and pt to be notified by scheduling.

## 2016-05-10 NOTE — Progress Notes (Signed)
  Oncology Nurse Navigator Documentation  Navigator Location: [facility] (05/10/16 1400) Navigator Encounter Type: Telephone (05/10/16 1400) Telephone: Incoming Call (05/10/16 1400)             Barriers/Navigation Needs: Coordination of Care (05/10/16 1400)   Interventions: Coordination of Care (05/10/16 1400)            Acuity: Level 2 (05/10/16 1400)         Time Spent with Patient: 45 (05/10/16 1400)   Patient called today and wanted her lab results.  CEA is elevated at 127.7.  Patient asked about next steps.  Informed her I would discuss with Dr. Oliva Bustard and call her back.  Called patient back and told her per my discussion with Dr. Oliva Bustard, he would like to get a PET scan, and based on those results he could start her on some additional meds or change them all together.  She is to call me Monday if she has not received a call to schedule her PET.  She is agreeable.

## 2016-05-12 ENCOUNTER — Encounter: Payer: Self-pay | Admitting: Oncology

## 2016-05-12 NOTE — Progress Notes (Signed)
[place] @ [facility] Telephone:(336) [phone]  Fax:(336) [place] OB: May 28, 1956  MR#: [id number]  YYT#:[id number]  Patient Care Team: Gerilyn Pilgrim, FNP as PCP - General (Nurse Practitioner)  CHIEF COMPLAINT:  Chief Complaint  Patient presents with  . Breast Cancer   Oncology History   1carcinoma of breast biopsy done from the mass at 9:00 position (right breast) at [facility] on October 20, 2014.   T1 cN0 M1 stage IV disease [id number] biopsies positive for invasive mammary carcinoma with both Dr. lobular features.  Estrogen receptor +94%.  Progesterone receptor +95%.  HER-2/neu receptor -0. 2.  X-rays of the lumbar spine done in now September of 2015 revealed diffuse sclerotic bone lesion. 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  Liver is normal.  There are multiple sub-centimeter porta hepatis and pelvic and inguinal lymphadenopathy 4.  Recent has received 2 weeks of radiation therapy to the right hip at [facility] 5.  Has been started on letrozole 3 weeks ago (November 22, 2014) 6.recent has been switched to [facility] and letrozole 2 weeks ago starting from January 13 (IBRANCE 125 mg by mouth for 3 weeks with one week off ) dose of IBRANCE has been reduced 100 mg because of neutropenia      Oncology Flowsheet 11/16/2015 12/14/2015 01/11/2016 02/08/2016 03/07/2016 04/04/2016 05/02/2016  denosumab (XGEVA) [place] 120 _0  mg 120 mg    INTERVAL HISTORY:  60 year old lady came today for further follow-up regarding stage IV carcinoma breast.  Patient is taking IBRANCE, letrozole Also getting XGEVA.  No bony pain appetite has been fairly good.  Ambulation is improved.  No nausea.  No vomiting.  No diarrhea.. Patient is here for ongoing evaluation and continuation of therapy.  No bony pain appetite has been stable.  No  chills or fever \Patient is taking letrozole and IBRANCE. Bony pains have improved.  Had a complete evaluation with PET scan and plain x-ray of the femur. Patient continues to a myelosuppression As of one episode of diarrhea and low-grade fever with Levaquin and gradually got better. Here for further follow-up and treatment consideration no bony pain. Patient does not have any bone pain.  Appetite remains stable.  Patient is here for ongoing evaluation and treatment consideration. Getting XGEVA O every month.   Patient here today for pain in left leg when she is sitting or standing. States the pain goes away when she is walking or moving her leg    REVIEW OF SYSTEMS:   GENERAL:  Feels good.  Active.  No fevers, sweats or weight loss. PERFORMANCE STATUS (ECOG):0 HEENT:  No visual changes, runny nose, sore throat, mouth sores or tenderness. Lungs: No shortness of breath or cough.  No hemoptysis. Cardiac:  No chest pain, palpitations, orthopnea, or PND. GI:  No nausea, vomiting, diarrhea, constipation, melena or hematochezia. GU:  No urgency, frequency, dysuria, or hematuria. Musculoskeletal:  Pain in the left leg as described above.  Patient is not taking any pain medication Extremities:  No pain or swelling. Skin:  No rashes or skin changes. Neuro:  No headache, numbness or weakness, balance or coordination issues. Endocrine:  No diabetes, thyroid issues, hot flashes or night sweats. Psych:  No mood changes, depression or anxiety. Pain:  No focal pain. Review of systems:  All other systems reviewed and  found to be negative.  As per HPI. Otherwise, a complete review of systems is negatve.  PAST MEDICAL HISTORY: Past Medical History  Diagnosis Date  . Cancer of breast ([place]) 05/04/2015  . Breast cancer ([place])     PAST SURGICAL HISTORY: No past surgical history on file.  FAMILY HISTORY No family history on file. Preventive Screening:  Has patient had any of the following test?  Colonscopy  Mammography  Pap Smear (1)   Last Colonoscopy: Never(1)   Last Mammography: 2015(1)   Last Pap Smear: Nov 2015(1)   Smoking History: Smoking History Never Smoked.(1)  PFSH: Family History: noncontributory  Social History: negative alcohol, negative tobacco  Additional Past Medical and Surgical History: no other major medical illness  ECOLOGIC HISTORY:  No LMP recorded (approximate). Patient is postmenopausal.     ADVANCED DIRECTIVES: Patient does not have any advanced healthcare directive. Information has been given.   HEALTH MAINTENANCE: Social History  Substance Use Topics  . Smoking status: Never Smoker   . Smokeless tobacco: Never Used  . Alcohol Use: No      Allergies  Allergen Reactions  . Beef Extract Anaphylaxis  . Pork (Porcine) Protein Anaphylaxis  . Beef-Derived Products Hives    Current Outpatient Prescriptions  Medication Sig Dispense Refill  . Ascorbic Acid (VITAMIN C) 100 MG tablet Take 100 mg by mouth daily.    . calcium-vitamin D (OSCAL WITH D) 500-200 MG-UNIT per tablet Take 1 tablet by mouth 3 (three) times daily.    Marland Kitchen letrozole (FEMARA) 2.5 MG tablet TAKE ONE TABLET BY MOUTH EVERY DAY. 30 tablet 5  . levofloxacin (LEVAQUIN) 500 MG tablet Take 1 tablet (500 mg total) by mouth daily. 7 tablet 0  . Multiple Vitamins-Minerals (MULTIVITAMIN WITH MINERALS) tablet Take 1 tablet by mouth daily.    . palbociclib (IBRANCE) 100 MG capsule Take 1 capsule (100 mg total) by mouth daily. Take for 21 days then 7 days off. 21 capsule 11  . gabapentin (NEURONTIN) 300 MG capsule Take 1 capsule (300 mg total) by mouth 2 (two) times daily. 60 capsule 3   No current facility-administered medications for this visit.    OBJECTIVE:  Filed Vitals:   05/09/16 1043  BP: 161/69  Pulse: 65  Temp: 96.9 F (36.1 C)  Resp: 18     Body mass index is 28.09 kg/(m^2).    ECOG FS:0 - Asymptomatic  PHYSICAL EXAM: GENERAL:  Well developed, well nourished,  sitting comfortably in the exam room in no acute distress. MENTAL STATUS:  Alert and oriented to person, place and time. HEAD:   Normocephalic, atraumatic, face symmetric, no Cushingoid features. EYES:    Pupils equal round and reactive to light and accomodation.  No conjunctivitis or scleral icterus. ENT:  Oropharynx clear without lesion.  Tongue normal. Mucous membranes moist.  RESPIRATORY:  Clear to auscultation without rales, wheezes or rhonchi. CARDIOVASCULAR:  Regular rate and rhythm without murmur, rub or gallop. BREAST:  Right breast : Palpable mass is decreased in size. NO skin changes or nipple discharge.  Left breast without masses, skin changes or nipple discharge. ABDOMEN:  Soft, non-tender, with active bowel sounds, and no hepatosplenomegaly.  No masses. BACK:  No CVA tenderness.  No tenderness on percussion of the back or rib cage. SKIN:  No rashes, ulcers or lesions. EXTREMITIES: No edema, no skin discoloration or tenderness.  No palpable cords. LYMPH NODES: No palpable cervical, supraclavicular, axillary or inguinal adenopathy  NEUROLOGICAL: Unremarkable. PSYCH:  Appropriate.  LAB RESULTS:  CBC Latest Ref Rng 04/04/2016 03/14/2016 03/07/2016  WBC 3.6 - 11.0 K/uL 1.7(L) 1.7(L) 1.4(LL)  Hemoglobin 12.0 - 16.0 g/dL 11.4(L) 11.4(L) 11.7(L)  Hematocrit 35.0 - 47.0 % 33.0(L) 32.5(L) 33.9(L)  Platelets 150 - 440 K/uL 178 123(L) 184      69moago  632mogo       CA 27.29 0.0 - 38.6 U/mL 91.7 (H) 85.2 (H)CM 91.7 (H)CM          ASSESSMENT: [age]ear old lady with stage IV carcinoma breast  metastases to the bone On IBRANCE and letrozole  2.  New onset of bony pain.  MRI scan of the left hip and pelvis has been ordered. 3ca  27.29 is rising.  It is 127.2 Because of rising CA-27-29,    PET scan would be ordered to rule out any progressive metastases  Continue   XGEVA at present time    She will be  reevaluated after PET scan is done Consider possibility of radiation  therapy to the left hip or sacroiliac joint if there is evidence of metastases        Staging form: Breast, AJCC 7th Edition     Clinical: Stage IV (T1b, N0, M1) - Signed by JaForest GleasonMD on 05/09/2015   JaForest GleasonMD   05/12/2016 4:10 PM

## 2016-05-15 ENCOUNTER — Telehealth: Payer: Self-pay | Admitting: *Deleted

## 2016-05-15 ENCOUNTER — Encounter: Payer: Self-pay | Admitting: *Deleted

## 2016-05-15 ENCOUNTER — Other Ambulatory Visit: Payer: Self-pay | Admitting: *Deleted

## 2016-05-15 DIAGNOSIS — C50919 Malignant neoplasm of unspecified site of unspecified female breast: Secondary | ICD-10-CM

## 2016-05-15 DIAGNOSIS — R978 Other abnormal tumor markers: Secondary | ICD-10-CM

## 2016-05-15 DIAGNOSIS — M898X9 Other specified disorders of bone, unspecified site: Secondary | ICD-10-CM

## 2016-05-15 NOTE — Telephone Encounter (Signed)
auth dates given to pt were for the PET scan ordered in Dec. Request was cancelled for current PET so insurance will not have any auth on file for that one since it was denied. Dr. Oliva Bustard wants to proceed with CT and bone scan because that is the only scans they will approve right now.

## 2016-05-15 NOTE — Telephone Encounter (Signed)
Called patient back and she still is not understanding that she will not have the PET tomorrow.  I have called Marita Kansas in insurance and asked her to call patient to explain to her what was discussed with insurance company.

## 2016-05-15 NOTE — Telephone Encounter (Signed)
Patient states her PET scan was cancelled for tomorrow.   She thought it had to do with insurance but when she contacted them she was told the approval dates were 11-10-15 - 05-12-16.  States if someone will call them they can reshedule the PET for tomorrow.  Please call her back @ [phone] or [phone].

## 2016-05-15 NOTE — Progress Notes (Signed)
  Oncology Nurse Navigator Documentation  Navigator Location: [facility] (05/15/16 1300) Navigator Encounter Type: Telephone (05/15/16 1300) Telephone: Incoming Call (05/15/16 1300)             Barriers/Navigation Needs: Coordination of Care (05/15/16 1300)   Interventions: Coordination of Care (05/15/16 1300)            Acuity: Level 2 (05/15/16 1300)         Time Spent with Patient: 30 (05/15/16 1300)   Patient called questioning why she could not get her PET scan.  Talked with Marita [place] in pre-aurthorization, and explained to patient that per her insurance company she would have to have a CT scan and bone density prior to their approval for the PET scan.  Patient voices understanding.

## 2016-05-16 ENCOUNTER — Ambulatory Visit: Admission: RE | Admit: 2016-05-16 | Payer: BLUE CROSS/BLUE SHIELD | Source: Ambulatory Visit

## 2016-05-17 ENCOUNTER — Other Ambulatory Visit: Payer: Self-pay | Admitting: Oncology

## 2016-05-17 ENCOUNTER — Encounter: Payer: Self-pay | Admitting: *Deleted

## 2016-05-17 ENCOUNTER — Inpatient Hospital Stay: Payer: BLUE CROSS/BLUE SHIELD | Admitting: Oncology

## 2016-05-17 DIAGNOSIS — Z780 Asymptomatic menopausal state: Secondary | ICD-10-CM | POA: Insufficient documentation

## 2016-05-22 ENCOUNTER — Encounter
Admission: RE | Admit: 2016-05-22 | Discharge: 2016-05-22 | Disposition: A | Discharge status: Home / Self Care | Payer: BLUE CROSS/BLUE SHIELD | Source: Ambulatory Visit | Attending: Oncology | Admitting: Oncology

## 2016-05-22 ENCOUNTER — Encounter: Admission: RE | Admit: 2016-05-22 | Payer: BLUE CROSS/BLUE SHIELD | Source: Ambulatory Visit

## 2016-05-22 DIAGNOSIS — R978 Other abnormal tumor markers: Secondary | ICD-10-CM | POA: Diagnosis present

## 2016-05-22 DIAGNOSIS — M899 Disorder of bone, unspecified: Secondary | ICD-10-CM | POA: Insufficient documentation

## 2016-05-22 DIAGNOSIS — C50919 Malignant neoplasm of unspecified site of unspecified female breast: Secondary | ICD-10-CM | POA: Insufficient documentation

## 2016-05-22 DIAGNOSIS — M898X9 Other specified disorders of bone, unspecified site: Secondary | ICD-10-CM

## 2016-05-22 MED ORDER — TECHNETIUM TC 99M MEDRONATE IV KIT
25.0000 | PACK | Freq: Once | INTRAVENOUS | Status: AC | PRN
  Administered 2016-05-22: 22.66 via INTRAVENOUS

## 2016-05-25 ENCOUNTER — Ambulatory Visit
Admission: RE | Admit: 2016-05-25 | Discharge: 2016-05-25 | Disposition: A | Discharge status: Home / Self Care | Payer: BLUE CROSS/BLUE SHIELD | Source: Ambulatory Visit | Attending: Oncology | Admitting: Oncology

## 2016-05-25 DIAGNOSIS — R978 Other abnormal tumor markers: Secondary | ICD-10-CM | POA: Insufficient documentation

## 2016-05-25 DIAGNOSIS — R918 Other nonspecific abnormal finding of lung field: Secondary | ICD-10-CM | POA: Diagnosis not present

## 2016-05-25 DIAGNOSIS — C50919 Malignant neoplasm of unspecified site of unspecified female breast: Secondary | ICD-10-CM | POA: Insufficient documentation

## 2016-05-25 DIAGNOSIS — M898X9 Other specified disorders of bone, unspecified site: Secondary | ICD-10-CM

## 2016-05-25 DIAGNOSIS — M899 Disorder of bone, unspecified: Secondary | ICD-10-CM | POA: Insufficient documentation

## 2016-05-25 HISTORY — DX: Malignant neoplasm of unspecified site of right female breast: C50.911

## 2016-05-25 MED ORDER — IOPAMIDOL (ISOVUE-300) INJECTION 61%
100.0000 mL | Freq: Once | INTRAVENOUS | Status: AC | PRN
  Administered 2016-05-25: 100 mL via INTRAVENOUS

## 2016-05-27 ENCOUNTER — Encounter: Payer: Self-pay | Admitting: Internal Medicine

## 2016-05-28 ENCOUNTER — Ambulatory Visit
Admission: RE | Admit: 2016-05-28 | Discharge: 2016-05-28 | Disposition: A | Discharge status: Home / Self Care | Payer: BLUE CROSS/BLUE SHIELD | Source: Ambulatory Visit | Attending: Oncology | Admitting: Oncology

## 2016-05-28 DIAGNOSIS — C7951 Secondary malignant neoplasm of bone: Secondary | ICD-10-CM | POA: Insufficient documentation

## 2016-05-28 DIAGNOSIS — C50919 Malignant neoplasm of unspecified site of unspecified female breast: Secondary | ICD-10-CM | POA: Diagnosis present

## 2016-05-28 DIAGNOSIS — M545 Low back pain: Secondary | ICD-10-CM | POA: Insufficient documentation

## 2016-05-28 DIAGNOSIS — M25552 Pain in left hip: Secondary | ICD-10-CM | POA: Diagnosis present

## 2016-05-28 MED ORDER — GADOBENATE DIMEGLUMINE 529 MG/ML IV SOLN
15.0000 mL | Freq: Once | INTRAVENOUS | Status: AC | PRN
  Administered 2016-05-28: 14 mL via INTRAVENOUS

## 2016-05-29 ENCOUNTER — Other Ambulatory Visit: Payer: Self-pay | Admitting: *Deleted

## 2016-05-29 DIAGNOSIS — C50919 Malignant neoplasm of unspecified site of unspecified female breast: Secondary | ICD-10-CM

## 2016-05-30 ENCOUNTER — Inpatient Hospital Stay (HOSPITAL_BASED_OUTPATIENT_CLINIC_OR_DEPARTMENT_OTHER): Payer: BLUE CROSS/BLUE SHIELD | Admitting: Internal Medicine

## 2016-05-30 ENCOUNTER — Inpatient Hospital Stay: Payer: BLUE CROSS/BLUE SHIELD | Attending: Internal Medicine

## 2016-05-30 ENCOUNTER — Inpatient Hospital Stay: Payer: BLUE CROSS/BLUE SHIELD

## 2016-05-30 VITALS — BP 152/74 | HR 67 | Temp 97.3°F | Resp 18 | Wt 146.4 lb

## 2016-05-30 DIAGNOSIS — Z9221 Personal history of antineoplastic chemotherapy: Secondary | ICD-10-CM | POA: Diagnosis not present

## 2016-05-30 DIAGNOSIS — Z79811 Long term (current) use of aromatase inhibitors: Secondary | ICD-10-CM | POA: Insufficient documentation

## 2016-05-30 DIAGNOSIS — C50911 Malignant neoplasm of unspecified site of right female breast: Secondary | ICD-10-CM

## 2016-05-30 DIAGNOSIS — Z79899 Other long term (current) drug therapy: Secondary | ICD-10-CM | POA: Diagnosis not present

## 2016-05-30 DIAGNOSIS — C50919 Malignant neoplasm of unspecified site of unspecified female breast: Secondary | ICD-10-CM

## 2016-05-30 DIAGNOSIS — Z923 Personal history of irradiation: Secondary | ICD-10-CM

## 2016-05-30 DIAGNOSIS — C50811 Malignant neoplasm of overlapping sites of right female breast: Secondary | ICD-10-CM

## 2016-05-30 DIAGNOSIS — C7951 Secondary malignant neoplasm of bone: Principal | ICD-10-CM

## 2016-05-30 DIAGNOSIS — R918 Other nonspecific abnormal finding of lung field: Secondary | ICD-10-CM | POA: Diagnosis not present

## 2016-05-30 DIAGNOSIS — Z170 Estrogen receptor positive status [ER+]: Secondary | ICD-10-CM | POA: Insufficient documentation

## 2016-05-30 LAB — COMPREHENSIVE METABOLIC PANEL
ALBUMIN: 4.7 g/dL (ref 3.5–5.0)
ALK PHOS: 61 U/L (ref 38–126)
ALT: 19 U/L (ref 14–54)
AST: 28 U/L (ref 15–41)
Anion gap: 7 (ref 5–15)
BUN: 15 mg/dL (ref 6–20)
CALCIUM: 9 mg/dL (ref 8.9–10.3)
CHLORIDE: 106 mmol/L (ref 101–111)
CO2: 26 mmol/L (ref 22–32)
CREATININE: 0.83 mg/dL (ref 0.44–1.00)
GFR calc Af Amer: >60 mL/min (ref >60)
GFR calc non Af Amer: >60 mL/min (ref >60)
GLUCOSE: 101 mg/dL — AB (ref 65–99)
Potassium: 4 mmol/L (ref 3.5–5.1)
SODIUM: 139 mmol/L (ref 135–145)
Total Bilirubin: 0.7 mg/dL (ref 0.3–1.2)
Total Protein: 7.7 g/dL (ref 6.5–8.1)

## 2016-05-30 MED ORDER — DENOSUMAB 120 MG/1.7ML ~~LOC~~ SOLN
120.0000 mg | Freq: Once | SUBCUTANEOUS | Status: AC
  Administered 2016-05-30: 120 mg via SUBCUTANEOUS
  Filled 2016-05-30: qty 1.7

## 2016-05-30 NOTE — Progress Notes (Signed)
[place] OFFICE PROGRESS NOTE  Patient Care Team: Gerilyn Pilgrim, FNP as PCP - General (Nurse Practitioner)  Cancer of breast [facility])   Staging form: Breast, AJCC 7th Edition     Clinical: Stage IV (T1b, N0, M1) - Signed by Forest Gleason, MD on 05/09/2015    Oncology History   1carcinoma of breast biopsy done from the mass at 9:00 position (right breast) at [facility] on October 20, 2014.   T1 cN0 M1 stage IV disease [id number] biopsies positive for invasive mammary carcinoma with both Dr. lobular features.  Estrogen receptor +94%.  Progesterone receptor +95%.  HER-2/neu receptor -0. 2.  X-rays of the lumbar spine done in now September of 2015 revealed diffuse sclerotic bone lesion. 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  Liver is normal.  There are multiple sub-centimeter porta hepatis and pelvic and inguinal lymphadenopathy 4.  Recent has received 2 weeks of radiation therapy to the right hip at [facility] 5.  Has been started on letrozole 3 weeks ago (November 22, 2014) 6.recent has been switched to [facility] and letrozole 2 weeks ago starting from January 13 (IBRANCE 125 mg by mouth for 3 weeks with one week off ) dose of IBRANCE has been reduced 100 mg because of neutropenia  # June 2017- CT Stable Bone mets;  Bone scan- slight increase in left femur uptake/ rising TM; MRI pelvis STABLE- cont Ibrance [ next? fasolodex +IBRANCE vs Affinitor + examestane]      Cancer of breast ([place])   05/04/2015 Initial Diagnosis Cancer of breast    Breast cancer metastasized to bone ([place])   05/27/2016 Initial Diagnosis Breast cancer metastasized to bone [facility])    INTERVAL HISTORY:  Elizabeth Mccoy 60 y.o.  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  Patient states her left hip pain has resolved. Denies any pain anywhere else. Denies any shortness of breath or chest pain. Denies any nausea vomiting headaches.  Appetite is good. No weight loss. Her tingling or numbness. No fevers or chills. No skin rash. No diarrhea.  REVIEW OF SYSTEMS:  A complete 10 point review of system is done which is negative except mentioned above/history of present illness.   PAST MEDICAL HISTORY :  Past Medical History  Diagnosis Date  . Menopause 12/10/2006    2008  . Breast cancer, right ([place]) 2015    rad tx's on right hip.    PAST SURGICAL HISTORY :  No past surgical history on file.  FAMILY HISTORY :  No family history on file.  SOCIAL HISTORY:   Social History  Substance Use Topics  . Smoking status: Never Smoker   . Smokeless tobacco: Never Used  . Alcohol Use: No    ALLERGIES:  is allergic to beef extract; pork (porcine) protein; and beef-derived products.  MEDICATIONS:  Current Outpatient Prescriptions  Medication Sig Dispense Refill  . Ascorbic Acid (VITAMIN C) 100 MG tablet Take 100 mg by mouth daily.    . calcium-vitamin D (OSCAL WITH D) 500-200 MG-UNIT per tablet Take 1 tablet by mouth 3 (three) times daily.    Marland Kitchen gabapentin (NEURONTIN) 300 MG capsule Take 1 capsule (300 mg total) by mouth 2 (two) times daily. 60 capsule 3  . letrozole (FEMARA) 2.5 MG tablet TAKE ONE  TABLET BY MOUTH EVERY DAY. 30 tablet 0  . levofloxacin (LEVAQUIN) 500 MG tablet Take 1 tablet (500 mg total) by mouth daily. 7 tablet 0  . Multiple Vitamins-Minerals (MULTIVITAMIN WITH MINERALS) tablet Take 1 tablet by mouth daily.    . palbociclib (IBRANCE) 100 MG capsule Take 1 capsule (100 mg total) by mouth daily. Take for 21 days then 7 days off. 21 capsule 11   No current facility-administered medications for this visit.    PHYSICAL EXAMINATION: ECOG PERFORMANCE STATUS: 0 - Asymptomatic  BP 152/74 mmHg  Pulse 67  Temp(Src) 97.3 F (36.3 C)  (Tympanic)  Resp 18  Wt 146 lb 6.2 oz (66.4 kg)  LMP  (Approximate)  Filed Weights   05/30/16 1050  Weight: 146 lb 6.2 oz (66.4 kg)    GENERAL: Well-nourished well-developed; Alert, no distress and comfortable.   Accompanied by her mother and sister. EYES: no pallor or icterus OROPHARYNX: no thrush or ulceration; good dentition  NECK: supple, no masses felt LYMPH:  no palpable lymphadenopathy in the cervical, axillary or inguinal regions LUNGS: clear to auscultation and  No wheeze or crackles HEART/CVS: regular rate & rhythm and no murmurs; No lower extremity edema ABDOMEN:abdomen soft, non-tender and normal bowel sounds Musculoskeletal:no cyanosis of digits and no clubbing  PSYCH: alert & oriented x 3 with fluent speech NEURO: no focal motor/sensory deficits SKIN:  no rashes or significant lesions  LABORATORY DATA:  I have reviewed the data as listed    Component Value Date/Time   NA 139 05/30/2016 1010   NA 139 04/06/2015 1006   K 4.0 05/30/2016 1010   K 4.0 04/06/2015 1006   CL 106 05/30/2016 1010   CL 105 04/06/2015 1006   CO2 26 05/30/2016 1010   CO2 28 04/06/2015 1006   GLUCOSE 101* 05/30/2016 1010   GLUCOSE 90 04/06/2015 1006   BUN 15 05/30/2016 1010   BUN 20 04/06/2015 1006   CREATININE 0.83 05/30/2016 1010   CREATININE 0.78 04/06/2015 1006   CALCIUM 9.0 05/30/2016 1010   CALCIUM 9.5 04/06/2015 1006   PROT 7.7 05/30/2016 1010   PROT 7.8 04/06/2015 1006   ALBUMIN 4.7 05/30/2016 1010   ALBUMIN 4.7 04/06/2015 1006   AST 28 05/30/2016 1010   AST 17 04/06/2015 1006   ALT 19 05/30/2016 1010   ALT 14 04/06/2015 1006   ALKPHOS 61 05/30/2016 1010   ALKPHOS 62 04/06/2015 1006   BILITOT 0.7 05/30/2016 1010   BILITOT 0.6 04/06/2015 1006   GFRNONAA >60 05/30/2016 1010   GFRNONAA >60 04/06/2015 1006   GFRNONAA >60 01/05/2015 1008   GFRAA >60 05/30/2016 1010   GFRAA >60 04/06/2015 1006   GFRAA >60 01/05/2015 1008    No results found for: SPEP, UPEP  Lab  Results  Component Value Date   WBC 1.7* 04/04/2016   NEUTROABS 0.8* 04/04/2016   HGB 11.4* 04/04/2016   HCT 33.0* 04/04/2016   MCV 103.5* 04/04/2016   PLT 178 04/04/2016      Chemistry      Component Value Date/Time   NA 139 05/30/2016 1010   NA 139 04/06/2015 1006   K 4.0 05/30/2016 1010   K 4.0 04/06/2015 1006   CL 106 05/30/2016 1010   CL 105 04/06/2015 1006   CO2 26 05/30/2016 1010   CO2 28 04/06/2015 1006   BUN 15 05/30/2016 1010   BUN 20 04/06/2015 1006   CREATININE 0.83 05/30/2016 1010   CREATININE 0.78 04/06/2015 1006  Component Value Date/Time   CALCIUM 9.0 05/30/2016 1010   CALCIUM 9.5 04/06/2015 1006   ALKPHOS 61 05/30/2016 1010   ALKPHOS 62 04/06/2015 1006   AST 28 05/30/2016 1010   AST 17 04/06/2015 1006   ALT 19 05/30/2016 1010   ALT 14 04/06/2015 1006   BILITOT 0.7 05/30/2016 1010   BILITOT 0.6 04/06/2015 1006       RADIOGRAPHIC STUDIES: I have personally reviewed the radiological images as listed and agreed with the findings in the report. No results found.   ASSESSMENT & PLAN:  Breast cancer metastasized to bone [facility]) Metastatic breast cancer to the bone- most recent imaging June 2017- shows no residual disease; stable bone lesions question slight increase in left femoral uptake. Tumor marker slightly up.  Patient states her left femur pain has significant improved//resolved. Clinically asymptomatic at this time.  I discussed with the patient that the next treatment would be- Faslodex plus ibrance versus Afinitor plus exemestane. Discussed that both options are good; however I would change/swish therapies that the patient gets symptomatic or the tumor markers significantly rising. She understands.  # Multiple bone lesions- continuous Geva tolerating well.  # I reviewed the blood work- with the patient in detail; also reviewed the imaging independently [as summarized above]; and with the patient in detail.   # Patient follow-up with me in  approximately 4 weeks/labs.       Orders Placed This Encounter  Procedures  . Comprehensive metabolic panel    Standing Status: Future     Number of Occurrences:      Standing Expiration Date: 05/30/2017    Order Specific Question:  Has the patient fasted?    Answer:  No  . CBC with Differential    Standing Status: Future     Number of Occurrences:      Standing Expiration Date: 05/30/2017  . Cancer antigen 27.29    Standing Status: Future     Number of Occurrences:      Standing Expiration Date: 05/30/2017   All questions were answered. The patient knows to call the clinic with any problems, questions or concerns.      Cammie Sickle, MD 05/30/2016 6:22 PM

## 2016-05-30 NOTE — Assessment & Plan Note (Signed)
Metastatic breast cancer to the bone- most recent imaging June 2017- shows no residual disease; stable bone lesions question slight increase in left femoral uptake. Tumor marker slightly up.  Patient states her left femur pain has significant improved//resolved. Clinically asymptomatic at this time.  I discussed with the patient that the next treatment would be- Faslodex plus ibrance versus Afinitor plus exemestane. Discussed that both options are good; however I would change/swish therapies that the patient gets symptomatic or the tumor markers significantly rising. She understands.  # Multiple bone lesions- continuous Geva tolerating well.  # I reviewed the blood work- with the patient in detail; also reviewed the imaging independently [as summarized above]; and with the patient in detail.   # Patient follow-up with me in approximately 4 weeks/labs.

## 2016-05-31 LAB — CA 27.29 (SERIAL MONITOR): CA 27.29: 167.9 U/mL — AB (ref 0.0–38.6)

## 2016-06-01 ENCOUNTER — Telehealth: Payer: Self-pay | Admitting: *Deleted

## 2016-06-01 NOTE — Telephone Encounter (Signed)
Patient returned call and agrees to appt 7/7

## 2016-06-01 NOTE — Telephone Encounter (Signed)
Called asking for tumor marker results and when given, she asked if she needs to be seen before appt on 7/19 to discuss changing her meds. Per Dr Rogue Bussing, see her in 2 weeks. I have returned call to her, but had to leave a message for her to return my call

## 2016-06-15 ENCOUNTER — Inpatient Hospital Stay: Payer: BLUE CROSS/BLUE SHIELD | Attending: Internal Medicine | Admitting: Internal Medicine

## 2016-06-15 DIAGNOSIS — Z7951 Long term (current) use of inhaled steroids: Secondary | ICD-10-CM | POA: Diagnosis not present

## 2016-06-15 DIAGNOSIS — C7951 Secondary malignant neoplasm of bone: Secondary | ICD-10-CM | POA: Insufficient documentation

## 2016-06-15 DIAGNOSIS — D709 Neutropenia, unspecified: Secondary | ICD-10-CM | POA: Diagnosis not present

## 2016-06-15 DIAGNOSIS — Z923 Personal history of irradiation: Secondary | ICD-10-CM | POA: Diagnosis not present

## 2016-06-15 DIAGNOSIS — C50811 Malignant neoplasm of overlapping sites of right female breast: Secondary | ICD-10-CM | POA: Diagnosis not present

## 2016-06-15 DIAGNOSIS — R978 Other abnormal tumor markers: Secondary | ICD-10-CM | POA: Insufficient documentation

## 2016-06-15 DIAGNOSIS — Z170 Estrogen receptor positive status [ER+]: Secondary | ICD-10-CM | POA: Diagnosis not present

## 2016-06-15 DIAGNOSIS — Z79899 Other long term (current) drug therapy: Secondary | ICD-10-CM | POA: Diagnosis not present

## 2016-06-15 DIAGNOSIS — Z79818 Long term (current) use of other agents affecting estrogen receptors and estrogen levels: Secondary | ICD-10-CM | POA: Insufficient documentation

## 2016-06-15 DIAGNOSIS — Z79811 Long term (current) use of aromatase inhibitors: Secondary | ICD-10-CM | POA: Diagnosis not present

## 2016-06-15 NOTE — Progress Notes (Signed)
[place] OFFICE PROGRESS NOTE  Patient Care Team: Gerilyn Pilgrim, FNP as PCP - General (Nurse Practitioner)  Cancer of breast [facility])   Staging form: Breast, AJCC 7th Edition     Clinical: Stage IV (T1b, N0, M1) - Signed by Forest Gleason, MD on 05/09/2015    Oncology History   1carcinoma of breast biopsy done from the mass at 9:00 position (right breast) at [facility] on October 20, 2014.   T1 cN0 M1 stage IV disease [id number] biopsies positive for invasive mammary carcinoma with both Dr. lobular features.  Estrogen receptor +94%.  Progesterone receptor +95%.  HER-2/neu receptor -0. 2.  X-rays of the lumbar spine done in now September of 2015 revealed diffuse sclerotic bone lesion. 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  Liver is normal.  There are multiple sub-centimeter porta hepatis and pelvic and inguinal lymphadenopathy 4.  Recent has received 2 weeks of radiation therapy to the right hip at [facility] 5.  Has been started on letrozole 3 weeks ago (November 22, 2014) 6.recent has been switched to [facility] and letrozole 2 weeks ago starting from January 2016- (IBRANCE 125 mg by mouth for 3 weeks with one week off ) dose of IBRANCE has been reduced 100 mg because of neutropenia  # June 2017- CT Stable Bone mets;  Bone scan- slight increase in left femur uptake/ rising TM; MRI pelvis STABLE- cont Ibrance [ next? fasolodex +IBRANCE vs Affinitor + examestane]      Malignant neoplasm of overlapping sites of right breast ([place])   06/15/2016 Initial Diagnosis Malignant neoplasm of overlapping sites of right breast ([place])    INTERVAL HISTORY:  Elizabeth Mccoy 60 y.o.  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 Denies any  pain anywhere else. Denies any shortness of breath or chest pain. Denies any nausea vomiting headaches.  Appetite is good. No weight loss. Her tingling or numbness. No fevers or chills. No skin rash. No diarrhea. Patient is tolerating ibrance very well.  REVIEW OF SYSTEMS:  A complete 10 point review of system is done which is negative except mentioned above/history of present illness.   PAST MEDICAL HISTORY :  Past Medical History  Diagnosis Date  . Menopause 12/10/2006    2008  . Breast cancer, right ([place]) 2015    rad tx's on right hip.    PAST SURGICAL HISTORY :  No past surgical history on file.  FAMILY HISTORY :  No family history on file.  SOCIAL HISTORY:   Social History  Substance Use Topics  . Smoking status: Never Smoker   . Smokeless tobacco: Never Used  . Alcohol Use: No    ALLERGIES:  is allergic to beef extract; pork (porcine) protein; and beef-derived products.  MEDICATIONS:  Current Outpatient Prescriptions  Medication Sig Dispense Refill  . Ascorbic Acid (VITAMIN C) 100 MG tablet Take 100 mg by mouth daily.    . calcium-vitamin D (OSCAL WITH D) 500-200 MG-UNIT per tablet Take 1 tablet by mouth 3 (three) times daily.    Marland Kitchen gabapentin (NEURONTIN) 300 MG capsule Take 1 capsule (300 mg total) by mouth 2 (two) times daily. 60 capsule 3  . letrozole (FEMARA) 2.5 MG tablet TAKE ONE TABLET BY MOUTH EVERY DAY. [place]  tablet 0  . levofloxacin (LEVAQUIN) 500 MG tablet Take 1 tablet (500 mg total) by mouth daily. 7 tablet 0  . Multiple Vitamins-Minerals (MULTIVITAMIN WITH MINERALS) tablet Take 1 tablet by mouth daily.    . palbociclib (IBRANCE) 100 MG capsule Take 1 capsule (100 mg total) by mouth daily. Take for 21 days then 7 days off. 21 capsule 11   No current facility-administered medications for this visit.    PHYSICAL EXAMINATION: ECOG PERFORMANCE STATUS: 0 - Asymptomatic  BP 148/81 mmHg  Pulse 85  Temp(Src) 98.3 F (36.8 C)  Resp 18  Wt 147 lb 4.3 oz (66.8 kg)   LMP  (Approximate)  Filed Weights   06/15/16 1500  Weight: 147 lb 4.3 oz (66.8 kg)    GENERAL: Well-nourished well-developed; Alert, no distress and comfortable.   Accompanied by her mother and sister. EYES: no pallor or icterus OROPHARYNX: no thrush or ulceration; good dentition  NECK: supple, no masses felt LYMPH:  no palpable lymphadenopathy in the cervical, axillary or inguinal regions LUNGS: clear to auscultation and  No wheeze or crackles HEART/CVS: regular rate & rhythm and no murmurs; No lower extremity edema ABDOMEN:abdomen soft, non-tender and normal bowel sounds Musculoskeletal:no cyanosis of digits and no clubbing  PSYCH: alert & oriented x 3 with fluent speech NEURO: no focal motor/sensory deficits SKIN:  no rashes or significant lesions  LABORATORY DATA:  I have reviewed the data as listed    Component Value Date/Time   NA 139 05/30/2016 1010   NA 139 04/06/2015 1006   K 4.0 05/30/2016 1010   K 4.0 04/06/2015 1006   CL 106 05/30/2016 1010   CL 105 04/06/2015 1006   CO2 26 05/30/2016 1010   CO2 28 04/06/2015 1006   GLUCOSE 101* 05/30/2016 1010   GLUCOSE 90 04/06/2015 1006   BUN 15 05/30/2016 1010   BUN 20 04/06/2015 1006   CREATININE 0.83 05/30/2016 1010   CREATININE 0.78 04/06/2015 1006   CALCIUM 9.0 05/30/2016 1010   CALCIUM 9.5 04/06/2015 1006   PROT 7.7 05/30/2016 1010   PROT 7.8 04/06/2015 1006   ALBUMIN 4.7 05/30/2016 1010   ALBUMIN 4.7 04/06/2015 1006   AST 28 05/30/2016 1010   AST 17 04/06/2015 1006   ALT 19 05/30/2016 1010   ALT 14 04/06/2015 1006   ALKPHOS 61 05/30/2016 1010   ALKPHOS 62 04/06/2015 1006   BILITOT 0.7 05/30/2016 1010   BILITOT 0.6 04/06/2015 1006   GFRNONAA >60 05/30/2016 1010   GFRNONAA >60 04/06/2015 1006   GFRNONAA >60 01/05/2015 1008   GFRAA >60 05/30/2016 1010   GFRAA >60 04/06/2015 1006   GFRAA >60 01/05/2015 1008    No results found for: SPEP, UPEP  Lab Results  Component Value Date   WBC 1.7* 04/04/2016    NEUTROABS 0.8* 04/04/2016   HGB 11.4* 04/04/2016   HCT 33.0* 04/04/2016   MCV 103.5* 04/04/2016   PLT 178 04/04/2016      Chemistry      Component Value Date/Time   NA 139 05/30/2016 1010   NA 139 04/06/2015 1006   K 4.0 05/30/2016 1010   K 4.0 04/06/2015 1006   CL 106 05/30/2016 1010   CL 105 04/06/2015 1006   CO2 26 05/30/2016 1010   CO2 28 04/06/2015 1006   BUN 15 05/30/2016 1010   BUN 20 04/06/2015 1006   CREATININE 0.83 05/30/2016 1010   CREATININE 0.78 04/06/2015 1006      Component Value Date/Time   CALCIUM  9.0 05/30/2016 1010   CALCIUM 9.5 04/06/2015 1006   ALKPHOS 61 05/30/2016 1010   ALKPHOS 62 04/06/2015 1006   AST 28 05/30/2016 1010   AST 17 04/06/2015 1006   ALT 19 05/30/2016 1010   ALT 14 04/06/2015 1006   BILITOT 0.7 05/30/2016 1010   BILITOT 0.6 04/06/2015 1006       RADIOGRAPHIC STUDIES: I have personally reviewed the radiological images as listed and agreed with the findings in the report. No results found.   ASSESSMENT & PLAN:  Malignant neoplasm of overlapping sites of right breast [facility]) Metastatic breast cancer to the bone- most recent imaging June 2017- shows no residual disease; stable bone lesions question slight increase in left femoral uptake; Marland Kitchen Tumor marker slightly up.   # Patient currently not experiencing any pain. Review the tumor markers with the patient and family in detail. Discussed that the next step would be Faslodex plus ibrance.   # Check labs on the 12th; follow-up with me on the 19th of July for possible Faslodex- if the tumor marker still rising  # Multiple bone lesions- on X-Geva tolerating well. Discussed the potential side effects of osteonecrosis of the jaw. Also discussed the data at that [facility] could space out every 12 weeks. However we continue X-geva at the current dose.     No orders of the defined types were placed in this encounter.   All questions were answered. The patient knows to call the clinic with  any problems, questions or concerns.      Cammie Sickle, MD 06/15/2016 3:33 PM

## 2016-06-15 NOTE — Assessment & Plan Note (Signed)
Metastatic breast cancer to the bone- most recent imaging June 2017- shows no residual disease; stable bone lesions question slight increase in left femoral uptake; Marland Kitchen Tumor marker slightly up.   # Patient currently not experiencing any pain. Review the tumor markers with the patient and family in detail. Discussed that the next step would be Faslodex plus ibrance.   # Check labs on the 12th; follow-up with me on the 19th of July for possible Faslodex- if the tumor marker still rising  # Multiple bone lesions- on X-Geva tolerating well. Discussed the potential side effects of osteonecrosis of the jaw. Also discussed the data at that [facility] could space out every 12 weeks. However we continue X-geva at the current dose.

## 2016-06-15 NOTE — Progress Notes (Signed)
Patient here today to discuss new treatment plan.

## 2016-06-20 ENCOUNTER — Inpatient Hospital Stay: Payer: BLUE CROSS/BLUE SHIELD

## 2016-06-20 DIAGNOSIS — C50911 Malignant neoplasm of unspecified site of right female breast: Secondary | ICD-10-CM

## 2016-06-20 DIAGNOSIS — C7951 Secondary malignant neoplasm of bone: Principal | ICD-10-CM

## 2016-06-20 DIAGNOSIS — C50811 Malignant neoplasm of overlapping sites of right female breast: Secondary | ICD-10-CM | POA: Diagnosis not present

## 2016-06-20 LAB — CBC WITH DIFFERENTIAL/PLATELET
BASOS PCT: 2 %
Basophils Absolute: 0 10*3/uL (ref 0–0.1)
EOS ABS: 0 10*3/uL (ref 0–0.7)
Eosinophils Relative: 2 %
HCT: 31.8 % — ABNORMAL LOW (ref 35.0–47.0)
Hemoglobin: 11.1 g/dL — ABNORMAL LOW (ref 12.0–16.0)
LYMPHS ABS: 0.5 10*3/uL — AB (ref 1.0–3.6)
LYMPHS PCT: 31 %
MCH: 36 pg — ABNORMAL HIGH (ref 26.0–34.0)
MCHC: 35 g/dL (ref 32.0–36.0)
MCV: 103.1 fL — ABNORMAL HIGH (ref 80.0–100.0)
MONO ABS: 0.1 10*3/uL — AB (ref 0.2–0.9)
MONOS PCT: 6 %
NEUTROS ABS: 0.9 10*3/uL — AB (ref 1.4–6.5)
NEUTROS PCT: 59 %
PLATELETS: 251 10*3/uL (ref 150–440)
RBC: 3.09 MIL/uL — ABNORMAL LOW (ref 3.80–5.20)
RDW: 15.5 % — AB (ref 11.5–14.5)
WBC: 1.5 10*3/uL — AB (ref 3.6–11.0)

## 2016-06-20 LAB — COMPREHENSIVE METABOLIC PANEL WITH GFR
ALT: 22 U/L (ref 14–54)
AST: 28 U/L (ref 15–41)
Albumin: 4.3 g/dL (ref 3.5–5.0)
Alkaline Phosphatase: 67 U/L (ref 38–126)
Anion gap: 5 (ref 5–15)
BUN: 14 mg/dL (ref 6–20)
CO2: 28 mmol/L (ref 22–32)
Calcium: 8.9 mg/dL (ref 8.9–10.3)
Chloride: 105 mmol/L (ref 101–111)
Creatinine, Ser: 0.69 mg/dL (ref 0.44–1.00)
GFR calc Af Amer: >60 mL/min
GFR calc non Af Amer: >60 mL/min
Glucose, Bld: 102 mg/dL — ABNORMAL HIGH (ref 65–99)
Potassium: 3.9 mmol/L (ref 3.5–5.1)
Sodium: 138 mmol/L (ref 135–145)
Total Bilirubin: 0.7 mg/dL (ref 0.3–1.2)
Total Protein: 7.5 g/dL (ref 6.5–8.1)

## 2016-06-21 ENCOUNTER — Encounter: Payer: Self-pay | Admitting: *Deleted

## 2016-06-21 LAB — CANCER ANTIGEN 27.29: CA 27.29: 210.7 U/mL — AB (ref 0.0–38.6)

## 2016-06-21 NOTE — Progress Notes (Signed)
  Oncology Nurse Navigator Documentation  Navigator Location: [facility] (06/21/16 0900) Navigator Encounter Type: Telephone (06/21/16 0900) Telephone: Incoming Call (06/21/16 0900)           Treatment Phase: Active Tx (06/21/16 0900)     Interventions: Coordination of Care (06/21/16 0900)   Coordination of Care: Other (06/21/16 0900)                  Time Spent with Patient: 30 (06/21/16 0900)   Patient called and wanted to know the results of her tumor markers.  They are rising at 210.7.  She wants to know if she can get her new injection at her appointment next week.  Talked to Dr. Yevette Edwards and Renita Papa, RN.  Patient is scheduled to return next week with the plan to initiate Faslodex.  Will try and get pre-authorization prior to her appointment.  Patient was informed of the plan.

## 2016-06-22 ENCOUNTER — Other Ambulatory Visit: Payer: Self-pay | Admitting: Internal Medicine

## 2016-06-27 ENCOUNTER — Ambulatory Visit: Payer: BLUE CROSS/BLUE SHIELD

## 2016-06-27 ENCOUNTER — Inpatient Hospital Stay: Payer: BLUE CROSS/BLUE SHIELD

## 2016-06-27 ENCOUNTER — Ambulatory Visit: Payer: BLUE CROSS/BLUE SHIELD | Admitting: Internal Medicine

## 2016-06-27 ENCOUNTER — Inpatient Hospital Stay (HOSPITAL_BASED_OUTPATIENT_CLINIC_OR_DEPARTMENT_OTHER): Payer: BLUE CROSS/BLUE SHIELD | Admitting: Internal Medicine

## 2016-06-27 ENCOUNTER — Other Ambulatory Visit: Payer: BLUE CROSS/BLUE SHIELD

## 2016-06-27 ENCOUNTER — Encounter: Payer: Self-pay | Admitting: *Deleted

## 2016-06-27 VITALS — BP 151/75 | HR 76 | Temp 98.4°F | Resp 18 | Wt 145.5 lb

## 2016-06-27 DIAGNOSIS — C7951 Secondary malignant neoplasm of bone: Secondary | ICD-10-CM

## 2016-06-27 DIAGNOSIS — Z79811 Long term (current) use of aromatase inhibitors: Secondary | ICD-10-CM | POA: Diagnosis not present

## 2016-06-27 DIAGNOSIS — R978 Other abnormal tumor markers: Secondary | ICD-10-CM

## 2016-06-27 DIAGNOSIS — Z79899 Other long term (current) drug therapy: Secondary | ICD-10-CM

## 2016-06-27 DIAGNOSIS — Z170 Estrogen receptor positive status [ER+]: Secondary | ICD-10-CM | POA: Diagnosis not present

## 2016-06-27 DIAGNOSIS — C799 Secondary malignant neoplasm of unspecified site: Secondary | ICD-10-CM | POA: Insufficient documentation

## 2016-06-27 DIAGNOSIS — Z923 Personal history of irradiation: Secondary | ICD-10-CM

## 2016-06-27 DIAGNOSIS — Z79818 Long term (current) use of other agents affecting estrogen receptors and estrogen levels: Secondary | ICD-10-CM

## 2016-06-27 DIAGNOSIS — C50811 Malignant neoplasm of overlapping sites of right female breast: Secondary | ICD-10-CM | POA: Diagnosis not present

## 2016-06-27 DIAGNOSIS — C50919 Malignant neoplasm of unspecified site of unspecified female breast: Secondary | ICD-10-CM

## 2016-06-27 MED ORDER — FULVESTRANT 250 MG/5ML IM SOLN
500.0000 mg | INTRAMUSCULAR | Status: DC
  Administered 2016-06-27: 500 mg via INTRAMUSCULAR

## 2016-06-27 MED ORDER — DENOSUMAB 120 MG/1.7ML ~~LOC~~ SOLN
120.0000 mg | Freq: Once | SUBCUTANEOUS | Status: AC
  Administered 2016-06-27: 120 mg via SUBCUTANEOUS

## 2016-06-27 NOTE — Assessment & Plan Note (Addendum)
Metastatic breast cancer to the bone- most recent imaging June 2017- shows no residual disease; stable bone lesions question slight increase in left femoral uptake; Marland Kitchen Tumor marker trending up/- 200+   # Start faslodex loading followed by faslodex monthly with ibrance.   # Multiple bone lesions- on X-Geva tolerating well.   # follow up with me in 4 weeks x-geva/ labs- [id number]/ faslodex.

## 2016-06-27 NOTE — Progress Notes (Signed)
  Oncology Nurse Navigator Documentation  Navigator Location: [facility] (06/27/16 1700) Navigator Encounter Type: Telephone (06/27/16 1700) Telephone: Incoming Call (06/27/16 1700)           Treatment Phase: Active Tx (06/27/16 1700)     Interventions: Coordination of Care (06/27/16 1700)                      Time Spent with Patient: 30 (06/27/16 1700)   Patient called and wanted to know if she is supposed to continue the Letrozole.  Discussed with Dr. Rogue Bussing.  Called and informed patient is to stop the Letrozole.  She is to call if she has any questions or needs.

## 2016-06-27 NOTE — Progress Notes (Signed)
[name] OFFICE PROGRESS NOTE  Patient Care Team: Gerilyn Pilgrim, FNP as PCP - General (Nurse Practitioner)  Cancer of breast [facility])   Staging form: Breast, AJCC 7th Edition     Clinical: Stage IV (T1b, N0, M1) - Signed by Forest Gleason, MD on 05/09/2015    Oncology History   1carcinoma of breast biopsy done from the mass at 9:00 position (right breast) at [facility] on October 20, 2014.   T1 cN0 M1 stage IV disease [id number] biopsies positive for invasive mammary carcinoma with both Dr. lobular features.  Estrogen receptor +94%.  Progesterone receptor +95%.  HER-2/neu receptor -0. 2.  X-rays of the lumbar spine done in now September of 2015 revealed diffuse sclerotic bone lesion. 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  Liver is normal.  There are multiple sub-centimeter porta hepatis and pelvic and inguinal lymphadenopathy 4.  Recent has received 2 weeks of radiation therapy to the right hip at [facility] 5.  Has been started on letrozole 3 weeks ago (November 22, 2014) 6.recent has been switched to [facility] and letrozole 2 weeks ago starting from January 2016- (IBRANCE 125 mg by mouth for 3 weeks with one week off ) dose of IBRANCE has been reduced 100 mg because of neutropenia  # June 2017- CT Stable Bone mets;  Bone scan- slight increase in left femur uptake/ rising TM; MRI pelvis STABLE- cont Ibrance  # Increasing TM; July 19th 2017- START fasolodex + cont IBRANCE       Malignant neoplasm of overlapping sites of right breast ([place])   06/15/2016 Initial Diagnosis Malignant neoplasm of overlapping sites of right breast [facility])    INTERVAL HISTORY:  Elizabeth Mccoy 60 y.o.  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   She denies any worsening pain in the left  hip. Denies any shortness of breath or chest pain. Denies any nausea vomiting headaches.  Appetite is good. No weight loss. Her tingling or numbness. No fevers or chills. No skin rash. No diarrhea. Patient is tolerating ibrance very well. No headaches or vision changes.  REVIEW OF SYSTEMS:  A complete 10 point review of system is done which is negative except mentioned above/history of present illness.   PAST MEDICAL HISTORY :  Past Medical History  Diagnosis Date  . Menopause 12/10/2006    2008  . Breast cancer, right ([place]) 2015    rad tx's on right hip.    PAST SURGICAL HISTORY :  No past surgical history on file.  FAMILY HISTORY :  No family history on file.  SOCIAL HISTORY:   Social History  Substance Use Topics  . Smoking status: Never Smoker   . Smokeless tobacco: Never Used  . Alcohol Use: No    ALLERGIES:  is allergic to beef extract; pork (porcine) protein; and beef-derived products.  MEDICATIONS:  Current Outpatient Prescriptions  Medication Sig Dispense Refill  . Ascorbic Acid (VITAMIN C) 100 MG tablet Take 100 mg by mouth daily.    . calcium-vitamin D (OSCAL WITH D) 500-200 MG-UNIT per tablet Take 1 tablet by mouth 3 (three) times daily.    Marland Kitchen gabapentin (NEURONTIN) 300 MG capsule Take 1 capsule (300 mg total) by mouth 2 (two) times daily. 60 capsule 3  . letrozole (FEMARA) 2.5 MG tablet TAKE  ONE TABLET BY MOUTH EVERY DAY. 30 tablet 0  . levofloxacin (LEVAQUIN) 500 MG tablet Take 1 tablet (500 mg total) by mouth daily. 7 tablet 0  . Multiple Vitamins-Minerals (MULTIVITAMIN WITH MINERALS) tablet Take 1 tablet by mouth daily.    . palbociclib (IBRANCE) 100 MG capsule Take 1 capsule (100 mg total) by mouth daily. Take for 21 days then 7 days off. 21 capsule 11   No current facility-administered medications for this visit.    PHYSICAL EXAMINATION: ECOG PERFORMANCE STATUS: 0 - Asymptomatic  BP 151/75 mmHg  Pulse 76  Temp(Src) 98.4 F (36.9 C) (Tympanic)  Resp 18   Wt 145 lb 8 oz (65.998 kg)  LMP  (Approximate)  Filed Weights   06/27/16 1118  Weight: 145 lb 8 oz (65.998 kg)    GENERAL: Well-nourished well-developed; Alert, no distress and comfortable.   Accompanied by her mother and sister. EYES: no pallor or icterus OROPHARYNX: no thrush or ulceration; good dentition  NECK: supple, no masses felt LYMPH:  no palpable lymphadenopathy in the cervical, axillary or inguinal regions LUNGS: clear to auscultation and  No wheeze or crackles HEART/CVS: regular rate & rhythm and no murmurs; No lower extremity edema ABDOMEN:abdomen soft, non-tender and normal bowel sounds Musculoskeletal:no cyanosis of digits and no clubbing  PSYCH: alert & oriented x 3 with fluent speech NEURO: no focal motor/sensory deficits SKIN:  no rashes or significant lesions  LABORATORY DATA:  I have reviewed the data as listed    Component Value Date/Time   NA 138 06/20/2016 1043   NA 139 04/06/2015 1006   K 3.9 06/20/2016 1043   K 4.0 04/06/2015 1006   CL 105 06/20/2016 1043   CL 105 04/06/2015 1006   CO2 28 06/20/2016 1043   CO2 28 04/06/2015 1006   GLUCOSE 102* 06/20/2016 1043   GLUCOSE 90 04/06/2015 1006   BUN 14 06/20/2016 1043   BUN 20 04/06/2015 1006   CREATININE 0.69 06/20/2016 1043   CREATININE 0.78 04/06/2015 1006   CALCIUM 8.9 06/20/2016 1043   CALCIUM 9.5 04/06/2015 1006   PROT 7.5 06/20/2016 1043   PROT 7.8 04/06/2015 1006   ALBUMIN 4.3 06/20/2016 1043   ALBUMIN 4.7 04/06/2015 1006   AST 28 06/20/2016 1043   AST 17 04/06/2015 1006   ALT 22 06/20/2016 1043   ALT 14 04/06/2015 1006   ALKPHOS 67 06/20/2016 1043   ALKPHOS 62 04/06/2015 1006   BILITOT 0.7 06/20/2016 1043   BILITOT 0.6 04/06/2015 1006   GFRNONAA >60 06/20/2016 1043   GFRNONAA >60 04/06/2015 1006   GFRNONAA >60 01/05/2015 1008   GFRAA >60 06/20/2016 1043   GFRAA >60 04/06/2015 1006   GFRAA >60 01/05/2015 1008    No results found for: SPEP, UPEP  Lab Results  Component Value  Date   WBC 1.5* 06/20/2016   NEUTROABS 0.9* 06/20/2016   HGB 11.1* 06/20/2016   HCT 31.8* 06/20/2016   MCV 103.1* 06/20/2016   PLT 251 06/20/2016      Chemistry      Component Value Date/Time   NA 138 06/20/2016 1043   NA 139 04/06/2015 1006   K 3.9 06/20/2016 1043   K 4.0 04/06/2015 1006   CL 105 06/20/2016 1043   CL 105 04/06/2015 1006   CO2 28 06/20/2016 1043   CO2 28 04/06/2015 1006   BUN 14 06/20/2016 1043   BUN 20 04/06/2015 1006   CREATININE 0.69 06/20/2016 1043   CREATININE 0.78 04/06/2015 1006  Component Value Date/Time   CALCIUM 8.9 06/20/2016 1043   CALCIUM 9.5 04/06/2015 1006   ALKPHOS 67 06/20/2016 1043   ALKPHOS 62 04/06/2015 1006   AST 28 06/20/2016 1043   AST 17 04/06/2015 1006   ALT 22 06/20/2016 1043   ALT 14 04/06/2015 1006   BILITOT 0.7 06/20/2016 1043   BILITOT 0.6 04/06/2015 1006       RADIOGRAPHIC STUDIES: I have personally reviewed the radiological images as listed and agreed with the findings in the report. No results found.   ASSESSMENT & PLAN:  Malignant neoplasm of overlapping sites of right breast [facility]) Metastatic breast cancer to the bone- most recent imaging June 2017- shows no residual disease; stable bone lesions question slight increase in left femoral uptake; Marland Kitchen Tumor marker trending up/- 200+   # Start faslodex loading followed by faslodex monthly with ibrance.   # Multiple bone lesions- on X-Geva tolerating well.   # follow up with me in 4 weeks x-geva/ labs- ca27-27/ faslodex.     Orders Placed This Encounter  Procedures  . CBC with Differential    Standing Status: Future     Number of Occurrences:      Standing Expiration Date: 06/27/2017  . CBC with Differential    Standing Status: Future     Number of Occurrences:      Standing Expiration Date: 06/27/2017  . Comprehensive metabolic panel    Standing Status: Future     Number of Occurrences:      Standing Expiration Date: 06/27/2017    Order Specific  Question:  Has the patient fasted?    Answer:  No  . Cancer antigen 27.29    Standing Status: Future     Number of Occurrences:      Standing Expiration Date: 06/27/2017   All questions were answered. The patient knows to call the clinic with any problems, questions or concerns.      Cammie Sickle, MD 06/27/2016 10:38 PM

## 2016-07-11 ENCOUNTER — Inpatient Hospital Stay: Payer: BLUE CROSS/BLUE SHIELD

## 2016-07-12 ENCOUNTER — Telehealth: Payer: Self-pay | Admitting: *Deleted

## 2016-07-12 NOTE — Telephone Encounter (Signed)
  Oncology Nurse Navigator Documentation  Navigator Location: [facility] Onc (07/12/16 0900) Navigator Encounter Type: Telephone (07/12/16 0900) Telephone: Incoming Call (07/12/16 0900)           Treatment Phase: Active Tx (07/12/16 0900)     Interventions: Coordination of Care (07/12/16 0900)   Coordination of Care: Appts (07/12/16 0900)                  Time Spent with Patient: 45 (07/12/16 0900)   Patient's family called yesterday to cancel her appointment since she was sick, and wanted to reschedule her appointment.  Notified Nada Boozer, RN.  Called patient today with new appointment for Friday at 10:00 for labs and injection.

## 2016-07-13 ENCOUNTER — Inpatient Hospital Stay: Payer: BLUE CROSS/BLUE SHIELD | Attending: Internal Medicine

## 2016-07-13 ENCOUNTER — Inpatient Hospital Stay: Payer: BLUE CROSS/BLUE SHIELD

## 2016-07-13 DIAGNOSIS — R978 Other abnormal tumor markers: Secondary | ICD-10-CM | POA: Diagnosis not present

## 2016-07-13 DIAGNOSIS — Z170 Estrogen receptor positive status [ER+]: Secondary | ICD-10-CM | POA: Insufficient documentation

## 2016-07-13 DIAGNOSIS — D709 Neutropenia, unspecified: Secondary | ICD-10-CM | POA: Insufficient documentation

## 2016-07-13 DIAGNOSIS — Z79899 Other long term (current) drug therapy: Secondary | ICD-10-CM | POA: Insufficient documentation

## 2016-07-13 DIAGNOSIS — C7951 Secondary malignant neoplasm of bone: Secondary | ICD-10-CM | POA: Diagnosis not present

## 2016-07-13 DIAGNOSIS — C50811 Malignant neoplasm of overlapping sites of right female breast: Secondary | ICD-10-CM | POA: Diagnosis present

## 2016-07-13 DIAGNOSIS — C50919 Malignant neoplasm of unspecified site of unspecified female breast: Secondary | ICD-10-CM

## 2016-07-13 LAB — CBC WITH DIFFERENTIAL/PLATELET
BASOS ABS: 0 10*3/uL (ref 0–0.1)
BASOS PCT: 2 %
EOS ABS: 0 10*3/uL (ref 0–0.7)
Eosinophils Relative: 2 %
HCT: 31.2 % — ABNORMAL LOW (ref 35.0–47.0)
Hemoglobin: 10.9 g/dL — ABNORMAL LOW (ref 12.0–16.0)
Lymphocytes Relative: 34 %
Lymphs Abs: 0.7 10*3/uL — ABNORMAL LOW (ref 1.0–3.6)
MCH: 36 pg — ABNORMAL HIGH (ref 26.0–34.0)
MCHC: 35 g/dL (ref 32.0–36.0)
MCV: 102.8 fL — ABNORMAL HIGH (ref 80.0–100.0)
MONO ABS: 0.2 10*3/uL (ref 0.2–0.9)
MONOS PCT: 7 %
NEUTROS PCT: 55 %
Neutro Abs: 1.1 10*3/uL — ABNORMAL LOW (ref 1.4–6.5)
Platelets: 245 10*3/uL (ref 150–440)
RBC: 3.04 MIL/uL — ABNORMAL LOW (ref 3.80–5.20)
RDW: 15.8 % — AB (ref 11.5–14.5)
WBC: 2.1 10*3/uL — ABNORMAL LOW (ref 3.6–11.0)

## 2016-07-13 MED ORDER — FULVESTRANT 250 MG/5ML IM SOLN
500.0000 mg | INTRAMUSCULAR | Status: DC
  Administered 2016-07-13: 500 mg via INTRAMUSCULAR
  Filled 2016-07-13: qty 10

## 2016-07-13 MED ORDER — SODIUM CHLORIDE 0.9 % IV SOLN: Freq: Once | INTRAVENOUS | Status: DC | PRN

## 2016-07-13 MED ORDER — ALBUTEROL SULFATE (2.5 MG/3ML) 0.083% IN NEBU: 2.5000 mg | INHALATION_SOLUTION | Freq: Once | RESPIRATORY_TRACT | Status: DC | PRN

## 2016-07-13 MED ORDER — METHYLPREDNISOLONE SODIUM SUCC 125 MG IJ SOLR: 125.0000 mg | Freq: Once | INTRAMUSCULAR | Status: DC | PRN

## 2016-07-13 MED ORDER — DIPHENHYDRAMINE HCL 50 MG/ML IJ SOLN: 25.0000 mg | Freq: Once | INTRAMUSCULAR | Status: DC | PRN

## 2016-07-16 ENCOUNTER — Telehealth: Payer: Self-pay | Admitting: *Deleted

## 2016-07-16 ENCOUNTER — Telehealth: Payer: Self-pay

## 2016-07-16 NOTE — Progress Notes (Signed)
Called patient to inform her WBC is still slightly low. Patient should continue Ibrance and faslodex.  Verbalized understanding.

## 2016-07-16 NOTE — Telephone Encounter (Signed)
Called pt per Dr. B informed pt- that wbc is slightly low; but to continue Ibrance as recommended; along with faslodex.  Pt verbalized an understanding and had no further questions.

## 2016-07-16 NOTE — Telephone Encounter (Signed)
-----   Message from Cammie Sickle, MD sent at 07/15/2016 12:50 PM EDT ----- Please inform pt- that wbc is slightly low; but to continue Ibrance as recommended; along with faslodex.

## 2016-07-25 ENCOUNTER — Inpatient Hospital Stay: Payer: BLUE CROSS/BLUE SHIELD

## 2016-07-25 ENCOUNTER — Inpatient Hospital Stay (HOSPITAL_BASED_OUTPATIENT_CLINIC_OR_DEPARTMENT_OTHER): Payer: BLUE CROSS/BLUE SHIELD | Admitting: Internal Medicine

## 2016-07-25 VITALS — BP 120/74 | HR 62 | Temp 98.1°F | Resp 18 | Wt 143.0 lb

## 2016-07-25 DIAGNOSIS — R978 Other abnormal tumor markers: Secondary | ICD-10-CM

## 2016-07-25 DIAGNOSIS — Z170 Estrogen receptor positive status [ER+]: Secondary | ICD-10-CM | POA: Diagnosis not present

## 2016-07-25 DIAGNOSIS — C50919 Malignant neoplasm of unspecified site of unspecified female breast: Secondary | ICD-10-CM

## 2016-07-25 DIAGNOSIS — Z79899 Other long term (current) drug therapy: Secondary | ICD-10-CM

## 2016-07-25 DIAGNOSIS — D709 Neutropenia, unspecified: Secondary | ICD-10-CM | POA: Diagnosis not present

## 2016-07-25 DIAGNOSIS — C7951 Secondary malignant neoplasm of bone: Secondary | ICD-10-CM | POA: Diagnosis not present

## 2016-07-25 DIAGNOSIS — C50811 Malignant neoplasm of overlapping sites of right female breast: Secondary | ICD-10-CM | POA: Diagnosis not present

## 2016-07-25 LAB — COMPREHENSIVE METABOLIC PANEL
ALBUMIN: 4.6 g/dL (ref 3.5–5.0)
ALT: 17 U/L (ref 14–54)
ANION GAP: 7 (ref 5–15)
AST: 43 U/L — AB (ref 15–41)
Alkaline Phosphatase: 84 U/L (ref 38–126)
BILIRUBIN TOTAL: 0.6 mg/dL (ref 0.3–1.2)
BUN: 15 mg/dL (ref 6–20)
CHLORIDE: 104 mmol/L (ref 101–111)
CO2: 26 mmol/L (ref 22–32)
Calcium: 8.9 mg/dL (ref 8.9–10.3)
Creatinine, Ser: 0.8 mg/dL (ref 0.44–1.00)
GFR calc Af Amer: >60 mL/min (ref >60)
GFR calc non Af Amer: >60 mL/min (ref >60)
GLUCOSE: 103 mg/dL — AB (ref 65–99)
POTASSIUM: 4.2 mmol/L (ref 3.5–5.1)
SODIUM: 137 mmol/L (ref 135–145)
Total Protein: 7.6 g/dL (ref 6.5–8.1)

## 2016-07-25 LAB — CBC WITH DIFFERENTIAL/PLATELET
BASOS ABS: 0 10*3/uL (ref 0–0.1)
BASOS PCT: 1 %
EOS ABS: 0 10*3/uL (ref 0–0.7)
Eosinophils Relative: 2 %
HEMATOCRIT: 31.1 % — AB (ref 35.0–47.0)
Hemoglobin: 10.8 g/dL — ABNORMAL LOW (ref 12.0–16.0)
Lymphocytes Relative: 32 %
Lymphs Abs: 0.5 10*3/uL — ABNORMAL LOW (ref 1.0–3.6)
MCH: 35.9 pg — ABNORMAL HIGH (ref 26.0–34.0)
MCHC: 34.9 g/dL (ref 32.0–36.0)
MCV: 103 fL — ABNORMAL HIGH (ref 80.0–100.0)
MONO ABS: 0.1 10*3/uL — AB (ref 0.2–0.9)
Monocytes Relative: 6 %
NEUTROS ABS: 0.9 10*3/uL — AB (ref 1.4–6.5)
Neutrophils Relative %: 59 %
PLATELETS: 140 10*3/uL — AB (ref 150–440)
RBC: 3.02 MIL/uL — ABNORMAL LOW (ref 3.80–5.20)
RDW: 16.1 % — AB (ref 11.5–14.5)
WBC: 1.6 10*3/uL — ABNORMAL LOW (ref 3.6–11.0)

## 2016-07-25 MED ORDER — DENOSUMAB 120 MG/1.7ML ~~LOC~~ SOLN
120.0000 mg | Freq: Once | SUBCUTANEOUS | Status: AC
  Administered 2016-07-25: 120 mg via SUBCUTANEOUS
  Filled 2016-07-25: qty 1.7

## 2016-07-25 MED ORDER — FULVESTRANT 250 MG/5ML IM SOLN
500.0000 mg | INTRAMUSCULAR | Status: DC
  Administered 2016-07-25: 500 mg via INTRAMUSCULAR
  Filled 2016-07-25: qty 10

## 2016-07-25 NOTE — Progress Notes (Signed)
[place] OFFICE PROGRESS NOTE  Patient Care Team: Gerilyn Pilgrim, FNP as PCP - General (Nurse Practitioner)  Cancer of breast [facility])   Staging form: Breast, AJCC 7th Edition     Clinical: Stage IV (T1b, N0, M1) - Signed by Forest Gleason, MD on 05/09/2015    Oncology History   1carcinoma of breast biopsy done from the mass at 9:00 position (right breast) at [facility] on October 20, 2014.   T1 cN0 M1 stage IV disease [id number] biopsies positive for invasive mammary carcinoma with both Dr. lobular features.  Estrogen receptor +94%.  Progesterone receptor +95%.  HER-2/neu receptor -0. 2.  X-rays of the lumbar spine done in now September of 2015 revealed diffuse sclerotic bone lesion. 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  Liver is normal.  There are multiple sub-centimeter porta hepatis and pelvic and inguinal lymphadenopathy 4.  Recent has received 2 weeks of radiation therapy to the right hip at [facility] 5.  Has been started on letrozole 3 weeks ago (November 22, 2014) 6.recent has been switched to [facility] and letrozole 2 weeks ago starting from January 2016- (IBRANCE 125 mg by mouth for 3 weeks with one week off ) dose of IBRANCE has been reduced 100 mg because of neutropenia  # June 2017- CT Stable Bone mets;  Bone scan- slight increase in left femur uptake/ rising TM; MRI pelvis STABLE- cont Ibrance  # Increasing TM; July 19th 2017- START fasolodex + cont IBRANCE       Malignant neoplasm of overlapping sites of right breast ([place])   06/15/2016 Initial Diagnosis    Malignant neoplasm of overlapping sites of right breast [facility])      INTERVAL HISTORY:  Elizabeth Mccoy 60 y.o.  female pleasant patient above history of Metastatic breast cancer to bone currently on ibrance plus Faslodex Is here for follow-up.   Patient denies any significant  soreness at the injection site. She denies any worsening pain in the left hip. Denies any shortness of breath or chest pain. Denies any nausea vomiting headaches.  Appetite is good. No weight loss. Her tingling or numbness. No fevers or chills. No skin rash. No diarrhea. No headaches or vision changes.  REVIEW OF SYSTEMS:  A complete 10 point review of system is done which is negative except mentioned above/history of present illness.   PAST MEDICAL HISTORY :  Past Medical History:  Diagnosis Date  . Breast cancer, right ([place]) 2015   rad tx's on right hip.  . Menopause 12/10/2006   2008    PAST SURGICAL HISTORY :  No past surgical history on file.  FAMILY HISTORY :  No family history on file.  SOCIAL HISTORY:   Social History  Substance Use Topics  . Smoking status: Never Smoker  . Smokeless tobacco: Never Used  . Alcohol use No    ALLERGIES:  is allergic to beef extract; pork (porcine) protein; and beef-derived products.  MEDICATIONS:  Current Outpatient Prescriptions  Medication Sig Dispense Refill  . Ascorbic Acid (VITAMIN C) 100 MG tablet Take 100 mg by mouth daily.    . calcium-vitamin D (OSCAL WITH D) 500-200 MG-UNIT per tablet Take 1 tablet by mouth 3 (three) times daily.    Marland Kitchen gabapentin (NEURONTIN) 300 MG capsule Take 1 capsule (300 mg total) by mouth 2 (two) times daily. 60 capsule 3  . letrozole (  FEMARA) 2.5 MG tablet TAKE ONE TABLET BY MOUTH EVERY DAY. 30 tablet 0  . levofloxacin (LEVAQUIN) 500 MG tablet Take 1 tablet (500 mg total) by mouth daily. 7 tablet 0  . Multiple Vitamins-Minerals (MULTIVITAMIN WITH MINERALS) tablet Take 1 tablet by mouth daily.    . palbociclib (IBRANCE) 100 MG capsule Take 1 capsule (100 mg total) by mouth daily. Take for 21 days then 7 days off. 21 capsule 11   No current facility-administered medications for this visit.     PHYSICAL EXAMINATION: ECOG PERFORMANCE STATUS: 0 - Asymptomatic  BP 120/74 (BP Location: Left Arm, Patient  Position: Sitting)   Pulse 62   Temp 98.1 F (36.7 C) (Tympanic)   Resp 18   Wt 143 lb (64.9 kg)   LMP  (Approximate) Comment: over 7 years ago  BMI 26.50 kg/m   Filed Weights   07/25/16 0939  Weight: 143 lb (64.9 kg)    GENERAL: Well-nourished well-developed; Alert, no distress and comfortable.   Accompanied by her mother and sister. EYES: no pallor or icterus OROPHARYNX: no thrush or ulceration; good dentition  NECK: supple, no masses felt LYMPH:  no palpable lymphadenopathy in the cervical, axillary or inguinal regions LUNGS: clear to auscultation and  No wheeze or crackles HEART/CVS: regular rate & rhythm and no murmurs; No lower extremity edema ABDOMEN:abdomen soft, non-tender and normal bowel sounds Musculoskeletal:no cyanosis of digits and no clubbing  PSYCH: alert & oriented x 3 with fluent speech NEURO: no focal motor/sensory deficits SKIN:  no rashes or significant lesions  LABORATORY DATA:  I have reviewed the data as listed    Component Value Date/Time   NA 137 07/25/2016 0905   NA 139 04/06/2015 1006   K 4.2 07/25/2016 0905   K 4.0 04/06/2015 1006   CL 104 07/25/2016 0905   CL 105 04/06/2015 1006   CO2 26 07/25/2016 0905   CO2 28 04/06/2015 1006   GLUCOSE 103 (H) 07/25/2016 0905   GLUCOSE 90 04/06/2015 1006   BUN 15 07/25/2016 0905   BUN 20 04/06/2015 1006   CREATININE 0.80 07/25/2016 0905   CREATININE 0.78 04/06/2015 1006   CALCIUM 8.9 07/25/2016 0905   CALCIUM 9.5 04/06/2015 1006   PROT 7.6 07/25/2016 0905   PROT 7.8 04/06/2015 1006   ALBUMIN 4.6 07/25/2016 0905   ALBUMIN 4.7 04/06/2015 1006   AST 43 (H) 07/25/2016 0905   AST 17 04/06/2015 1006   ALT 17 07/25/2016 0905   ALT 14 04/06/2015 1006   ALKPHOS 84 07/25/2016 0905   ALKPHOS 62 04/06/2015 1006   BILITOT 0.6 07/25/2016 0905   BILITOT 0.6 04/06/2015 1006   GFRNONAA >60 07/25/2016 0905   GFRNONAA >60 04/06/2015 1006   GFRAA >60 07/25/2016 0905   GFRAA >60 04/06/2015 1006    No  results found for: SPEP, UPEP  Lab Results  Component Value Date   WBC 1.6 (L) 07/25/2016   NEUTROABS 0.9 (L) 07/25/2016   HGB 10.8 (L) 07/25/2016   HCT 31.1 (L) 07/25/2016   MCV 103.0 (H) 07/25/2016   PLT 140 (L) 07/25/2016      Chemistry      Component Value Date/Time   NA 137 07/25/2016 0905   NA 139 04/06/2015 1006   K 4.2 07/25/2016 0905   K 4.0 04/06/2015 1006   CL 104 07/25/2016 0905   CL 105 04/06/2015 1006   CO2 26 07/25/2016 0905   CO2 28 04/06/2015 1006   BUN 15 07/25/2016 0905  BUN 20 04/06/2015 1006   CREATININE 0.80 07/25/2016 0905   CREATININE 0.78 04/06/2015 1006      Component Value Date/Time   CALCIUM 8.9 07/25/2016 0905   CALCIUM 9.5 04/06/2015 1006   ALKPHOS 84 07/25/2016 0905   ALKPHOS 62 04/06/2015 1006   AST 43 (H) 07/25/2016 0905   AST 17 04/06/2015 1006   ALT 17 07/25/2016 0905   ALT 14 04/06/2015 1006   BILITOT 0.6 07/25/2016 0905   BILITOT 0.6 04/06/2015 1006       RADIOGRAPHIC STUDIES: I have personally reviewed the radiological images as listed and agreed with the findings in the report. No results found.   ASSESSMENT & PLAN:  Malignant neoplasm of overlapping sites of right breast [facility]) Metastatic breast cancer to the bone- most recent imaging June 2017- shows no residual disease; stable bone lesions question slight increase in left femoral uptake; Marland Kitchen Tumor marker trending up/- 200+   # on July 2017-  faslodex loading followed by faslodex monthly with ibrance. Will need a scan in 2-3 months. Also monitor Ca-27-29.   # Neutropenia 0.9- coming off ibrance 100 today.   # Multiple bone lesions- on X-Geva tolerating well. Ca+ vit D  # follow up with me in 4 weeks x-geva/ labs- ca27-27/ faslodex.    Orders Placed This Encounter  Procedures  . Comprehensive metabolic panel    Standing Status:   Future    Standing Expiration Date:   08/22/2017  . CBC with Differential    Standing Status:   Future    Standing Expiration Date:    08/22/2017  . Cancer antigen 27.29    Standing Status:   Future    Standing Expiration Date:   08/22/2017   All questions were answered. The patient knows to call the clinic with any problems, questions or concerns.      Cammie Sickle, MD 07/25/2016 7:50 PM

## 2016-07-25 NOTE — Progress Notes (Signed)
Patient states for past week her right breast has been irritated.  States it feels full and tingles.

## 2016-07-25 NOTE — Assessment & Plan Note (Addendum)
Metastatic breast cancer to the bone- most recent imaging June 2017- shows no residual disease; stable bone lesions question slight increase in left femoral uptake; Marland Kitchen Tumor marker trending up/- 200+   # on July 2017-  faslodex loading followed by faslodex monthly with ibrance. Will need a scan in 2-3 months. Also monitor Ca-27-29.   # Neutropenia 0.9- coming off ibrance 100 today.   # Multiple bone lesions- on X-Geva tolerating well. Ca+ vit D  # follow up with me in 4 weeks x-geva/ labs- ca27-27/ faslodex.

## 2016-07-26 ENCOUNTER — Encounter: Payer: Self-pay | Admitting: *Deleted

## 2016-07-26 LAB — CANCER ANTIGEN 27.29: CA 27.29: 461.2 U/mL — AB (ref 0.0–38.6)

## 2016-07-26 NOTE — Progress Notes (Signed)
  Oncology Nurse Navigator Documentation  Navigator Location: [facility] On[facility] (07/26/16 1400) Navigator Encounter Type: Telephone (07/26/16 1400) Telephone: Incoming Call (07/26/16 1400)           Treatment Phase: Active Tx (07/26/16 1400)     Interventions: Coordination of Care (07/26/16 1400)   Coordination of Care: Other (07/26/16 1400)                  Time Spent with Patient: 30 (07/26/16 1400)   Patient called to get the results of her ca 27.29.  Informed patient of her results.  She was very concerned that they have doubled.  Discussed results with Dr. Rogue Bussing.  He had me review with the patient that he discussed this with her yesterday, and he had anticipated that it may go up this month.  He informed her he was looking at how she felt, and was actually doing, that just the numbers.  Reminded of her appointment on 08/22/16.  She is to call if she has any questions or needs.

## 2016-08-22 ENCOUNTER — Inpatient Hospital Stay (HOSPITAL_BASED_OUTPATIENT_CLINIC_OR_DEPARTMENT_OTHER): Payer: BLUE CROSS/BLUE SHIELD | Admitting: Internal Medicine

## 2016-08-22 ENCOUNTER — Inpatient Hospital Stay: Payer: BLUE CROSS/BLUE SHIELD

## 2016-08-22 ENCOUNTER — Encounter: Payer: Self-pay | Admitting: Internal Medicine

## 2016-08-22 ENCOUNTER — Inpatient Hospital Stay: Payer: BLUE CROSS/BLUE SHIELD | Attending: Internal Medicine

## 2016-08-22 VITALS — BP 143/84 | HR 71 | Temp 97.4°F | Ht 61.0 in | Wt 139.6 lb

## 2016-08-22 DIAGNOSIS — Z170 Estrogen receptor positive status [ER+]: Secondary | ICD-10-CM | POA: Insufficient documentation

## 2016-08-22 DIAGNOSIS — R0789 Other chest pain: Secondary | ICD-10-CM | POA: Diagnosis not present

## 2016-08-22 DIAGNOSIS — C50811 Malignant neoplasm of overlapping sites of right female breast: Secondary | ICD-10-CM | POA: Insufficient documentation

## 2016-08-22 DIAGNOSIS — C7951 Secondary malignant neoplasm of bone: Secondary | ICD-10-CM

## 2016-08-22 DIAGNOSIS — C50919 Malignant neoplasm of unspecified site of unspecified female breast: Secondary | ICD-10-CM

## 2016-08-22 DIAGNOSIS — N1330 Unspecified hydronephrosis: Secondary | ICD-10-CM | POA: Diagnosis not present

## 2016-08-22 DIAGNOSIS — R918 Other nonspecific abnormal finding of lung field: Secondary | ICD-10-CM

## 2016-08-22 DIAGNOSIS — Z923 Personal history of irradiation: Secondary | ICD-10-CM | POA: Insufficient documentation

## 2016-08-22 DIAGNOSIS — G893 Neoplasm related pain (acute) (chronic): Secondary | ICD-10-CM

## 2016-08-22 DIAGNOSIS — D709 Neutropenia, unspecified: Secondary | ICD-10-CM

## 2016-08-22 DIAGNOSIS — Z79899 Other long term (current) drug therapy: Secondary | ICD-10-CM | POA: Diagnosis not present

## 2016-08-22 DIAGNOSIS — R200 Anesthesia of skin: Secondary | ICD-10-CM | POA: Insufficient documentation

## 2016-08-22 DIAGNOSIS — Z79818 Long term (current) use of other agents affecting estrogen receptors and estrogen levels: Secondary | ICD-10-CM

## 2016-08-22 LAB — CBC WITH DIFFERENTIAL/PLATELET
BASOS ABS: 0 10*3/uL (ref 0–0.1)
BASOS PCT: 1 %
EOS ABS: 0 10*3/uL (ref 0–0.7)
Eosinophils Relative: 2 %
HCT: 30.1 % — ABNORMAL LOW (ref 35.0–47.0)
HEMOGLOBIN: 10.5 g/dL — AB (ref 12.0–16.0)
Lymphocytes Relative: 30 %
Lymphs Abs: 0.5 10*3/uL — ABNORMAL LOW (ref 1.0–3.6)
MCH: 35.5 pg — ABNORMAL HIGH (ref 26.0–34.0)
MCHC: 35 g/dL (ref 32.0–36.0)
MCV: 101.4 fL — ABNORMAL HIGH (ref 80.0–100.0)
MONOS PCT: 7 %
Monocytes Absolute: 0.1 10*3/uL — ABNORMAL LOW (ref 0.2–0.9)
NEUTROS PCT: 60 %
Neutro Abs: 1 10*3/uL — ABNORMAL LOW (ref 1.4–6.5)
Platelets: 135 10*3/uL — ABNORMAL LOW (ref 150–440)
RBC: 2.97 MIL/uL — AB (ref 3.80–5.20)
RDW: 16.3 % — ABNORMAL HIGH (ref 11.5–14.5)
WBC: 1.7 10*3/uL — AB (ref 3.6–11.0)

## 2016-08-22 LAB — COMPREHENSIVE METABOLIC PANEL
ALK PHOS: 116 U/L (ref 38–126)
ALT: 20 U/L (ref 14–54)
ANION GAP: 7 (ref 5–15)
AST: 69 U/L — ABNORMAL HIGH (ref 15–41)
Albumin: 4.5 g/dL (ref 3.5–5.0)
BUN: 11 mg/dL (ref 6–20)
CALCIUM: 9.1 mg/dL (ref 8.9–10.3)
CO2: 27 mmol/L (ref 22–32)
CREATININE: 0.78 mg/dL (ref 0.44–1.00)
Chloride: 104 mmol/L (ref 101–111)
Glucose, Bld: 112 mg/dL — ABNORMAL HIGH (ref 65–99)
Potassium: 3.8 mmol/L (ref 3.5–5.1)
Sodium: 138 mmol/L (ref 135–145)
TOTAL PROTEIN: 7.2 g/dL (ref 6.5–8.1)
Total Bilirubin: 0.7 mg/dL (ref 0.3–1.2)

## 2016-08-22 MED ORDER — DENOSUMAB 120 MG/1.7ML ~~LOC~~ SOLN
120.0000 mg | Freq: Once | SUBCUTANEOUS | Status: AC
  Administered 2016-08-22: 120 mg via SUBCUTANEOUS
  Filled 2016-08-22: qty 1.7

## 2016-08-22 MED ORDER — FULVESTRANT 250 MG/5ML IM SOLN
500.0000 mg | INTRAMUSCULAR | Status: DC
  Administered 2016-08-22: 500 mg via INTRAMUSCULAR
  Filled 2016-08-22: qty 10

## 2016-08-22 NOTE — Assessment & Plan Note (Addendum)
Metastatic breast cancer to the bone- most recent imaging June 2017- shows no residual disease; stable bone lesions question slight increase in left femoral uptake; Marland Kitchen Tumor marker trending up/- 400+. CT C/AP/P- is ordered today.    # on July 2017-  faslodex loading followed by faslodex monthly with ibrance. Also monitor Ca-27-29. Last dose ibrance today; not to re-start in 1 week. Will likely need change of treatments. Discussed the clinical trial Taxol versus eribulin. Patient interested.  # Right chest wall pain- on Ibuprofen;   # Neutropenia 1.0- coming off ibrance 100 today.   # Multiple bone lesions- on X-Geva tolerating well. Ca+ vit D  # follow up with me in 2 weeks; CT-bone scan scan prior/ no labs. checked with [facility] trial RN re: clinical trial. If patient is to progressive disease on the imaging/tumor marker-clinical data reviewed option

## 2016-08-22 NOTE — Progress Notes (Signed)
[place] OFFICE PROGRESS NOTE  Patient Care Team: Gerilyn Pilgrim, FNP as PCP - General (Nurse Practitioner)  Cancer of breast [facility])   Staging form: Breast, AJCC 7th Edition     Clinical: Stage IV (T1b, N0, M1) - Signed by Forest Gleason, MD on 05/09/2015    Oncology History   1carcinoma of breast biopsy done from the mass at 9:00 position (right breast) at [facility] on October 20, 2014.   T1 cN0 M1 stage IV disease [id number] biopsies positive for invasive mammary carcinoma with both Dr. lobular features.  Estrogen receptor +94%.  Progesterone receptor +95%.  HER-2/neu receptor -0. 2.  X-rays of the lumbar spine done in now September of 2015 revealed diffuse sclerotic bone lesion. 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  Liver is normal.  There are multiple sub-centimeter porta hepatis and pelvic and inguinal lymphadenopathy 4.  Recent has received 2 weeks of radiation therapy to the right hip at [facility] 5.  Has been started on letrozole 3 weeks ago (November 22, 2014) 6.recent has been switched to [facility] and letrozole 2 weeks ago starting from January 2016- (IBRANCE 125 mg by mouth for 3 weeks with one week off ) dose of IBRANCE has been reduced 100 mg because of neutropenia  # June 2017- CT Stable Bone mets;  Bone scan- slight increase in left femur uptake/ rising TM; MRI pelvis STABLE- cont Ibrance  # Increasing TM; July 19th 2017- START fasolodex + cont IBRANCE       Malignant neoplasm of overlapping sites of right breast [facility])    INTERVAL HISTORY:  Margaretmary Bayley Siebenaler 60 y.o.  female pleasant patient above history of Metastatic breast cancer to bone currently on ibrance plus Faslodex Is here for follow-up.   Patient complains of pain in the right posterior ribs. The last 2 weeks. She has been taking ibuprofen. 3 of scale of 10.  Otherwise She denies any worsening pain in the left hip. Denies any shortness of breath or chest pain. Denies any nausea vomiting headaches.  Appetite is good. No weight loss. Her tingling or numbness. No fevers or chills. No skin rash. No diarrhea. No headaches or vision changes.  REVIEW OF SYSTEMS:  A complete 10 point review of system is done which is negative except mentioned above/history of present illness.   PAST MEDICAL HISTORY :  Past Medical History:  Diagnosis Date  . Breast cancer, right ([place]) 2015   rad tx's on right hip.  . Menopause 12/10/2006   2008    PAST SURGICAL HISTORY :  History reviewed. No pertinent surgical history.  FAMILY HISTORY :  History reviewed. No pertinent family history.  SOCIAL HISTORY:   Social History  Substance Use Topics  . Smoking status: Never Smoker  . Smokeless tobacco: Never Used  . Alcohol use No    ALLERGIES:  is allergic to beef extract; pork (porcine) protein; and beef-derived products.  MEDICATIONS:  Current Outpatient Prescriptions  Medication Sig Dispense Refill  . Ascorbic Acid (VITAMIN C) 100 MG tablet Take 100 mg by mouth daily.    . calcium-vitamin D (OSCAL WITH D) 500-200 MG-UNIT per tablet Take 1 tablet by mouth 3 (three) times daily.    Marland Kitchen levofloxacin (LEVAQUIN) 500 MG tablet Take 1 tablet (500 mg total) by mouth daily. 7 tablet 0  . Multiple Vitamins-Minerals (MULTIVITAMIN WITH MINERALS) tablet Take  1 tablet by mouth daily.    . palbociclib (IBRANCE) 100 MG capsule Take 1 capsule (100 mg total) by mouth daily. Take for 21 days then 7 days off. 21 capsule 11   No current facility-administered medications for this visit.    Facility-Administered Medications Ordered in Other Visits  Medication Dose Route Frequency Provider Last Rate Last Dose  . fulvestrant (FASLODEX) injection 500 mg  500 mg Intramuscular Q30 days Cammie Sickle, MD   500 mg at 08/22/16 1152    PHYSICAL EXAMINATION: ECOG PERFORMANCE STATUS: 0  - Asymptomatic  BP (!) 143/84 (BP Location: Left Arm, Patient Position: Sitting)   Pulse 71   Temp 97.4 F (36.3 C) (Tympanic)   Ht 5' 1"  (1.549 m)   Wt 139 lb 9.6 oz (63.3 kg)   LMP  (Approximate) Comment: over 7 years ago  BMI 26.38 kg/m   Filed Weights   08/22/16 1037  Weight: 139 lb 9.6 oz (63.3 kg)    GENERAL: Well-nourished well-developed; Alert, no distress and comfortable.   Accompanied by her mother and sister. EYES: no pallor or icterus OROPHARYNX: no thrush or ulceration; good dentition  NECK: supple, no masses felt LYMPH:  no palpable lymphadenopathy in the cervical, axillary or inguinal regions LUNGS: clear to auscultation and  No wheeze or crackles HEART/CVS: regular rate & rhythm and no murmurs; No lower extremity edema ABDOMEN:abdomen soft, non-tender and normal bowel sounds Musculoskeletal:no cyanosis of digits and no clubbing  PSYCH: alert & oriented x 3 with fluent speech NEURO: no focal motor/sensory deficits SKIN:  no rashes or significant lesions  LABORATORY DATA:  I have reviewed the data as listed    Component Value Date/Time   NA 138 08/22/2016 1020   NA 139 04/06/2015 1006   K 3.8 08/22/2016 1020   K 4.0 04/06/2015 1006   CL 104 08/22/2016 1020   CL 105 04/06/2015 1006   CO2 27 08/22/2016 1020   CO2 28 04/06/2015 1006   GLUCOSE 112 (H) 08/22/2016 1020   GLUCOSE 90 04/06/2015 1006   BUN 11 08/22/2016 1020   BUN 20 04/06/2015 1006   CREATININE 0.78 08/22/2016 1020   CREATININE 0.78 04/06/2015 1006   CALCIUM 9.1 08/22/2016 1020   CALCIUM 9.5 04/06/2015 1006   PROT 7.2 08/22/2016 1020   PROT 7.8 04/06/2015 1006   ALBUMIN 4.5 08/22/2016 1020   ALBUMIN 4.7 04/06/2015 1006   AST 69 (H) 08/22/2016 1020   AST 17 04/06/2015 1006   ALT 20 08/22/2016 1020   ALT 14 04/06/2015 1006   ALKPHOS 116 08/22/2016 1020   ALKPHOS 62 04/06/2015 1006   BILITOT 0.7 08/22/2016 1020   BILITOT 0.6 04/06/2015 1006   GFRNONAA >60 08/22/2016 1020   GFRNONAA  >60 04/06/2015 1006   GFRAA >60 08/22/2016 1020   GFRAA >60 04/06/2015 1006    No results found for: SPEP, UPEP  Lab Results  Component Value Date   WBC 1.7 (L) 08/22/2016   NEUTROABS 1.0 (L) 08/22/2016   HGB 10.5 (L) 08/22/2016   HCT 30.1 (L) 08/22/2016   MCV 101.4 (H) 08/22/2016   PLT 135 (L) 08/22/2016      Chemistry      Component Value Date/Time   NA 138 08/22/2016 1020   NA 139 04/06/2015 1006   K 3.8 08/22/2016 1020   K 4.0 04/06/2015 1006   CL 104 08/22/2016 1020   CL 105 04/06/2015 1006   CO2 27 08/22/2016 1020   CO2 28 04/06/2015 1006  BUN 11 08/22/2016 1020   BUN 20 04/06/2015 1006   CREATININE 0.78 08/22/2016 1020   CREATININE 0.78 04/06/2015 1006      Component Value Date/Time   CALCIUM 9.1 08/22/2016 1020   CALCIUM 9.5 04/06/2015 1006   ALKPHOS 116 08/22/2016 1020   ALKPHOS 62 04/06/2015 1006   AST 69 (H) 08/22/2016 1020   AST 17 04/06/2015 1006   ALT 20 08/22/2016 1020   ALT 14 04/06/2015 1006   BILITOT 0.7 08/22/2016 1020   BILITOT 0.6 04/06/2015 1006       RADIOGRAPHIC STUDIES: I have personally reviewed the radiological images as listed and agreed with the findings in the report. No results found.   ASSESSMENT & PLAN:  Malignant neoplasm of overlapping sites of right breast [facility]) Metastatic breast cancer to the bone- most recent imaging June 2017- shows no residual disease; stable bone lesions question slight increase in left femoral uptake; Marland Kitchen Tumor marker trending up/- 400+. CT C/AP/P- is ordered today.    # on July 2017-  faslodex loading followed by faslodex monthly with ibrance. Also monitor Ca-27-29. Last dose ibrance today; not to re-start in 1 week. Will likely need change of treatments. Discussed the clinical trial Taxol versus eribulin. Patient interested.  # Right chest wall pain- on Ibuprofen;   # Neutropenia 1.0- coming off ibrance 100 today.   # Multiple bone lesions- on X-[place] tolerating well. Ca+ vit D  # follow up with  me in 2 weeks; CT-bone scan scan prior/ no labs. checked with [facility] trial RN re: clinical trial. If patient is to progressive disease on the imaging/tumor marker-clinical data reviewed option     Orders Placed This Encounter  Procedures  . CT ABDOMEN PELVIS W CONTRAST    Standing Status:   Future    Standing Expiration Date:   11/21/2017    Order Specific Question:   Reason for Exam (SYMPTOM  OR DIAGNOSIS REQUIRED)    Answer:   breast cancer    Order Specific Question:   Is the patient pregnant?    Answer:   No    Order Specific Question:   Preferred imaging location?    Answer:   [facility]  . CT CHEST W CONTRAST    Standing Status:   Future    Standing Expiration Date:   10/22/2017    Order Specific Question:   Reason for Exam (SYMPTOM  OR DIAGNOSIS REQUIRED)    Answer:   breast cancer    Order Specific Question:   Is the patient pregnant?    Answer:   No    Order Specific Question:   Preferred imaging location?    Answer:   [facility]  . NM Bone Scan Whole Body    Standing Status:   Future    Standing Expiration Date:   10/22/2017    Order Specific Question:   Reason for Exam (SYMPTOM  OR DIAGNOSIS REQUIRED)    Answer:   metastatic breast cnacer    Order Specific Question:   Is the patient pregnant?    Answer:   No    Order Specific Question:   Preferred imaging location?    Answer:   [facility]   All questions were answered. The patient knows to call the clinic with any problems, questions or concerns.      Cammie Sickle, MD 08/22/2016 1:38 PM

## 2016-08-23 LAB — CANCER ANTIGEN 27.29: CA 27.29: 723.7 U/mL — ABNORMAL HIGH (ref 0.0–38.6)

## 2016-08-29 ENCOUNTER — Ambulatory Visit: Admission: RE | Admit: 2016-08-29 | Payer: BLUE CROSS/BLUE SHIELD | Source: Ambulatory Visit

## 2016-08-29 ENCOUNTER — Other Ambulatory Visit: Payer: Self-pay | Admitting: Internal Medicine

## 2016-08-29 ENCOUNTER — Encounter
Admission: RE | Admit: 2016-08-29 | Discharge: 2016-08-29 | Disposition: A | Discharge status: Home / Self Care | Payer: BLUE CROSS/BLUE SHIELD | Source: Ambulatory Visit | Attending: Internal Medicine | Admitting: Internal Medicine

## 2016-08-29 ENCOUNTER — Ambulatory Visit
Admission: RE | Admit: 2016-08-29 | Discharge: 2016-08-29 | Disposition: A | Discharge status: Home / Self Care | Payer: BLUE CROSS/BLUE SHIELD | Source: Ambulatory Visit | Attending: Internal Medicine | Admitting: Internal Medicine

## 2016-08-29 DIAGNOSIS — C50811 Malignant neoplasm of overlapping sites of right female breast: Secondary | ICD-10-CM | POA: Insufficient documentation

## 2016-08-29 DIAGNOSIS — J90 Pleural effusion, not elsewhere classified: Secondary | ICD-10-CM | POA: Diagnosis not present

## 2016-08-29 DIAGNOSIS — N1330 Unspecified hydronephrosis: Secondary | ICD-10-CM | POA: Diagnosis not present

## 2016-08-29 DIAGNOSIS — C787 Secondary malignant neoplasm of liver and intrahepatic bile duct: Secondary | ICD-10-CM | POA: Diagnosis not present

## 2016-08-29 DIAGNOSIS — R9341 Abnormal radiologic findings on diagnostic imaging of renal pelvis, ureter, or bladder: Secondary | ICD-10-CM | POA: Diagnosis not present

## 2016-08-29 DIAGNOSIS — R933 Abnormal findings on diagnostic imaging of other parts of digestive tract: Secondary | ICD-10-CM | POA: Diagnosis not present

## 2016-08-29 DIAGNOSIS — C7951 Secondary malignant neoplasm of bone: Secondary | ICD-10-CM | POA: Diagnosis not present

## 2016-08-29 MED ORDER — TECHNETIUM TC 99M MEDRONATE IV KIT
23.5600 | PACK | Freq: Once | INTRAVENOUS | Status: AC | PRN
  Administered 2016-08-29: 23.56 via INTRAVENOUS

## 2016-08-29 MED ORDER — IOPAMIDOL (ISOVUE-300) INJECTION 61%
100.0000 mL | Freq: Once | INTRAVENOUS | Status: AC | PRN
  Administered 2016-08-29: 100 mL via INTRAVENOUS

## 2016-08-30 ENCOUNTER — Other Ambulatory Visit: Payer: Self-pay | Admitting: *Deleted

## 2016-08-30 ENCOUNTER — Inpatient Hospital Stay (HOSPITAL_BASED_OUTPATIENT_CLINIC_OR_DEPARTMENT_OTHER): Payer: BLUE CROSS/BLUE SHIELD | Admitting: Internal Medicine

## 2016-08-30 ENCOUNTER — Telehealth: Payer: Self-pay | Admitting: *Deleted

## 2016-08-30 DIAGNOSIS — Z79818 Long term (current) use of other agents affecting estrogen receptors and estrogen levels: Secondary | ICD-10-CM | POA: Diagnosis not present

## 2016-08-30 DIAGNOSIS — N1330 Unspecified hydronephrosis: Secondary | ICD-10-CM

## 2016-08-30 DIAGNOSIS — C50811 Malignant neoplasm of overlapping sites of right female breast: Secondary | ICD-10-CM

## 2016-08-30 DIAGNOSIS — R0789 Other chest pain: Secondary | ICD-10-CM

## 2016-08-30 DIAGNOSIS — Z923 Personal history of irradiation: Secondary | ICD-10-CM

## 2016-08-30 DIAGNOSIS — Z170 Estrogen receptor positive status [ER+]: Secondary | ICD-10-CM

## 2016-08-30 DIAGNOSIS — C7951 Secondary malignant neoplasm of bone: Secondary | ICD-10-CM | POA: Diagnosis not present

## 2016-08-30 DIAGNOSIS — R200 Anesthesia of skin: Secondary | ICD-10-CM

## 2016-08-30 DIAGNOSIS — G893 Neoplasm related pain (acute) (chronic): Secondary | ICD-10-CM

## 2016-08-30 DIAGNOSIS — D709 Neutropenia, unspecified: Secondary | ICD-10-CM

## 2016-08-30 DIAGNOSIS — Z79899 Other long term (current) drug therapy: Secondary | ICD-10-CM

## 2016-08-30 DIAGNOSIS — R918 Other nonspecific abnormal finding of lung field: Secondary | ICD-10-CM

## 2016-08-30 NOTE — Telephone Encounter (Signed)
Contacted patient - explained that the scan results were abn. And md needs her to come in this afternoon to discuss results.  Pt states that she would have to call me back to determine when she can come. 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

## 2016-08-30 NOTE — Telephone Encounter (Signed)
Left msg 1040-to have pt call cancer center asap so that she can come in today for results.

## 2016-08-30 NOTE — Telephone Encounter (Signed)
Pt called back. Left msg. She is on her way for the apt with Dr. Jacinto Reap.

## 2016-08-30 NOTE — Telephone Encounter (Signed)
Contacted patient per v/o Dr. Rogue Bussing- asked pt to call our office back asap. MD would like to see pt in office today to discuss her test results.

## 2016-08-31 ENCOUNTER — Other Ambulatory Visit: Payer: Self-pay | Admitting: Internal Medicine

## 2016-08-31 ENCOUNTER — Inpatient Hospital Stay: Payer: BLUE CROSS/BLUE SHIELD

## 2016-08-31 ENCOUNTER — Ambulatory Visit
Admission: RE | Admit: 2016-08-31 | Discharge: 2016-08-31 | Disposition: A | Discharge status: Home / Self Care | Payer: BLUE CROSS/BLUE SHIELD | Source: Ambulatory Visit | Attending: Internal Medicine | Admitting: Internal Medicine

## 2016-08-31 DIAGNOSIS — C50811 Malignant neoplasm of overlapping sites of right female breast: Secondary | ICD-10-CM | POA: Insufficient documentation

## 2016-08-31 LAB — COMPREHENSIVE METABOLIC PANEL
ALBUMIN: 4.4 g/dL (ref 3.5–5.0)
ALK PHOS: 133 U/L — AB (ref 38–126)
ALT: 23 U/L (ref 14–54)
ANION GAP: 8 (ref 5–15)
AST: 67 U/L — ABNORMAL HIGH (ref 15–41)
BUN: 11 mg/dL (ref 6–20)
CALCIUM: 9 mg/dL (ref 8.9–10.3)
CHLORIDE: 103 mmol/L (ref 101–111)
CO2: 26 mmol/L (ref 22–32)
Creatinine, Ser: 0.56 mg/dL (ref 0.44–1.00)
GFR calc Af Amer: >60 mL/min (ref >60)
GFR calc non Af Amer: >60 mL/min (ref >60)
GLUCOSE: 98 mg/dL (ref 65–99)
POTASSIUM: 3.8 mmol/L (ref 3.5–5.1)
SODIUM: 137 mmol/L (ref 135–145)
Total Bilirubin: 0.6 mg/dL (ref 0.3–1.2)
Total Protein: 7.5 g/dL (ref 6.5–8.1)

## 2016-08-31 LAB — CBC WITH DIFFERENTIAL/PLATELET
BASOS PCT: 2 %
Basophils Absolute: 0 10*3/uL (ref 0–0.1)
EOS ABS: 0 10*3/uL (ref 0–0.7)
Eosinophils Relative: 2 %
HCT: 29.8 % — ABNORMAL LOW (ref 35.0–47.0)
HEMOGLOBIN: 10.5 g/dL — AB (ref 12.0–16.0)
Lymphocytes Relative: 30 %
Lymphs Abs: 0.6 10*3/uL — ABNORMAL LOW (ref 1.0–3.6)
MCH: 35.5 pg — ABNORMAL HIGH (ref 26.0–34.0)
MCHC: 35.2 g/dL (ref 32.0–36.0)
MCV: 100.9 fL — ABNORMAL HIGH (ref 80.0–100.0)
MONOS PCT: 15 %
Monocytes Absolute: 0.3 10*3/uL (ref 0.2–0.9)
NEUTROS PCT: 51 %
Neutro Abs: 1 10*3/uL — ABNORMAL LOW (ref 1.4–6.5)
Platelets: 189 10*3/uL (ref 150–440)
RBC: 2.96 MIL/uL — ABNORMAL LOW (ref 3.80–5.20)
RDW: 16.7 % — ABNORMAL HIGH (ref 11.5–14.5)
WBC: 1.9 10*3/uL — AB (ref 3.6–11.0)

## 2016-08-31 NOTE — Progress Notes (Signed)
[place] OFFICE PROGRESS NOTE  Patient Care Team: Gerilyn Pilgrim, FNP as PCP - General (Nurse Practitioner)  Cancer of breast [facility])   Staging form: Breast, AJCC 7th Edition     Clinical: Stage IV (T1b, N0, M1) - Signed by Forest Gleason, MD on 05/09/2015    Oncology History   1carcinoma of breast biopsy done from the mass at 9:00 position (right breast) at [facility] on October 20, 2014.   T1 cN0 M1 stage IV disease [id number] biopsies positive for invasive mammary carcinoma with both Dr. lobular features.  Estrogen receptor +94%.  Progesterone receptor +95%.  HER-2/neu receptor -0. 2.  X-rays of the lumbar spine done in now September of 2015 revealed diffuse sclerotic bone lesion. 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  Liver is normal.  There are multiple sub-centimeter porta hepatis and pelvic and inguinal lymphadenopathy 4.  Recent has received 2 weeks of radiation therapy to the right hip at [facility] 5.  Has been started on letrozole 3 weeks ago (November 22, 2014) 6.recent has been switched to [facility] and letrozole 2 weeks ago starting from January 2016- (IBRANCE 125 mg by mouth for 3 weeks with one week off ) dose of IBRANCE has been reduced 100 mg because of neutropenia  # June 2017- CT Stable Bone mets;  Bone scan- slight increase in left femur uptake/ rising TM; MRI pelvis STABLE- cont Ibrance  # Increasing TM; July 19th 2017- START fasolodex + cont IBRANCE       Malignant neoplasm of overlapping sites of right breast [facility])    INTERVAL HISTORY:  Elizabeth Mccoy 60 y.o.  female pleasant patient above history of Metastatic breast cancer to bone currently on ibrance plus Faslodex Is here for follow-up/ To review the results of her CT scan and bone scan.  Complains of numbness around the sternum and the right upper quadrant. She has  not been taking any regular pain medication.  Otherwise She denies any worsening pain in the left hip. Denies any shortness of breath or chest pain. Denies any nausea vomiting headaches.  Appetite is good. No weight loss. Her tingling or numbness. No fevers or chills. No skin rash. No diarrhea. No headaches or vision changes.  REVIEW OF SYSTEMS:  A complete 10 point review of system is done which is negative except mentioned above/history of present illness.   PAST MEDICAL HISTORY :  Past Medical History:  Diagnosis Date  . Breast cancer, right ([place]) 2015   rad tx's on right hip.  . Menopause 12/10/2006   2008    PAST SURGICAL HISTORY :  No past surgical history on file.  FAMILY HISTORY :  No family history on file.  SOCIAL HISTORY:   Social History  Substance Use Topics  . Smoking status: Never Smoker  . Smokeless tobacco: Never Used  . Alcohol use No    ALLERGIES:  is allergic to beef extract; pork (porcine) protein; and beef-derived products.  MEDICATIONS:  Current Outpatient Prescriptions  Medication Sig Dispense Refill  . calcium-vitamin D (OSCAL WITH D) 500-200 MG-UNIT per tablet Take 1 tablet by mouth 3 (three) times daily.    . Ascorbic Acid (VITAMIN C) 100 MG tablet Take 100 mg by mouth daily.    Marland Kitchen levofloxacin (LEVAQUIN) 500 MG tablet Take 1 tablet (500 mg total) by mouth daily. (Patient not taking: Reported on  08/30/2016) 7 tablet 0  . Multiple Vitamins-Minerals (MULTIVITAMIN WITH MINERALS) tablet Take 1 tablet by mouth daily.    . palbociclib (IBRANCE) 100 MG capsule Take 1 capsule (100 mg total) by mouth daily. Take for 21 days then 7 days off. (Patient not taking: Reported on 08/30/2016) 21 capsule 11   No current facility-administered medications for this visit.     PHYSICAL EXAMINATION: ECOG PERFORMANCE STATUS: 0 - Asymptomatic  LMP  (Approximate)   There were no vitals filed for this visit.  GENERAL: Well-nourished well-developed; Alert, no distress and  comfortable.   Accompanied by her mother and sister. EYES: no pallor or icterus OROPHARYNX: no thrush or ulceration; good dentition  NECK: supple, no masses felt LYMPH:  no palpable lymphadenopathy in the cervical, axillary or inguinal regions LUNGS: clear to auscultation and  No wheeze or crackles HEART/CVS: regular rate & rhythm and no murmurs; No lower extremity edema ABDOMEN:abdomen soft, non-tender and normal bowel sounds Musculoskeletal:no cyanosis of digits and no clubbing  PSYCH: alert & oriented x 3 with fluent speech NEURO: no focal motor/sensory deficits SKIN:  no rashes or significant lesions  LABORATORY DATA:  I have reviewed the data as listed    Component Value Date/Time   NA 138 08/22/2016 1020   NA 139 04/06/2015 1006   K 3.8 08/22/2016 1020   K 4.0 04/06/2015 1006   CL 104 08/22/2016 1020   CL 105 04/06/2015 1006   CO2 27 08/22/2016 1020   CO2 28 04/06/2015 1006   GLUCOSE 112 (H) 08/22/2016 1020   GLUCOSE 90 04/06/2015 1006   BUN 11 08/22/2016 1020   BUN 20 04/06/2015 1006   CREATININE 0.78 08/22/2016 1020   CREATININE 0.78 04/06/2015 1006   CALCIUM 9.1 08/22/2016 1020   CALCIUM 9.5 04/06/2015 1006   PROT 7.2 08/22/2016 1020   PROT 7.8 04/06/2015 1006   ALBUMIN 4.5 08/22/2016 1020   ALBUMIN 4.7 04/06/2015 1006   AST 69 (H) 08/22/2016 1020   AST 17 04/06/2015 1006   ALT 20 08/22/2016 1020   ALT 14 04/06/2015 1006   ALKPHOS 116 08/22/2016 1020   ALKPHOS 62 04/06/2015 1006   BILITOT 0.7 08/22/2016 1020   BILITOT 0.6 04/06/2015 1006   GFRNONAA >60 08/22/2016 1020   GFRNONAA >60 04/06/2015 1006   GFRAA >60 08/22/2016 1020   GFRAA >60 04/06/2015 1006    No results found for: SPEP, UPEP  Lab Results  Component Value Date   WBC 1.7 (L) 08/22/2016   NEUTROABS 1.0 (L) 08/22/2016   HGB 10.5 (L) 08/22/2016   HCT 30.1 (L) 08/22/2016   MCV 101.4 (H) 08/22/2016   PLT 135 (L) 08/22/2016      Chemistry      Component Value Date/Time   NA 138  08/22/2016 1020   NA 139 04/06/2015 1006   K 3.8 08/22/2016 1020   K 4.0 04/06/2015 1006   CL 104 08/22/2016 1020   CL 105 04/06/2015 1006   CO2 27 08/22/2016 1020   CO2 28 04/06/2015 1006   BUN 11 08/22/2016 1020   BUN 20 04/06/2015 1006   CREATININE 0.78 08/22/2016 1020   CREATININE 0.78 04/06/2015 1006      Component Value Date/Time   CALCIUM 9.1 08/22/2016 1020   CALCIUM 9.5 04/06/2015 1006   ALKPHOS 116 08/22/2016 1020   ALKPHOS 62 04/06/2015 1006   AST 69 (H) 08/22/2016 1020   AST 17 04/06/2015 1006   ALT 20 08/22/2016 1020   ALT 14 04/06/2015  1006   BILITOT 0.7 08/22/2016 1020   BILITOT 0.6 04/06/2015 1006       RADIOGRAPHIC STUDIES: I have personally reviewed the radiological images as listed and agreed with the findings in the report. Ct Chest W Contrast  Result Date: 08/29/2016 CLINICAL DATA:  Right breast cancer staging. Two week history of right sided rib pain. EXAM: CT CHEST, ABDOMEN, AND PELVIS WITH CONTRAST TECHNIQUE: Multidetector CT imaging of the chest, abdomen and pelvis was performed following the standard protocol during bolus administration of intravenous contrast. CONTRAST:  160m ISOVUE-300 IOPAMIDOL (ISOVUE-300) INJECTION 61% COMPARISON:  05/25/2016. FINDINGS: CT CHEST FINDINGS Cardiovascular: A heart size is normal. No pericardial effusion Atherosclerotic calcification is noted in the wall of the thoracic aorta. Mediastinum/Nodes: No mediastinal lymphadenopathy. There is no hilar lymphadenopathy. The esophagus has normal imaging features. No left axillary lymphadenopathy. There is new edema/ inflammation identified in the right axilla a finding not seen on the previous exam. Lungs/Pleura: 4 mm left upper lobe pulmonary nodule (image 64 series for) is unchanged in the interval. 3 mm perifissural nodule measured previously in the left lower lobe is 4 mm today, unchanged with slightly larger measurement due to CT slice acquisition. Tiny right pleural effusion is  new in the interval. Musculoskeletal: Diffuse widespread sclerotic disease consistent with metastatic bony involvement. CT ABDOMEN PELVIS FINDINGS Hepatobiliary: Interval development of numerous hepatic metastases, approximately 20 in total. The dominant lesion is towards the dome of the liver and measures 2.6 cm. Inferior right liver lesion measures 2.3 cm. Inferior lesion in the posterior right liver is 2.6 cm. Other lesions vary in size from 1-2 cm. There is no evidence for gallstones, gallbladder wall thickening, or pericholecystic fluid. No intrahepatic or extrahepatic biliary dilation. Pancreas: No focal mass lesion. No dilatation of the main duct. No intraparenchymal cyst. No peripancreatic edema. Spleen: No splenomegaly. No focal mass lesion. Adrenals/Urinary Tract: No adrenal nodule or mass. Stable appearance mild fullness both intrarenal collecting systems no enhancing lesion in either kidney. No evidence for hydroureter on either side. 3-4 mm enhancing nodule is identified along the urothelium of the anterior bladder wall (see image 101 of series 2 and sagittal image 97 of series 6). Stomach/Bowel: Stomach is nondistended. No gastric wall thickening. No evidence of outlet obstruction. Duodenum is normally positioned as is the ligament of Treitz. No small bowel wall thickening. No small bowel dilatation. The terminal ileum is normal. The tip of the appendix does not opacify and appears ill-defined although the mid and basilar portions of the appendix are well opacified and have normal imaging features. No gross colonic mass. No colonic wall thickening. Left colonic diverticulosis without diverticulitis. Vascular/Lymphatic: There is abdominal aortic atherosclerosis without aneurysm. There is no gastrohepatic or hepatoduodenal ligament lymphadenopathy. No intraperitoneal or retroperitoneal lymphadenopathy. No pelvic sidewall lymphadenopathy. Reproductive: Uterus is unremarkable.  There is no adnexal mass.  Other: No intraperitoneal free fluid. Musculoskeletal: Widespread sclerotic bony metastases are again noted. IMPRESSION: 1. Interval development of multiple hepatic metastases measuring up to 2.6 cm in diameter. 2. Interval development of edema/inflammation in the right axillary region, not present previously. 3. Interval development of small right pleural effusion. 4. Tip of the appendix is un opacified and measures normal diameter. There appears to be some trace periappendiceal edema or inflammation around the tip of the appendix. Correlation for signs/symptoms of acute appendicitis recommended. 5. 3-4 mm hyper enhancing lesion identified along the urothelium of the anterior bladder wall. Focal bladder wall neoplasm a concern. 6. Stable fullness bilateral intrarenal  collecting systems with no associated hydroureter. 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 7. Stable tiny left pulmonary nodules. These results will be called to the ordering clinician or representative by the Radiologist Assistant, and communication documented in the PACS or zVision Dashboard. Electronically Signed   By: Misty Stanley M.D.   On: 08/29/2016 12:54   Nm Bone Scan Whole Body  Result Date: 08/29/2016 CLINICAL DATA:  Malignant neoplasm of overlapping sites of the right breast. Metastatic breast cancer. EXAM: NUCLEAR MEDICINE WHOLE BODY BONE SCAN TECHNIQUE: Whole body anterior and posterior images were obtained approximately 3 hours after intravenous injection of radiopharmaceutical. RADIOPHARMACEUTICALS:  23.56 mCi Technetium-25mMDP IV COMPARISON:  Bone scan dated 05/22/2016 and CT scans dated 08/29/2016 FINDINGS: The patient has much more extensive metastatic disease throughout most of the skeleton involving all the ribs, the entire spine, pelvic bones, femurs, and skull. There are also lesions in the humeri. Progressive activity in the left acetabulum and left intertrochanteric region of the proximal left femur.  Progressed lesions in the sacrum. New distal sternal lesion. New bilateral hydronephrosis. IMPRESSION: 1. Marked progression diffuse osseous metastases throughout the skeleton. 2. New bilateral hydronephrosis. Electronically Signed   By: JLorriane ShireM.D.   On: 08/29/2016 15:34   Ct Abdomen Pelvis W Contrast  Result Date: 08/29/2016 CLINICAL DATA:  Right breast cancer staging. Two week history of right sided rib pain. EXAM: CT CHEST, ABDOMEN, AND PELVIS WITH CONTRAST TECHNIQUE: Multidetector CT imaging of the chest, abdomen and pelvis was performed following the standard protocol during bolus administration of intravenous contrast. CONTRAST:  1066mISOVUE-300 IOPAMIDOL (ISOVUE-300) INJECTION 61% COMPARISON:  05/25/2016. FINDINGS: CT CHEST FINDINGS Cardiovascular: A heart size is normal. No pericardial effusion Atherosclerotic calcification is noted in the wall of the thoracic aorta. Mediastinum/Nodes: No mediastinal lymphadenopathy. There is no hilar lymphadenopathy. The esophagus has normal imaging features. No left axillary lymphadenopathy. There is new edema/ inflammation identified in the right axilla a finding not seen on the previous exam. Lungs/Pleura: 4 mm left upper lobe pulmonary nodule (image 64 series for) is unchanged in the interval. 3 mm perifissural nodule measured previously in the left lower lobe is 4 mm today, unchanged with slightly larger measurement due to CT slice acquisition. Tiny right pleural effusion is new in the interval. Musculoskeletal: Diffuse widespread sclerotic disease consistent with metastatic bony involvement. CT ABDOMEN PELVIS FINDINGS Hepatobiliary: Interval development of numerous hepatic metastases, approximately 20 in total. The dominant lesion is towards the dome of the liver and measures 2.6 cm. Inferior right liver lesion measures 2.3 cm. Inferior lesion in the posterior right liver is 2.6 cm. Other lesions vary in size from 1-2 cm. There is no evidence for  gallstones, gallbladder wall thickening, or pericholecystic fluid. No intrahepatic or extrahepatic biliary dilation. Pancreas: No focal mass lesion. No dilatation of the main duct. No intraparenchymal cyst. No peripancreatic edema. Spleen: No splenomegaly. No focal mass lesion. Adrenals/Urinary Tract: No adrenal nodule or mass. Stable appearance mild fullness both intrarenal collecting systems no enhancing lesion in either kidney. No evidence for hydroureter on either side. 3-4 mm enhancing nodule is identified along the urothelium of the anterior bladder wall (see image 101 of series 2 and sagittal image 97 of series 6). Stomach/Bowel: Stomach is nondistended. No gastric wall thickening. No evidence of outlet obstruction. Duodenum is normally positioned as is the ligament of Treitz. No small bowel wall thickening. No small bowel dilatation. The terminal ileum is normal. The tip of the appendix does not opacify and  appears ill-defined although the mid and basilar portions of the appendix are well opacified and have normal imaging features. No gross colonic mass. No colonic wall thickening. Left colonic diverticulosis without diverticulitis. Vascular/Lymphatic: There is abdominal aortic atherosclerosis without aneurysm. There is no gastrohepatic or hepatoduodenal ligament lymphadenopathy. No intraperitoneal or retroperitoneal lymphadenopathy. No pelvic sidewall lymphadenopathy. Reproductive: Uterus is unremarkable.  There is no adnexal mass. Other: No intraperitoneal free fluid. Musculoskeletal: Widespread sclerotic bony metastases are again noted. IMPRESSION: 1. Interval development of multiple hepatic metastases measuring up to 2.6 cm in diameter. 2. Interval development of edema/inflammation in the right axillary region, not present previously. 3. Interval development of small right pleural effusion. 4. Tip of the appendix is un opacified and measures normal diameter. There appears to be some trace periappendiceal  edema or inflammation around the tip of the appendix. Correlation for signs/symptoms of acute appendicitis recommended. 5. 3-4 mm hyper enhancing lesion identified along the urothelium of the anterior bladder wall. Focal bladder wall neoplasm a concern. 6. Stable fullness bilateral intrarenal collecting systems with no associated hydroureter. 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 7. Stable tiny left pulmonary nodules. These results will be called to the ordering clinician or representative by the Radiologist Assistant, and communication documented in the PACS or zVision Dashboard. Electronically Signed   By: Misty Stanley M.D.   On: 08/29/2016 12:54     ASSESSMENT & PLAN:  Malignant neoplasm of overlapping sites of right breast [facility]) # Metastatic breast cancer ER/PR positive HER-2/neu negative- currently on Faslodex ibrance [last of dose approximately 1 week ago]. CT scan shows- progressive disease in the bones/multiple approximately 20 lesions in the liver. Bone scan shows progressive disease in the bones.   # Recommend- systemic chemotherapy- discussed the clinical trial taxol vs eribulin. Patient interested. Discussed the potential side effects including but not limited to neuropathy constipation and nausea vomiting; bone marrow suppression etc.  # Patient does not need a port now but likely future. Patient will likely need brain MR; no clinical symptoms of metastases to the brain  # Patient met with clinical trials nurse; start treatment next week- based on clinical trial participation/randomization. Patient will need labs prior to starting treatment.   #Follow-up with me on September 27th/treatment.   I reviewed the images myself and with the patient and family in detail. A copy of this report was given.   No orders of the defined types were placed in this encounter.  All questions were answered. The patient knows to call the clinic with any problems, questions or concerns.       Cammie Sickle, MD 08/31/2016 8:16 AM

## 2016-08-31 NOTE — Progress Notes (Signed)
START ON PATHWAY REGIMEN - Breast  BOS159: Paclitaxel 80 mg/m2 d1, d8, d15 q28 Days Until Progression or Unacceptable Toxicity   A cycle is every 28 days (3 weeks on and 1 week off):     Paclitaxel (Taxol(R)) 80 mg/m2 in 250 mL NS IV over 1 hour days 1, 8 and 15 (3 weeks on followed by 1 week off) Dose Mod: None  **Always confirm dose/schedule in your pharmacy ordering system**    Patient Characteristics: Metastatic Chemotherapy, HER2/neu Negative/Unknown/Equivocal, ER +, Second Line, No Prior Paclitaxel AJCC Stage Grouping: IV Current Disease Status: Distant Metastases AJCC M Stage: X ER Status: Positive (+) AJCC N Stage: X AJCC T Stage: X HER2/neu: Negative (-) PR Status: Positive (+) Line of therapy: Second Line Would you be surprised if this patient died  in the next year? I would be surprised if this patient died in the next year  Intent of Therapy: Non-Curative / Palliative Intent, Discussed with Patient

## 2016-08-31 NOTE — Assessment & Plan Note (Signed)
#  Metastatic breast cancer ER/PR positive HER-2/neu negative- currently on Faslodex ibrance [last of dose approximately 1 week ago]. CT scan shows- progressive disease in the bones/multiple approximately 20 lesions in the liver. Bone scan shows progressive disease in the bones.   # Recommend- systemic chemotherapy- discussed the clinical trial taxol vs eribulin. Patient interested. Discussed the potential side effects including but not limited to neuropathy constipation and nausea vomiting; bone marrow suppression etc.  # Patient does not need a port now but likely future. Patient will likely need brain MR; no clinical symptoms of metastases to the brain  # Patient met with clinical trials nurse; start treatment next week- based on clinical trial participation/randomization. Patient will need labs prior to starting treatment.   #Follow-up with me on September 27th/treatment.   I reviewed the images myself and with the patient and family in detail. A copy of this report was given.

## 2016-09-03 ENCOUNTER — Other Ambulatory Visit: Payer: Self-pay | Admitting: Internal Medicine

## 2016-09-03 ENCOUNTER — Telehealth: Payer: Self-pay | Admitting: *Deleted

## 2016-09-03 ENCOUNTER — Ambulatory Visit: Payer: BLUE CROSS/BLUE SHIELD | Admitting: Internal Medicine

## 2016-09-03 NOTE — Telephone Encounter (Signed)
  Oncology Nurse Navigator Documentation  Navigator Location: [facility] (09/03/16 1400) Navigator Encounter Type: Telephone (09/03/16 1400) Telephone: Incoming Call (09/03/16 1400)           Treatment Phase: Active Tx (09/03/16 1400) Barriers/Navigation Needs: Education (09/03/16 1400) Education: Understanding Cancer/ Treatment Options;Preparing for Upcoming Surgery/ Treatment (09/03/16 1400) Interventions: Education Method (09/03/16 1400)     Education Method: Verbal (09/03/16 1400)                Time Spent with Patient: 30 (09/03/16 1400)   Patient called with questions regarding Taxol and what to expect.  Reviewed side effects of Taxol.  Offered support.  She is to call if she has any questions or needs.

## 2016-09-05 ENCOUNTER — Other Ambulatory Visit: Payer: Self-pay | Admitting: Internal Medicine

## 2016-09-05 ENCOUNTER — Inpatient Hospital Stay: Payer: BLUE CROSS/BLUE SHIELD

## 2016-09-05 ENCOUNTER — Inpatient Hospital Stay (HOSPITAL_BASED_OUTPATIENT_CLINIC_OR_DEPARTMENT_OTHER): Payer: BLUE CROSS/BLUE SHIELD | Admitting: Internal Medicine

## 2016-09-05 ENCOUNTER — Other Ambulatory Visit: Payer: Self-pay | Admitting: Hematology and Oncology

## 2016-09-05 VITALS — BP 131/74 | HR 67 | Resp 18

## 2016-09-05 VITALS — BP 151/83 | HR 77 | Temp 97.1°F | Resp 18 | Wt 135.7 lb

## 2016-09-05 DIAGNOSIS — D61818 Other pancytopenia: Secondary | ICD-10-CM

## 2016-09-05 DIAGNOSIS — C7951 Secondary malignant neoplasm of bone: Secondary | ICD-10-CM

## 2016-09-05 DIAGNOSIS — C50811 Malignant neoplasm of overlapping sites of right female breast: Secondary | ICD-10-CM

## 2016-09-05 DIAGNOSIS — C787 Secondary malignant neoplasm of liver and intrahepatic bile duct: Secondary | ICD-10-CM

## 2016-09-05 DIAGNOSIS — R071 Chest pain on breathing: Secondary | ICD-10-CM | POA: Diagnosis not present

## 2016-09-05 DIAGNOSIS — R0789 Other chest pain: Secondary | ICD-10-CM

## 2016-09-05 MED ORDER — SODIUM CHLORIDE 0.9 % IV SOLN
10.0000 mg | Freq: Once | INTRAVENOUS | Status: AC
  Administered 2016-09-05: 10 mg via INTRAVENOUS
  Filled 2016-09-05: qty 1

## 2016-09-05 MED ORDER — DIPHENHYDRAMINE HCL 50 MG/ML IJ SOLN
25.0000 mg | Freq: Once | INTRAMUSCULAR | Status: AC
  Administered 2016-09-05: 25 mg via INTRAVENOUS
  Filled 2016-09-05: qty 1

## 2016-09-05 MED ORDER — INFLUENZA VAC SPLIT QUAD 0.5 ML IM SUSY: 0.5000 mL | PREFILLED_SYRINGE | Freq: Once | INTRAMUSCULAR | Status: DC

## 2016-09-05 MED ORDER — SODIUM CHLORIDE 0.9 % IV SOLN
Freq: Once | INTRAVENOUS | Status: AC
  Administered 2016-09-05: 11:00:00 via INTRAVENOUS
  Filled 2016-09-05: qty 1000

## 2016-09-05 MED ORDER — FAMOTIDINE IN NACL 20-0.9 MG/50ML-% IV SOLN
20.0000 mg | Freq: Once | INTRAVENOUS | Status: AC
  Administered 2016-09-05: 20 mg via INTRAVENOUS
  Filled 2016-09-05: qty 50

## 2016-09-05 MED ORDER — PROCHLORPERAZINE MALEATE 10 MG PO TABS: 10.0000 mg | ORAL_TABLET | Freq: Four times a day (QID) | ORAL | 1 refills | Status: DC | PRN

## 2016-09-05 MED ORDER — ONDANSETRON HCL 8 MG PO TABS: 8.0000 mg | ORAL_TABLET | Freq: Two times a day (BID) | ORAL | 1 refills | Status: DC | PRN

## 2016-09-05 MED ORDER — DEXTROSE 5 % IV SOLN
80.0000 mg/m2 | Freq: Once | INTRAVENOUS | Status: AC
  Administered 2016-09-05: 132 mg via INTRAVENOUS
  Filled 2016-09-05: qty 22

## 2016-09-05 NOTE — Progress Notes (Signed)
Patient refused to go to chemotherapy class. Patient states, "I talked to the doctor and told them I was not going to the class. The doctor is aware." Spoke with MD, Dr. Rogue Bussing. Per MD order: proceed with Taxol treatment today. Magdalene Patricia, RN is coming to infusion clinic to provide education on treatment/chemotherapy and obtain a consent prior to starting the Taxol treatment this morning.

## 2016-09-05 NOTE — Assessment & Plan Note (Signed)
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 She is relatively asymptomatic. I would defer to her primary oncologist to decide about the role for G-CSF in this situation.

## 2016-09-05 NOTE — Assessment & Plan Note (Signed)
She is relatively asymptomatic. She will continue Xgeva as directed along with calcium and vitamin D supplement

## 2016-09-05 NOTE — Assessment & Plan Note (Signed)
She has mild chest wall discomfort likely due to liver metastasis and possible nerve impingement affecting the thoracic nerve causing her symptoms. It appears that it is well controlled with over-the-counter analgesics with ibuprofen daily. I recommend we monitored this closely.

## 2016-09-05 NOTE — Progress Notes (Signed)
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 female was seen today on behalf of her oncologist. I reviewed the patient's records extensive and collaborated the history with the patient. Summary of her history is as follows: Oncology History   1carcinoma of breast biopsy done from the mass at 9:00 position (right breast) at [facility] on October 20, 2014.   T1 cN0 M1 stage IV disease [id number] biopsies positive for invasive mammary carcinoma with both Dr. lobular features.  Estrogen receptor +94%.  Progesterone receptor +95%.  HER-2/neu receptor -0. 2.  X-rays of the lumbar spine done in now September of 2015 revealed diffuse sclerotic bone lesion. 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  Liver is normal.  There are multiple sub-centimeter porta hepatis and pelvic and inguinal lymphadenopathy 4.  Recent has received 2 weeks of radiation therapy to the right hip at [facility] 5.  Has been started on letrozole 3 weeks ago (November 22, 2014) 6.recent has been switched to [facility] and letrozole 2 weeks ago starting from January 2016- (IBRANCE 125 mg by mouth for 3 weeks with one week off ) dose of IBRANCE has been reduced 100 mg because of neutropenia  # June 2017- CT Stable Bone mets;  Bone scan- slight increase in left femur uptake/ rising TM; MRI pelvis STABLE- cont Ibrance  # Increasing TM; July 19th 2017- START fasolodex + cont IBRANCE       Malignant neoplasm of overlapping sites of right breast [facility])   The patient had recent CT imaging in September which showed disease progression. Decision was made  to discontinue anti-estrogen therapy and proceed with chemotherapy with weekly paclitaxel  INTERVAL HISTORY: Please see below for problem oriented charting. She feels well. She had mild intermittent chest wall pain on the right side that comes and goes. It is well-controlled with ibuprofen as needed. She denies recent worsening bone pain all focal neurological deficits. She has mild anorexia and minimal weight loss.  MEDICAL HISTORY:  Past Medical History:  Diagnosis Date  . Breast cancer, right ([place]) 2015   rad tx's on right hip.  . Menopause 12/10/2006   2008    SURGICAL HISTORY: Past Surgical History:  Procedure Laterality Date  . CESAREAN SECTION      SOCIAL HISTORY: Social History   Social History  . Marital status: Married    Spouse name: N/A  . Number of children: 2  . Years of education: N/A   Occupational History  . Not on file.   Social History Main Topics  . Smoking status: Never Smoker  . Smokeless tobacco: Never Used  . Alcohol use No  . Drug use: No  . Sexual activity: Not on file   Other Topics Concern  . Not on file   Social History Narrative  . No narrative on file    FAMILY HISTORY: Family History  Problem Relation Age of Onset  . Cancer Father     prostate ca  . Cancer Sister     cancer unknown primary    ALLERGIES:  is allergic to beef extract; pork (porcine) protein; and beef-derived products.  MEDICATIONS:  Current Outpatient Prescriptions  Medication Sig Dispense Refill  . Ascorbic Acid (VITAMIN C) 100 MG tablet Take 100 mg by mouth daily.    . calcium-vitamin D (OSCAL WITH D) 500-200 MG-UNIT per tablet Take 1 tablet by mouth 3 (three) times daily.    . Multiple Vitamins-Minerals (MULTIVITAMIN WITH MINERALS) tablet Take 1 tablet by mouth daily.    Marland Kitchen levofloxacin (LEVAQUIN) 500 MG tablet Take 1 tablet (500 mg total) by mouth daily. (Patient not taking: Reported on 09/05/2016) 7 tablet 0  . ondansetron (ZOFRAN) 8 MG tablet Take 1  tablet (8 mg total) by mouth 2 (two) times daily as needed (Nausea or vomiting). 30 tablet 1  . palbociclib (IBRANCE) 100 MG capsule Take 1 capsule (100 mg total) by mouth daily. Take for 21 days then 7 days off. (Patient not taking: Reported on 09/05/2016) 21 capsule 11  . prochlorperazine (COMPAZINE) 10 MG tablet Take 1 tablet (10 mg total) by mouth every 6 (six) hours as needed (Nausea or vomiting). 30 tablet 1   No current facility-administered medications for this visit.     REVIEW OF SYSTEMS:   Constitutional: Denies fevers, chills or abnormal night sweats Eyes: Denies blurriness of vision, double vision or watery eyes Ears, nose, mouth, throat, and face: Denies mucositis or sore throat Respiratory: Denies cough, dyspnea or wheezes Cardiovascular: Denies palpitation, chest discomfort or lower extremity swelling Gastrointestinal:  Denies nausea, heartburn or change in bowel habits Skin: Denies abnormal skin rashes Lymphatics: Denies new lymphadenopathy or easy bruising Neurological:Denies numbness, tingling or new weaknesses Behavioral/Psych: Mood is stable, no new changes  All other systems were reviewed with the patient and are negative.  PHYSICAL EXAMINATION: ECOG PERFORMANCE STATUS: 1 - Symptomatic but completely ambulatory  Vitals:   09/05/16 0928  BP: (!) 151/83  Pulse: 77  Resp: 18  Temp: 97.1 F (36.2 C)   Filed Weights   09/05/16 0928  Weight: 135 lb 11.1 oz (61.5 kg)    GENERAL:alert, no distress and comfortable SKIN: skin color, texture, turgor are normal, no rashes or significant lesions EYES: normal, conjunctiva are pink and non-injected, sclera clear OROPHARYNX:no exudate, normal lips, buccal mucosa, and tongue  NECK: supple, thyroid normal size, non-tender, without nodularity LYMPH:  no palpable lymphadenopathy in the cervical, axillary or inguinal LUNGS: clear to auscultation and percussion with normal breathing effort HEART: regular rate & rhythm and no  murmurs without lower extremity edema ABDOMEN:abdomen soft, non-tender and normal bowel sounds Musculoskeletal:no cyanosis of digits and no clubbing  PSYCH: alert & oriented x 3 with fluent speech NEURO: no focal motor/sensory deficits  LABORATORY DATA:  I have reviewed the data as listed Lab Results  Component Value Date   WBC 1.9 (L) 08/31/2016   HGB 10.5 (L) 08/31/2016   HCT 29.8 (L) 08/31/2016   MCV 100.9 (H) 08/31/2016   PLT 189 08/31/2016    Recent Labs  07/25/16 0905 08/22/16 1020 08/31/16 0910  NA 137 138 137  K 4.2 3.8 3.8  CL 104 104 103  CO2 26 27 26   GLUCOSE 103* 112* 98  BUN 15 11 11   CREATININE 0.80 0.78 0.56  CALCIUM 8.9 9.1 9.0  GFRNONAA >60 >60 >60  GFRAA >60 >60 >60  PROT 7.6 7.2 7.5  ALBUMIN 4.6 4.5 4.4  AST 43* 69* 67*  ALT 17 20 23   ALKPHOS 84 116 133*  BILITOT 0.6 0.7 0.6    RADIOGRAPHIC STUDIES: I have personally reviewed the radiological images as listed and agreed with the  findings in the report. Ct Chest W Contrast  Result Date: 08/29/2016 CLINICAL DATA:  Right breast cancer staging. Two week history of right sided rib pain. EXAM: CT CHEST, ABDOMEN, AND PELVIS WITH CONTRAST TECHNIQUE: Multidetector CT imaging of the chest, abdomen and pelvis was performed following the standard protocol during bolus administration of intravenous contrast. CONTRAST:  151m ISOVUE-300 IOPAMIDOL (ISOVUE-300) INJECTION 61% COMPARISON:  05/25/2016. FINDINGS: CT CHEST FINDINGS Cardiovascular: A heart size is normal. No pericardial effusion Atherosclerotic calcification is noted in the wall of the thoracic aorta. Mediastinum/Nodes: No mediastinal lymphadenopathy. There is no hilar lymphadenopathy. The esophagus has normal imaging features. No left axillary lymphadenopathy. There is new edema/ inflammation identified in the right axilla a finding not seen on the previous exam. Lungs/Pleura: 4 mm left upper lobe pulmonary nodule (image 64 series for) is unchanged in the  interval. 3 mm perifissural nodule measured previously in the left lower lobe is 4 mm today, unchanged with slightly larger measurement due to CT slice acquisition. Tiny right pleural effusion is new in the interval. Musculoskeletal: Diffuse widespread sclerotic disease consistent with metastatic bony involvement. CT ABDOMEN PELVIS FINDINGS Hepatobiliary: Interval development of numerous hepatic metastases, approximately 20 in total. The dominant lesion is towards the dome of the liver and measures 2.6 cm. Inferior right liver lesion measures 2.3 cm. Inferior lesion in the posterior right liver is 2.6 cm. Other lesions vary in size from 1-2 cm. There is no evidence for gallstones, gallbladder wall thickening, or pericholecystic fluid. No intrahepatic or extrahepatic biliary dilation. Pancreas: No focal mass lesion. No dilatation of the main duct. No intraparenchymal cyst. No peripancreatic edema. Spleen: No splenomegaly. No focal mass lesion. Adrenals/Urinary Tract: No adrenal nodule or mass. Stable appearance mild fullness both intrarenal collecting systems no enhancing lesion in either kidney. No evidence for hydroureter on either side. 3-4 mm enhancing nodule is identified along the urothelium of the anterior bladder wall (see image 101 of series 2 and sagittal image 97 of series 6). Stomach/Bowel: Stomach is nondistended. No gastric wall thickening. No evidence of outlet obstruction. Duodenum is normally positioned as is the ligament of Treitz. No small bowel wall thickening. No small bowel dilatation. The terminal ileum is normal. The tip of the appendix does not opacify and appears ill-defined although the mid and basilar portions of the appendix are well opacified and have normal imaging features. No gross colonic mass. No colonic wall thickening. Left colonic diverticulosis without diverticulitis. Vascular/Lymphatic: There is abdominal aortic atherosclerosis without aneurysm. There is no gastrohepatic or  hepatoduodenal ligament lymphadenopathy. No intraperitoneal or retroperitoneal lymphadenopathy. No pelvic sidewall lymphadenopathy. Reproductive: Uterus is unremarkable.  There is no adnexal mass. Other: No intraperitoneal free fluid. Musculoskeletal: Widespread sclerotic bony metastases are again noted. IMPRESSION: 1. Interval development of multiple hepatic metastases measuring up to 2.6 cm in diameter. 2. Interval development of edema/inflammation in the right axillary region, not present previously. 3. Interval development of small right pleural effusion. 4. Tip of the appendix is un opacified and measures normal diameter. There appears to be some trace periappendiceal edema or inflammation around the tip of the appendix. Correlation for signs/symptoms of acute appendicitis recommended. 5. 3-4 mm hyper enhancing lesion identified along the urothelium of the anterior bladder wall. Focal bladder wall neoplasm a concern. 6. Stable fullness bilateral intrarenal collecting systems with no associated hydroureter. 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 7. Stable tiny left pulmonary nodules. These results will be called to the ordering clinician or representative by the Radiologist Assistant, and  communication documented in the PACS or zVision Dashboard. Electronically Signed   By: Misty Stanley M.D.   On: 08/29/2016 12:54   Nm Bone Scan Whole Body  Result Date: 08/29/2016 CLINICAL DATA:  Malignant neoplasm of overlapping sites of the right breast. Metastatic breast cancer. EXAM: NUCLEAR MEDICINE WHOLE BODY BONE SCAN TECHNIQUE: Whole body anterior and posterior images were obtained approximately 3 hours after intravenous injection of radiopharmaceutical. RADIOPHARMACEUTICALS:  23.56 mCi Technetium-65mMDP IV COMPARISON:  Bone scan dated 05/22/2016 and CT scans dated 08/29/2016 FINDINGS: The patient has much more extensive metastatic disease throughout most of the skeleton involving all the ribs, the  entire spine, pelvic bones, femurs, and skull. There are also lesions in the humeri. Progressive activity in the left acetabulum and left intertrochanteric region of the proximal left femur. Progressed lesions in the sacrum. New distal sternal lesion. New bilateral hydronephrosis. IMPRESSION: 1. Marked progression diffuse osseous metastases throughout the skeleton. 2. New bilateral hydronephrosis. Electronically Signed   By: JLorriane ShireM.D.   On: 08/29/2016 15:34   Ct Abdomen Pelvis W Contrast  Result Date: 08/29/2016 CLINICAL DATA:  Right breast cancer staging. Two week history of right sided rib pain. EXAM: CT CHEST, ABDOMEN, AND PELVIS WITH CONTRAST TECHNIQUE: Multidetector CT imaging of the chest, abdomen and pelvis was performed following the standard protocol during bolus administration of intravenous contrast. CONTRAST:  1073mISOVUE-300 IOPAMIDOL (ISOVUE-300) INJECTION 61% COMPARISON:  05/25/2016. FINDINGS: CT CHEST FINDINGS Cardiovascular: A heart size is normal. No pericardial effusion Atherosclerotic calcification is noted in the wall of the thoracic aorta. Mediastinum/Nodes: No mediastinal lymphadenopathy. There is no hilar lymphadenopathy. The esophagus has normal imaging features. No left axillary lymphadenopathy. There is new edema/ inflammation identified in the right axilla a finding not seen on the previous exam. Lungs/Pleura: 4 mm left upper lobe pulmonary nodule (image 64 series for) is unchanged in the interval. 3 mm perifissural nodule measured previously in the left lower lobe is 4 mm today, unchanged with slightly larger measurement due to CT slice acquisition. Tiny right pleural effusion is new in the interval. Musculoskeletal: Diffuse widespread sclerotic disease consistent with metastatic bony involvement. CT ABDOMEN PELVIS FINDINGS Hepatobiliary: Interval development of numerous hepatic metastases, approximately 20 in total. The dominant lesion is towards the dome of the liver and  measures 2.6 cm. Inferior right liver lesion measures 2.3 cm. Inferior lesion in the posterior right liver is 2.6 cm. Other lesions vary in size from 1-2 cm. There is no evidence for gallstones, gallbladder wall thickening, or pericholecystic fluid. No intrahepatic or extrahepatic biliary dilation. Pancreas: No focal mass lesion. No dilatation of the main duct. No intraparenchymal cyst. No peripancreatic edema. Spleen: No splenomegaly. No focal mass lesion. Adrenals/Urinary Tract: No adrenal nodule or mass. Stable appearance mild fullness both intrarenal collecting systems no enhancing lesion in either kidney. No evidence for hydroureter on either side. 3-4 mm enhancing nodule is identified along the urothelium of the anterior bladder wall (see image 101 of series 2 and sagittal image 97 of series 6). Stomach/Bowel: Stomach is nondistended. No gastric wall thickening. No evidence of outlet obstruction. Duodenum is normally positioned as is the ligament of Treitz. No small bowel wall thickening. No small bowel dilatation. The terminal ileum is normal. The tip of the appendix does not opacify and appears ill-defined although the mid and basilar portions of the appendix are well opacified and have normal imaging features. No gross colonic mass. No colonic wall thickening. Left colonic diverticulosis without diverticulitis. Vascular/Lymphatic: There  is abdominal aortic atherosclerosis without aneurysm. There is no gastrohepatic or hepatoduodenal ligament lymphadenopathy. No intraperitoneal or retroperitoneal lymphadenopathy. No pelvic sidewall lymphadenopathy. Reproductive: Uterus is unremarkable.  There is no adnexal mass. Other: No intraperitoneal free fluid. Musculoskeletal: Widespread sclerotic bony metastases are again noted. IMPRESSION: 1. Interval development of multiple hepatic metastases measuring up to 2.6 cm in diameter. 2. Interval development of edema/inflammation in the right axillary region, not present  previously. 3. Interval development of small right pleural effusion. 4. Tip of the appendix is un opacified and measures normal diameter. There appears to be some trace periappendiceal edema or inflammation around the tip of the appendix. Correlation for signs/symptoms of acute appendicitis recommended. 5. 3-4 mm hyper enhancing lesion identified along the urothelium of the anterior bladder wall. Focal bladder wall neoplasm a concern. 6. Stable fullness bilateral intrarenal collecting systems with no associated hydroureter. 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 7. Stable tiny left pulmonary nodules. These results will be called to the ordering clinician or representative by the Radiologist Assistant, and communication documented in the PACS or zVision Dashboard. Electronically Signed   By: Misty Stanley M.D.   On: 08/29/2016 12:54    ASSESSMENT & PLAN:  Malignant neoplasm of overlapping sites of right breast [facility]) The patient feels well today. She is ready to begin palliative chemotherapy. The original plan for treatment was fully discussed with her by her primary oncologist. The patient had disease progression with pancytopenia due to bony involvement We discussed the role of chemotherapy. The intent is for palliative.  We discussed some of the risks, benefits, side-effects of Taxol. Some of the short term side-effects included, though not limited to, risk of severe allergic reaction, fatigue, weight loss, pancytopenia, life-threatening infections, need for transfusions of blood products, nausea, vomiting, change in bowel habits, loss of hair, admission to hospital for various reasons, and risks of death.   Long term side-effects are also discussed including risks of infertility, permanent damage to nerve function, chronic fatigue, and rare secondary malignancy including bone marrow disorders.   The patient is aware that the response rates discussed earlier is not guaranteed.    After  a long discussion, patient made an informed decision to proceed with the prescribed plan of care  She will begin treatment today, on weeks 1, 8, 15 and rest day 22. Due to pancytopenia, I gave authorization to proceed despite low ANC. I will defer to her primary oncologist to decide whether he wants to prescribe Neulasta after day 15 of treatment  Metastasis to bone [facility]) She is relatively asymptomatic. She will continue Xgeva as directed along with calcium and vitamin D supplement  Pancytopenia, acquired ([place]) She has acquired pancytopenia with neutropenia and anemia related to bony metastasis. She is relatively asymptomatic. I would defer to her primary oncologist to decide about the role for G-CSF in this situation.  Chest wall discomfort She has mild chest wall discomfort likely due to liver metastasis and possible nerve impingement affecting the thoracic nerve causing her symptoms. It appears that it is well controlled with over-the-counter analgesics with ibuprofen daily. I recommend we monitored this closely.  Metastases to the liver [facility]) Recent CT demonstrated liver metastasis. She had mild intermittent discomfort on the right side of her abdomen which could be related to this. Her liver function appears adequate and will proceed with treatment without dose adjustment   No orders of the defined types were placed in this encounter.   All questions were answered. The patient knows to  call the clinic with any problems, questions or concerns. I spent 30 minutes counseling the patient face to face. The total time spent in the appointment was 40 minutes and more than 50% was on counseling.     Heath Lark, MD 09/05/2016 1:14 PM

## 2016-09-05 NOTE — Assessment & Plan Note (Signed)
Recent CT demonstrated liver metastasis. She had mild intermittent discomfort on the right side of her abdomen which could be related to this. Her liver function appears adequate and will proceed with treatment without dose adjustment

## 2016-09-05 NOTE — Assessment & Plan Note (Signed)
The patient feels well today. She is ready to begin palliative chemotherapy. The original plan for treatment was fully discussed with her by her primary oncologist. The patient had disease progression with pancytopenia due to bony involvement We discussed the role of chemotherapy. The intent is for palliative.  We discussed some of the risks, benefits, side-effects of Taxol. Some of the short term side-effects included, though not limited to, risk of severe allergic reaction, fatigue, weight loss, pancytopenia, life-threatening infections, need for transfusions of blood products, nausea, vomiting, change in bowel habits, loss of hair, admission to hospital for various reasons, and risks of death.   Long term side-effects are also discussed including risks of infertility, permanent damage to nerve function, chronic fatigue, and rare secondary malignancy including bone marrow disorders.   The patient is aware that the response rates discussed earlier is not guaranteed.    After a long discussion, patient made an informed decision to proceed with the prescribed plan of care  She will begin treatment today, on weeks 1, 8, 15 and rest day 22. Due to pancytopenia, I gave authorization to proceed despite low ANC. I will defer to her primary oncologist to decide whether he wants to prescribe Neulasta after day 15 of treatment

## 2016-09-05 NOTE — Patient Instructions (Signed)
Paclitaxel injection What is this medicine? PACLITAXEL (PAK li TAX el) is a chemotherapy drug. It targets fast dividing cells, like cancer cells, and causes these cells to die. This medicine is used to treat ovarian cancer, breast cancer, and other cancers. This medicine may be used for other purposes; ask your health care provider or pharmacist if you have questions. What should I tell my health care provider before I take this medicine? They need to know if you have any of these conditions: -blood disorders -irregular heartbeat -infection (especially a virus infection such as chickenpox, cold sores, or herpes) -liver disease -previous or ongoing radiation therapy -an unusual or allergic reaction to paclitaxel, alcohol, polyoxyethylated castor oil, other chemotherapy agents, other medicines, foods, dyes, or preservatives -pregnant or trying to get pregnant -breast-feeding How should I use this medicine? This drug is given as an infusion into a vein. It is administered in a hospital or clinic by a specially trained health care professional. Talk to your pediatrician regarding the use of this medicine in children. Special care may be needed. Overdosage: If you think you have taken too much of this medicine contact a poison control center or emergency room at once. NOTE: This medicine is only for you. Do not share this medicine with others. What if I miss a dose? It is important not to miss your dose. Call your doctor or health care professional if you are unable to keep an appointment. What may interact with this medicine? Do not take this medicine with any of the following medications: -disulfiram -metronidazole This medicine may also interact with the following medications: -cyclosporine -diazepam -ketoconazole -medicines to increase blood counts like filgrastim, pegfilgrastim, sargramostim -other chemotherapy drugs like cisplatin, doxorubicin, epirubicin, etoposide, teniposide,  vincristine -quinidine -testosterone -vaccines -verapamil Talk to your doctor or health care professional before taking any of these medicines: -acetaminophen -aspirin -ibuprofen -ketoprofen -naproxen This list may not describe all possible interactions. Give your health care provider a list of all the medicines, herbs, non-prescription drugs, or dietary supplements you use. Also tell them if you smoke, drink alcohol, or use illegal drugs. Some items may interact with your medicine. What should I watch for while using this medicine? Your condition will be monitored carefully while you are receiving this medicine. You will need important blood work done while you are taking this medicine. This drug may make you feel generally unwell. This is not uncommon, as chemotherapy can affect healthy cells as well as cancer cells. Report any side effects. Continue your course of treatment even though you feel ill unless your doctor tells you to stop. This medicine can cause serious allergic reactions. To reduce your risk you will need to take other medicine(s) before treatment with this medicine. In some cases, you may be given additional medicines to help with side effects. Follow all directions for their use. Call your doctor or health care professional for advice if you get a fever, chills or sore throat, or other symptoms of a cold or flu. Do not treat yourself. This drug decreases your body's ability to fight infections. Try to avoid being around people who are sick. This medicine may increase your risk to bruise or bleed. Call your doctor or health care professional if you notice any unusual bleeding. Be careful brushing and flossing your teeth or using a toothpick because you may get an infection or bleed more easily. If you have any dental work done, tell your dentist you are receiving this medicine. Avoid taking products that   contain aspirin, acetaminophen, ibuprofen, naproxen, or ketoprofen unless  instructed by your doctor. These medicines may hide a fever. Do not become pregnant while taking this medicine. Women should inform their doctor if they wish to become pregnant or think they might be pregnant. There is a potential for serious side effects to an unborn child. Talk to your health care professional or pharmacist for more information. Do not breast-feed an infant while taking this medicine. Men are advised not to father a child while receiving this medicine. This product may contain alcohol. Ask your pharmacist or healthcare provider if this medicine contains alcohol. Be sure to tell all healthcare providers you are taking this medicine. Certain medicines, like metronidazole and disulfiram, can cause an unpleasant reaction when taken with alcohol. The reaction includes flushing, headache, nausea, vomiting, sweating, and increased thirst. The reaction can last from 30 minutes to several hours. What side effects may I notice from receiving this medicine? Side effects that you should report to your doctor or health care professional as soon as possible: -allergic reactions like skin rash, itching or hives, swelling of the face, lips, or tongue -low blood counts - This drug may decrease the number of white blood cells, red blood cells and platelets. You may be at increased risk for infections and bleeding. -signs of infection - fever or chills, cough, sore throat, pain or difficulty passing urine -signs of decreased platelets or bleeding - bruising, pinpoint red spots on the skin, black, tarry stools, nosebleeds -signs of decreased red blood cells - unusually weak or tired, fainting spells, lightheadedness -breathing problems -chest pain -high or low blood pressure -mouth sores -nausea and vomiting -pain, swelling, redness or irritation at the injection site -pain, tingling, numbness in the hands or feet -slow or irregular heartbeat -swelling of the ankle, feet, hands Side effects that  usually do not require medical attention (report to your doctor or health care professional if they continue or are bothersome): -bone pain -complete hair loss including hair on your head, underarms, pubic hair, eyebrows, and eyelashes -changes in the color of fingernails -diarrhea -loosening of the fingernails -loss of appetite -muscle or joint pain -red flush to skin -sweating This list may not describe all possible side effects. Call your doctor for medical advice about side effects. You may report side effects to FDA at 1-800-FDA-1088. Where should I keep my medicine? This drug is given in a hospital or clinic and will not be stored at home. NOTE: This sheet is a summary. It may not cover all possible information. If you have questions about this medicine, talk to your doctor, pharmacist, or health care provider.    2016, Elsevier/Gold Standard. (2015-07-14 13:02:56)  

## 2016-09-06 ENCOUNTER — Other Ambulatory Visit: Payer: Self-pay | Admitting: *Deleted

## 2016-09-06 ENCOUNTER — Telehealth: Payer: Self-pay | Admitting: *Deleted

## 2016-09-06 DIAGNOSIS — B001 Herpesviral vesicular dermatitis: Secondary | ICD-10-CM

## 2016-09-06 MED ORDER — ACYCLOVIR 200 MG PO CAPS: 200.0000 mg | ORAL_CAPSULE | Freq: Two times a day (BID) | ORAL | 0 refills | Status: DC

## 2016-09-06 MED ORDER — ACYCLOVIR 400 MG PO TABS: 400.0000 mg | ORAL_TABLET | Freq: Two times a day (BID) | ORAL | 0 refills | Status: DC

## 2016-09-06 NOTE — Telephone Encounter (Signed)
Patient contacted Sheena in navigation this morning. Reports a fever blister. Normally takes acyclovir 400 mg 1 tablet twice daily for 7 days. Pt aware that rx that was sent to pharmacy.

## 2016-09-12 ENCOUNTER — Other Ambulatory Visit: Payer: Self-pay

## 2016-09-12 ENCOUNTER — Inpatient Hospital Stay: Payer: BLUE CROSS/BLUE SHIELD

## 2016-09-12 ENCOUNTER — Inpatient Hospital Stay: Payer: BLUE CROSS/BLUE SHIELD | Attending: Internal Medicine

## 2016-09-12 VITALS — BP 128/77 | HR 64 | Temp 96.7°F | Resp 18

## 2016-09-12 DIAGNOSIS — Z79899 Other long term (current) drug therapy: Secondary | ICD-10-CM | POA: Insufficient documentation

## 2016-09-12 DIAGNOSIS — C50811 Malignant neoplasm of overlapping sites of right female breast: Secondary | ICD-10-CM | POA: Diagnosis not present

## 2016-09-12 DIAGNOSIS — Z8042 Family history of malignant neoplasm of prostate: Secondary | ICD-10-CM | POA: Insufficient documentation

## 2016-09-12 DIAGNOSIS — C7951 Secondary malignant neoplasm of bone: Secondary | ICD-10-CM | POA: Insufficient documentation

## 2016-09-12 DIAGNOSIS — Z5111 Encounter for antineoplastic chemotherapy: Secondary | ICD-10-CM | POA: Diagnosis not present

## 2016-09-12 DIAGNOSIS — C787 Secondary malignant neoplasm of liver and intrahepatic bile duct: Secondary | ICD-10-CM | POA: Insufficient documentation

## 2016-09-12 DIAGNOSIS — R918 Other nonspecific abnormal finding of lung field: Secondary | ICD-10-CM | POA: Diagnosis not present

## 2016-09-12 DIAGNOSIS — R0789 Other chest pain: Secondary | ICD-10-CM | POA: Insufficient documentation

## 2016-09-12 DIAGNOSIS — Z923 Personal history of irradiation: Secondary | ICD-10-CM | POA: Diagnosis not present

## 2016-09-12 DIAGNOSIS — Z79811 Long term (current) use of aromatase inhibitors: Secondary | ICD-10-CM | POA: Diagnosis not present

## 2016-09-12 DIAGNOSIS — R0602 Shortness of breath: Secondary | ICD-10-CM | POA: Insufficient documentation

## 2016-09-12 DIAGNOSIS — Z809 Family history of malignant neoplasm, unspecified: Secondary | ICD-10-CM | POA: Diagnosis not present

## 2016-09-12 DIAGNOSIS — R591 Generalized enlarged lymph nodes: Secondary | ICD-10-CM | POA: Diagnosis not present

## 2016-09-12 DIAGNOSIS — Z170 Estrogen receptor positive status [ER+]: Secondary | ICD-10-CM | POA: Insufficient documentation

## 2016-09-12 LAB — CBC WITH DIFFERENTIAL/PLATELET
BASOS ABS: 0.1 10*3/uL (ref 0–0.1)
Basophils Relative: 2 %
EOS PCT: 2 %
Eosinophils Absolute: 0.1 10*3/uL (ref 0–0.7)
HEMATOCRIT: 29.4 % — AB (ref 35.0–47.0)
Hemoglobin: 10.4 g/dL — ABNORMAL LOW (ref 12.0–16.0)
LYMPHS PCT: 26 %
Lymphs Abs: 0.9 10*3/uL — ABNORMAL LOW (ref 1.0–3.6)
MCH: 35.1 pg — ABNORMAL HIGH (ref 26.0–34.0)
MCHC: 35.4 g/dL (ref 32.0–36.0)
MCV: 99.1 fL (ref 80.0–100.0)
Monocytes Absolute: 0.3 10*3/uL (ref 0.2–0.9)
Monocytes Relative: 9 %
NEUTROS ABS: 2.1 10*3/uL (ref 1.4–6.5)
NEUTROS PCT: 61 %
PLATELETS: 271 10*3/uL (ref 150–440)
RBC: 2.97 MIL/uL — AB (ref 3.80–5.20)
RDW: 16.7 % — ABNORMAL HIGH (ref 11.5–14.5)
WBC: 3.4 10*3/uL — AB (ref 3.6–11.0)

## 2016-09-12 MED ORDER — SODIUM CHLORIDE 0.9 % IV SOLN
10.0000 mg | Freq: Once | INTRAVENOUS | Status: AC
  Administered 2016-09-12: 10 mg via INTRAVENOUS
  Filled 2016-09-12: qty 1

## 2016-09-12 MED ORDER — SODIUM CHLORIDE 0.9 % IV SOLN
Freq: Once | INTRAVENOUS | Status: AC
  Administered 2016-09-12: 09:00:00 via INTRAVENOUS
  Filled 2016-09-12: qty 1000

## 2016-09-12 MED ORDER — PACLITAXEL CHEMO INJECTION 300 MG/50ML
80.0000 mg/m2 | Freq: Once | INTRAVENOUS | Status: AC
  Administered 2016-09-12: 132 mg via INTRAVENOUS
  Filled 2016-09-12: qty 22

## 2016-09-12 MED ORDER — DIPHENHYDRAMINE HCL 50 MG/ML IJ SOLN
25.0000 mg | Freq: Once | INTRAMUSCULAR | Status: AC
  Administered 2016-09-12: 25 mg via INTRAVENOUS
  Filled 2016-09-12: qty 1

## 2016-09-12 MED ORDER — FAMOTIDINE IN NACL 20-0.9 MG/50ML-% IV SOLN
20.0000 mg | Freq: Once | INTRAVENOUS | Status: AC
  Administered 2016-09-12: 20 mg via INTRAVENOUS
  Filled 2016-09-12: qty 50

## 2016-09-14 ENCOUNTER — Other Ambulatory Visit: Payer: Self-pay

## 2016-09-14 DIAGNOSIS — C50811 Malignant neoplasm of overlapping sites of right female breast: Secondary | ICD-10-CM

## 2016-09-19 ENCOUNTER — Inpatient Hospital Stay: Payer: BLUE CROSS/BLUE SHIELD

## 2016-09-19 ENCOUNTER — Inpatient Hospital Stay (HOSPITAL_BASED_OUTPATIENT_CLINIC_OR_DEPARTMENT_OTHER): Payer: BLUE CROSS/BLUE SHIELD | Admitting: Internal Medicine

## 2016-09-19 VITALS — BP 121/76 | HR 70 | Temp 97.6°F | Resp 18

## 2016-09-19 VITALS — BP 145/79 | HR 81 | Temp 97.6°F | Resp 18 | Ht 61.0 in | Wt 136.7 lb

## 2016-09-19 DIAGNOSIS — R918 Other nonspecific abnormal finding of lung field: Secondary | ICD-10-CM

## 2016-09-19 DIAGNOSIS — Z79811 Long term (current) use of aromatase inhibitors: Secondary | ICD-10-CM

## 2016-09-19 DIAGNOSIS — C7951 Secondary malignant neoplasm of bone: Secondary | ICD-10-CM | POA: Diagnosis not present

## 2016-09-19 DIAGNOSIS — C50811 Malignant neoplasm of overlapping sites of right female breast: Secondary | ICD-10-CM

## 2016-09-19 DIAGNOSIS — R591 Generalized enlarged lymph nodes: Secondary | ICD-10-CM

## 2016-09-19 DIAGNOSIS — Z809 Family history of malignant neoplasm, unspecified: Secondary | ICD-10-CM

## 2016-09-19 DIAGNOSIS — Z923 Personal history of irradiation: Secondary | ICD-10-CM

## 2016-09-19 DIAGNOSIS — Z170 Estrogen receptor positive status [ER+]: Secondary | ICD-10-CM | POA: Diagnosis not present

## 2016-09-19 DIAGNOSIS — R0789 Other chest pain: Secondary | ICD-10-CM

## 2016-09-19 DIAGNOSIS — Z79899 Other long term (current) drug therapy: Secondary | ICD-10-CM

## 2016-09-19 DIAGNOSIS — R0602 Shortness of breath: Secondary | ICD-10-CM

## 2016-09-19 DIAGNOSIS — Z8042 Family history of malignant neoplasm of prostate: Secondary | ICD-10-CM

## 2016-09-19 DIAGNOSIS — D61818 Other pancytopenia: Secondary | ICD-10-CM

## 2016-09-19 DIAGNOSIS — C787 Secondary malignant neoplasm of liver and intrahepatic bile duct: Secondary | ICD-10-CM | POA: Diagnosis not present

## 2016-09-19 LAB — COMPREHENSIVE METABOLIC PANEL
ALBUMIN: 4.1 g/dL (ref 3.5–5.0)
ALT: 45 U/L (ref 14–54)
ANION GAP: 8 (ref 5–15)
AST: 42 U/L — AB (ref 15–41)
Alkaline Phosphatase: 219 U/L — ABNORMAL HIGH (ref 38–126)
BILIRUBIN TOTAL: 0.5 mg/dL (ref 0.3–1.2)
BUN: 13 mg/dL (ref 6–20)
CHLORIDE: 103 mmol/L (ref 101–111)
CO2: 25 mmol/L (ref 22–32)
Calcium: 8.7 mg/dL — ABNORMAL LOW (ref 8.9–10.3)
Creatinine, Ser: 0.59 mg/dL (ref 0.44–1.00)
GFR calc Af Amer: >60 mL/min (ref >60)
GFR calc non Af Amer: >60 mL/min (ref >60)
GLUCOSE: 114 mg/dL — AB (ref 65–99)
POTASSIUM: 4.1 mmol/L (ref 3.5–5.1)
SODIUM: 136 mmol/L (ref 135–145)
TOTAL PROTEIN: 6.8 g/dL (ref 6.5–8.1)

## 2016-09-19 LAB — CBC WITH DIFFERENTIAL/PLATELET
BASOS ABS: 0.1 10*3/uL (ref 0–0.1)
BASOS PCT: 2 %
EOS ABS: 0.1 10*3/uL (ref 0–0.7)
EOS PCT: 3 %
HEMATOCRIT: 28 % — AB (ref 35.0–47.0)
Hemoglobin: 9.8 g/dL — ABNORMAL LOW (ref 12.0–16.0)
Lymphocytes Relative: 29 %
Lymphs Abs: 0.7 10*3/uL — ABNORMAL LOW (ref 1.0–3.6)
MCH: 34.4 pg — ABNORMAL HIGH (ref 26.0–34.0)
MCHC: 34.9 g/dL (ref 32.0–36.0)
MCV: 98.7 fL (ref 80.0–100.0)
MONO ABS: 0.2 10*3/uL (ref 0.2–0.9)
MONOS PCT: 10 %
NEUTROS ABS: 1.3 10*3/uL — AB (ref 1.4–6.5)
Neutrophils Relative %: 56 %
PLATELETS: 221 10*3/uL (ref 150–440)
RBC: 2.84 MIL/uL — ABNORMAL LOW (ref 3.80–5.20)
RDW: 16.9 % — AB (ref 11.5–14.5)
WBC: 2.4 10*3/uL — ABNORMAL LOW (ref 3.6–11.0)

## 2016-09-19 MED ORDER — SODIUM CHLORIDE 0.9 % IV SOLN
Freq: Once | INTRAVENOUS | Status: AC
  Administered 2016-09-19: 10:00:00 via INTRAVENOUS
  Filled 2016-09-19: qty 1000

## 2016-09-19 MED ORDER — DEXAMETHASONE SODIUM PHOSPHATE 10 MG/ML IJ SOLN
10.0000 mg | Freq: Once | INTRAMUSCULAR | Status: AC
  Administered 2016-09-19: 10 mg via INTRAVENOUS
  Filled 2016-09-19: qty 1

## 2016-09-19 MED ORDER — PACLITAXEL CHEMO INJECTION 300 MG/50ML
80.0000 mg/m2 | Freq: Once | INTRAVENOUS | Status: AC
  Administered 2016-09-19: 132 mg via INTRAVENOUS
  Filled 2016-09-19: qty 22

## 2016-09-19 MED ORDER — DIPHENHYDRAMINE HCL 50 MG/ML IJ SOLN
25.0000 mg | Freq: Once | INTRAMUSCULAR | Status: AC
  Administered 2016-09-19: 25 mg via INTRAVENOUS

## 2016-09-19 MED ORDER — FAMOTIDINE IN NACL 20-0.9 MG/50ML-% IV SOLN
20.0000 mg | Freq: Once | INTRAVENOUS | Status: AC
  Administered 2016-09-19: 20 mg via INTRAVENOUS
  Filled 2016-09-19: qty 50

## 2016-09-19 MED ORDER — HYDROCORTISONE NA SUCCINATE PF 100 MG IJ SOLR
100.0000 mg | Freq: Once | INTRAMUSCULAR | Status: AC
  Administered 2016-09-19: 100 mg via INTRAVENOUS

## 2016-09-19 MED ORDER — DENOSUMAB 120 MG/1.7ML ~~LOC~~ SOLN: 120.0000 mg | Freq: Once | SUBCUTANEOUS | Status: DC

## 2016-09-19 MED ORDER — DIPHENHYDRAMINE HCL 50 MG/ML IJ SOLN
25.0000 mg | Freq: Once | INTRAMUSCULAR | Status: AC
  Administered 2016-09-19: 25 mg via INTRAVENOUS
  Filled 2016-09-19: qty 1

## 2016-09-19 NOTE — Progress Notes (Signed)
11:05 - patient states chest heavy, feels like she is going to pass out.  Taxol stopped, vital signs checked (see flowsheet), called MD, gave Benadryl/Solucortef (see MAR); patient responsive.  Dr. B recommends fluids at 999 for thirty minutes and recheck with Dr. B for further instructions.  LJ  12:00 - patient's vitals are still good, spoke with Dr. B who said patient can go now.  Patient refused Delton See today, will get it on her visit next week. LJ

## 2016-09-19 NOTE — Progress Notes (Signed)
Patient here for next cycle of taxol. She has tolerated the taxol well. No signs of NVD. Denies any signs of neuropathy.

## 2016-09-19 NOTE — Assessment & Plan Note (Addendum)
#  Metastatic breast cancer ER/PR positive HER-2/neu negative- currently on second line therapy with Taxol weekly. Status is status post cycle #1 day 8 one week ago. Patient tolerating chemotherapy fairly well.  # Today white count is 2.1; absolute neutrophil count is 1.3; hemoglobin 9.8 platelets normal. Proceed with cycle #1 the 15 Taxol today.   # Multiple bony lesions- continue X-geva every 4 weeks; starting next week.  # Addendum: Patient had acute Taxol reaction while in chemotherapy today in spite of premedication. significant chest tightness/flushing/shortness of breath. Received steroids; Benadryl symptoms improved. We will discontinue Taxol. Start Abraxane next week. Discussed with the patient she agrees.  # Follow-up with me in 2 weeks with Abraxane cycle #1 day 8 CBC BMP.

## 2016-09-19 NOTE — Progress Notes (Signed)
[place] OFFICE PROGRESS NOTE  Patient Care Team: Gerilyn Pilgrim, FNP as PCP - General (Nurse Practitioner)  Cancer of breast [facility])   Staging form: Breast, AJCC 7th Edition     Clinical: Stage IV (T1b, N0, M1) - Signed by Forest Gleason, MD on 05/09/2015    Oncology History   1carcinoma of breast biopsy done from the mass at 9:00 position (right breast) at [facility] on October 20, 2014.   T1 cN0 M1 stage IV disease [id number] biopsies positive for invasive mammary carcinoma with both Dr. lobular features.  Estrogen receptor +94%.  Progesterone receptor +95%.  HER-2/neu receptor -0. 2.  X-rays of the lumbar spine done in now September of 2015 revealed diffuse sclerotic bone lesion. 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  Liver is normal.  There are multiple sub-centimeter porta hepatis and pelvic and inguinal lymphadenopathy 4.  Recent has received 2 weeks of radiation therapy to the right hip at [facility] 5.  Has been started on letrozole 3 weeks ago (November 22, 2014) 6.recent has been switched to [facility] and letrozole 2 weeks ago starting from January 2016- (IBRANCE 125 mg by mouth for 3 weeks with one week off ) dose of IBRANCE has been reduced 100 mg because of neutropenia  # June 2017- CT Stable Bone mets;  Bone scan- slight increase in left femur uptake/ rising TM; MRI pelvis STABLE- cont Ibrance  # Increasing TM; July 19th 2017- START fasolodex + cont IBRANCE  # SEP 2017-CT progression multiple liver mets/ bone mets - START Taxol [stopped sec to acute infusion reaction with 3rd treatment]  # OCT 18th 2017- ABRAXANE     Malignant neoplasm of overlapping sites of right breast ([place])    INTERVAL HISTORY:  Elizabeth Mccoy 60 y.o.  female pleasant patient above history of Metastatic breast cancer to bone currently on Second line  treatment with Taxol single agent is here for follow-up. Patient received Taxol cycle #1 and day 8 one week ago.  No fever no chills. Appetite is good. Her weight loss. No significant pain. Denies any nausea vomiting headaches. No fevers or chills. No skin rash. No diarrhea. No headaches or vision changes.  REVIEW OF SYSTEMS:  A complete 10 point review of system is done which is negative except mentioned above/history of present illness.   PAST MEDICAL HISTORY :  Past Medical History:  Diagnosis Date  . Breast cancer, right ([place]) 2015   rad tx's on right hip.  . Menopause 12/10/2006   2008    PAST SURGICAL HISTORY :   Past Surgical History:  Procedure Laterality Date  . CESAREAN SECTION      FAMILY HISTORY :   Family History  Problem Relation Age of Onset  . Cancer Father     prostate ca  . Cancer Sister     cancer unknown primary    SOCIAL HISTORY:   Social History  Substance Use Topics  . Smoking status: Never Smoker  . Smokeless tobacco: Never Used  . Alcohol use No    ALLERGIES:  is allergic to beef extract; pork (porcine) protein; and beef-derived products.  MEDICATIONS:  Current Outpatient Prescriptions  Medication Sig Dispense Refill  . calcium-vitamin D (OSCAL WITH D) 500-200 MG-UNIT per tablet Take 1 tablet by mouth 3 (three) times daily.    . Multiple Vitamins-Minerals (MULTIVITAMIN WITH MINERALS) tablet  Take 1 tablet by mouth daily.    Marland Kitchen acyclovir (ZOVIRAX) 400 MG tablet Take 1 tablet (400 mg total) by mouth 2 (two) times daily. (Patient not taking: Reported on 09/19/2016) 14 tablet 0   No current facility-administered medications for this visit.    Facility-Administered Medications Ordered in Other Visits  Medication Dose Route Frequency Provider Last Rate Last Dose  . denosumab (XGEVA) injection 120 mg  120 mg Subcutaneous Once Forest Gleason, MD        PHYSICAL EXAMINATION: ECOG PERFORMANCE STATUS: 0 - Asymptomatic  BP (!) 145/79   Pulse 81   Temp  97.6 F (36.4 C) (Tympanic)   Resp 18   Ht 5' 1"  (1.549 m)   Wt 136 lb 11 oz (62 kg)   LMP  (Approximate)   BMI 25.83 kg/m   Filed Weights   09/19/16 0917  Weight: 136 lb 11 oz (62 kg)    GENERAL: Well-nourished well-developed; Alert, no distress and comfortable.   Accompanied by her mother.  EYES: no pallor or icterus OROPHARYNX: no thrush or ulceration; good dentition  NECK: supple, no masses felt LYMPH:  no palpable lymphadenopathy in the cervical, axillary or inguinal regions LUNGS: clear to auscultation and  No wheeze or crackles HEART/CVS: regular rate & rhythm and no murmurs; No lower extremity edema ABDOMEN:abdomen soft, non-tender and normal bowel sounds Musculoskeletal:no cyanosis of digits and no clubbing  PSYCH: alert & oriented x 3 with fluent speech NEURO: no focal motor/sensory deficits SKIN:  no rashes or significant lesions  LABORATORY DATA:  I have reviewed the data as listed    Component Value Date/Time   NA 136 09/19/2016 0839   NA 139 04/06/2015 1006   K 4.1 09/19/2016 0839   K 4.0 04/06/2015 1006   CL 103 09/19/2016 0839   CL 105 04/06/2015 1006   CO2 25 09/19/2016 0839   CO2 28 04/06/2015 1006   GLUCOSE 114 (H) 09/19/2016 0839   GLUCOSE 90 04/06/2015 1006   BUN 13 09/19/2016 0839   BUN 20 04/06/2015 1006   CREATININE 0.59 09/19/2016 0839   CREATININE 0.78 04/06/2015 1006   CALCIUM 8.7 (L) 09/19/2016 0839   CALCIUM 9.5 04/06/2015 1006   PROT 6.8 09/19/2016 0839   PROT 7.8 04/06/2015 1006   ALBUMIN 4.1 09/19/2016 0839   ALBUMIN 4.7 04/06/2015 1006   AST 42 (H) 09/19/2016 0839   AST 17 04/06/2015 1006   ALT 45 09/19/2016 0839   ALT 14 04/06/2015 1006   ALKPHOS 219 (H) 09/19/2016 0839   ALKPHOS 62 04/06/2015 1006   BILITOT 0.5 09/19/2016 0839   BILITOT 0.6 04/06/2015 1006   GFRNONAA >60 09/19/2016 0839   GFRNONAA >60 04/06/2015 1006   GFRAA >60 09/19/2016 0839   GFRAA >60 04/06/2015 1006    No results found for: SPEP, UPEP  Lab  Results  Component Value Date   WBC 2.4 (L) 09/19/2016   NEUTROABS 1.3 (L) 09/19/2016   HGB 9.8 (L) 09/19/2016   HCT 28.0 (L) 09/19/2016   MCV 98.7 09/19/2016   PLT 221 09/19/2016      Chemistry      Component Value Date/Time   NA 136 09/19/2016 0839   NA 139 04/06/2015 1006   K 4.1 09/19/2016 0839   K 4.0 04/06/2015 1006   CL 103 09/19/2016 0839   CL 105 04/06/2015 1006   CO2 25 09/19/2016 0839   CO2 28 04/06/2015 1006   BUN 13 09/19/2016 0839   BUN 20 04/06/2015  1006   CREATININE 0.59 09/19/2016 0839   CREATININE 0.78 04/06/2015 1006      Component Value Date/Time   CALCIUM 8.7 (L) 09/19/2016 0839   CALCIUM 9.5 04/06/2015 1006   ALKPHOS 219 (H) 09/19/2016 0839   ALKPHOS 62 04/06/2015 1006   AST 42 (H) 09/19/2016 0839   AST 17 04/06/2015 1006   ALT 45 09/19/2016 0839   ALT 14 04/06/2015 1006   BILITOT 0.5 09/19/2016 0839   BILITOT 0.6 04/06/2015 1006       RADIOGRAPHIC STUDIES: I have personally reviewed the radiological images as listed and agreed with the findings in the report. No results found.   ASSESSMENT & PLAN:  Malignant neoplasm of overlapping sites of right breast [facility] # Metastatic breast cancer ER/PR positive HER-2/neu negative- currently on second line therapy with Taxol weekly. Status is status post cycle #1 day 8 one week ago. Patient tolerating chemotherapy fairly well.  # Today white count is 2.1; absolute neutrophil count is 1.3; hemoglobin 9.8 platelets normal. Proceed with cycle #1 the 15 Taxol today.   # Multiple bony lesions- continue X-geva every 4 weeks; starting next week.  # Addendum: Patient had acute Taxol reaction while in chemotherapy today in spite of premedication. significant chest tightness/flushing/shortness of breath. Received steroids; Benadryl symptoms improved. We will discontinue Taxol. Start Abraxane next week. Discussed with the patient she agrees.  # Follow-up with me in 2 weeks with Abraxane cycle #1 day 8 CBC BMP.    # 40 minutes face-to-face with the patient discussing the above plan of care; more than 50% of time spent on prognosis/ natural history; counseling and coordination.  Orders Placed This Encounter  Procedures  . CBC with Differential    Standing Status:   Future    Standing Expiration Date:   09/19/2017  . Comprehensive metabolic panel    Standing Status:   Future    Standing Expiration Date:   09/19/2017  . Cancer antigen 27.29    Standing Status:   Future    Standing Expiration Date:   09/19/2017   All questions were answered. The patient knows to call the clinic with any problems, questions or concerns.      Cammie Sickle, MD 09/19/2016 6:05 PM

## 2016-09-19 NOTE — Progress Notes (Signed)
DISCONTINUE ON PATHWAY REGIMEN - Breast  BOS159: Paclitaxel 80 mg/m2 d1, d8, d15 q28 Days Until Progression or Unacceptable Toxicity   A cycle is every 28 days (3 weeks on and 1 week off):     Paclitaxel (Taxol(R)) 80 mg/m2 in 250 mL NS IV over 1 hour days 1, 8 and 15 (3 weeks on followed by 1 week off) Dose Mod: None  **Always confirm dose/schedule in your pharmacy ordering system**    REASON: Toxicities / Adverse Event PRIOR TREATMENT: BOS159: Paclitaxel 80 mg/m2 d1, d8, d15 q28 Days Until Progression or Unacceptable Toxicity TREATMENT RESPONSE: Unable to Evaluate  START ON PATHWAY REGIMEN - Breast  BOS159: Paclitaxel 80 mg/m2 d1, d8, d15 q28 Days Until Progression or Unacceptable Toxicity   A cycle is every 28 days (3 weeks on and 1 week off):     Paclitaxel (Taxol(R)) 80 mg/m2 in 250 mL NS IV over 1 hour days 1, 8 and 15 (3 weeks on followed by 1 week off) Dose Mod: None  **Always confirm dose/schedule in your pharmacy ordering system**    Patient Characteristics: Metastatic Chemotherapy, HER2/neu Negative/Unknown/Equivocal, ER +, Second Line, No Prior Paclitaxel AJCC Stage Grouping: IV Current Disease Status: Distant Metastases AJCC M Stage: X ER Status: Positive (+) AJCC N Stage: X AJCC T Stage: X HER2/neu: Negative (-) PR Status: Positive (+) Line of therapy: Second Line Would you be surprised if this patient died  in the next year? I would be surprised if this patient died in the next year  Intent of Therapy: Non-Curative / Palliative Intent, Discussed with Patient

## 2016-09-20 LAB — CANCER ANTIGEN 27.29: CA 27.29: 982.9 U/mL — ABNORMAL HIGH (ref 0.0–38.6)

## 2016-09-21 ENCOUNTER — Telehealth: Payer: Self-pay

## 2016-09-21 NOTE — Telephone Encounter (Signed)
Called and gave pt  tumor marker results.  Pt wanted to know why or how long it will take to know if chemo is working and per Dr. B it can be a couple of months.  Pt verbalized an understanding.

## 2016-09-25 ENCOUNTER — Telehealth: Payer: Self-pay | Admitting: *Deleted

## 2016-09-25 NOTE — Telephone Encounter (Signed)
Patient called with questions regarding Abraxane.  Reviewed side effects and that no premeds of Bendryl or steroids were recommended.  Did explain that Dr. Rogue Bussing may decide she needs them anyway since she has a reaction to Taxol.  She is to call with any questions or needs.

## 2016-09-26 ENCOUNTER — Inpatient Hospital Stay: Payer: BLUE CROSS/BLUE SHIELD

## 2016-09-26 VITALS — BP 129/76 | HR 67 | Temp 97.9°F | Resp 20

## 2016-09-26 DIAGNOSIS — C50811 Malignant neoplasm of overlapping sites of right female breast: Secondary | ICD-10-CM | POA: Diagnosis not present

## 2016-09-26 DIAGNOSIS — C7951 Secondary malignant neoplasm of bone: Secondary | ICD-10-CM

## 2016-09-26 LAB — COMPREHENSIVE METABOLIC PANEL
ALT: 30 U/L (ref 14–54)
ANION GAP: 8 (ref 5–15)
AST: 51 U/L — AB (ref 15–41)
Albumin: 4.1 g/dL (ref 3.5–5.0)
Alkaline Phosphatase: 307 U/L — ABNORMAL HIGH (ref 38–126)
BILIRUBIN TOTAL: 0.7 mg/dL (ref 0.3–1.2)
BUN: 11 mg/dL (ref 6–20)
CHLORIDE: 103 mmol/L (ref 101–111)
CO2: 23 mmol/L (ref 22–32)
Calcium: 8.2 mg/dL — ABNORMAL LOW (ref 8.9–10.3)
Creatinine, Ser: 0.49 mg/dL (ref 0.44–1.00)
GFR calc Af Amer: >60 mL/min (ref >60)
Glucose, Bld: 111 mg/dL — ABNORMAL HIGH (ref 65–99)
POTASSIUM: 3.7 mmol/L (ref 3.5–5.1)
Sodium: 134 mmol/L — ABNORMAL LOW (ref 135–145)
TOTAL PROTEIN: 6.8 g/dL (ref 6.5–8.1)

## 2016-09-26 LAB — CBC WITH DIFFERENTIAL/PLATELET
Basophils Absolute: 0 10*3/uL (ref 0–0.1)
Basophils Relative: 1 %
EOS ABS: 0.1 10*3/uL (ref 0–0.7)
EOS PCT: 2 %
HCT: 29.5 % — ABNORMAL LOW (ref 35.0–47.0)
Hemoglobin: 10.1 g/dL — ABNORMAL LOW (ref 12.0–16.0)
LYMPHS ABS: 0.7 10*3/uL — AB (ref 1.0–3.6)
Lymphocytes Relative: 23 %
MCH: 33.3 pg (ref 26.0–34.0)
MCHC: 34.2 g/dL (ref 32.0–36.0)
MCV: 97.3 fL (ref 80.0–100.0)
MONO ABS: 0.4 10*3/uL (ref 0.2–0.9)
MONOS PCT: 14 %
Neutro Abs: 2 10*3/uL (ref 1.4–6.5)
Neutrophils Relative %: 60 %
PLATELETS: 202 10*3/uL (ref 150–440)
RBC: 3.03 MIL/uL — ABNORMAL LOW (ref 3.80–5.20)
RDW: 17.7 % — AB (ref 11.5–14.5)
WBC: 3.2 10*3/uL — AB (ref 3.6–11.0)

## 2016-09-26 MED ORDER — PROCHLORPERAZINE MALEATE 10 MG PO TABS
10.0000 mg | ORAL_TABLET | Freq: Once | ORAL | Status: AC
  Administered 2016-09-26: 10 mg via ORAL
  Filled 2016-09-26: qty 1

## 2016-09-26 MED ORDER — PACLITAXEL PROTEIN-BOUND CHEMO INJECTION 100 MG
100.0000 mg/m2 | Freq: Once | INTRAVENOUS | Status: AC
  Administered 2016-09-26: 175 mg via INTRAVENOUS
  Filled 2016-09-26: qty 35

## 2016-09-26 MED ORDER — SODIUM CHLORIDE 0.9 % IV SOLN
Freq: Once | INTRAVENOUS | Status: AC
  Administered 2016-09-26: 11:00:00 via INTRAVENOUS
  Filled 2016-09-26: qty 1000

## 2016-09-27 LAB — CANCER ANTIGEN 27.29: CA 27.29: 955.9 U/mL — ABNORMAL HIGH (ref 0.0–38.6)

## 2016-10-02 ENCOUNTER — Other Ambulatory Visit: Payer: Self-pay | Admitting: *Deleted

## 2016-10-02 ENCOUNTER — Telehealth: Payer: Self-pay | Admitting: *Deleted

## 2016-10-02 DIAGNOSIS — C50811 Malignant neoplasm of overlapping sites of right female breast: Secondary | ICD-10-CM

## 2016-10-02 NOTE — Telephone Encounter (Signed)
Patient called today.  States she wants to get a port a cath.  States she doesn't want to be stuck some many times.  She prefers Dr. Rochel Brome.

## 2016-10-03 ENCOUNTER — Inpatient Hospital Stay: Payer: BLUE CROSS/BLUE SHIELD

## 2016-10-03 ENCOUNTER — Ambulatory Visit: Payer: BLUE CROSS/BLUE SHIELD | Admitting: Internal Medicine

## 2016-10-03 VITALS — BP 136/77 | HR 68 | Temp 96.0°F | Resp 18

## 2016-10-03 DIAGNOSIS — C50811 Malignant neoplasm of overlapping sites of right female breast: Secondary | ICD-10-CM

## 2016-10-03 DIAGNOSIS — C7951 Secondary malignant neoplasm of bone: Secondary | ICD-10-CM

## 2016-10-03 LAB — CBC WITH DIFFERENTIAL/PLATELET
BASOS PCT: 2 %
Basophils Absolute: 0.1 10*3/uL (ref 0–0.1)
EOS ABS: 0.1 10*3/uL (ref 0–0.7)
Eosinophils Relative: 2 %
HCT: 29.6 % — ABNORMAL LOW (ref 35.0–47.0)
Hemoglobin: 10.2 g/dL — ABNORMAL LOW (ref 12.0–16.0)
LYMPHS ABS: 0.9 10*3/uL — AB (ref 1.0–3.6)
Lymphocytes Relative: 24 %
MCH: 33.1 pg (ref 26.0–34.0)
MCHC: 34.3 g/dL (ref 32.0–36.0)
MCV: 96.6 fL (ref 80.0–100.0)
MONO ABS: 0.2 10*3/uL (ref 0.2–0.9)
MONOS PCT: 7 %
Neutro Abs: 2.4 10*3/uL (ref 1.4–6.5)
Neutrophils Relative %: 65 %
Platelets: 225 10*3/uL (ref 150–440)
RBC: 3.07 MIL/uL — ABNORMAL LOW (ref 3.80–5.20)
RDW: 17.9 % — AB (ref 11.5–14.5)
WBC: 3.7 10*3/uL (ref 3.6–11.0)

## 2016-10-03 LAB — COMPREHENSIVE METABOLIC PANEL
ALBUMIN: 4.2 g/dL (ref 3.5–5.0)
ALK PHOS: 296 U/L — AB (ref 38–126)
ALT: 32 U/L (ref 14–54)
ANION GAP: 9 (ref 5–15)
AST: 46 U/L — ABNORMAL HIGH (ref 15–41)
BILIRUBIN TOTAL: 0.3 mg/dL (ref 0.3–1.2)
BUN: 14 mg/dL (ref 6–20)
CALCIUM: 8.9 mg/dL (ref 8.9–10.3)
CO2: 25 mmol/L (ref 22–32)
Chloride: 100 mmol/L — ABNORMAL LOW (ref 101–111)
Creatinine, Ser: 0.52 mg/dL (ref 0.44–1.00)
GFR calc non Af Amer: >60 mL/min (ref >60)
GLUCOSE: 123 mg/dL — AB (ref 65–99)
POTASSIUM: 4.4 mmol/L (ref 3.5–5.1)
SODIUM: 134 mmol/L — AB (ref 135–145)
TOTAL PROTEIN: 6.9 g/dL (ref 6.5–8.1)

## 2016-10-03 MED ORDER — PACLITAXEL PROTEIN-BOUND CHEMO INJECTION 100 MG
100.0000 mg/m2 | Freq: Once | INTRAVENOUS | Status: AC
  Administered 2016-10-03: 175 mg via INTRAVENOUS
  Filled 2016-10-03: qty 35

## 2016-10-03 MED ORDER — SODIUM CHLORIDE 0.9 % IV SOLN
Freq: Once | INTRAVENOUS | Status: AC
  Administered 2016-10-03: 09:00:00 via INTRAVENOUS
  Filled 2016-10-03: qty 1000

## 2016-10-03 MED ORDER — PROCHLORPERAZINE MALEATE 10 MG PO TABS
10.0000 mg | ORAL_TABLET | Freq: Once | ORAL | Status: AC
  Administered 2016-10-03: 10 mg via ORAL
  Filled 2016-10-03: qty 1

## 2016-10-04 ENCOUNTER — Other Ambulatory Visit: Payer: Self-pay

## 2016-10-04 DIAGNOSIS — C50811 Malignant neoplasm of overlapping sites of right female breast: Secondary | ICD-10-CM

## 2016-10-08 ENCOUNTER — Telehealth: Payer: Self-pay | Admitting: *Deleted

## 2016-10-08 NOTE — Telephone Encounter (Signed)
Patient called today.  States she "just can't eat".  Wanted to know if Dr. B could give her something to help her appetite.  States she passed out Sunday after her hot shower.  States no injuries.  States she drank 2 bottle of water yesterday, and wonders if she may be dehydrated.  Wanted to know when her next appointment is?  States she got a phone call saying it was Thursday and she thought it was Wednesday.  Confirmed her appointment is on Wed at 1:30.  Informed her I would let Dr. B know that she passed out.  States she does not want to come in early, that she is feeling better today.  Encouraged her to increase her fluid intake and to call if feels worse or passes out again.  Informed I would notify Dr. B and if he wants to see her earlier than Wed his staff will give her a call.

## 2016-10-09 ENCOUNTER — Other Ambulatory Visit: Payer: Self-pay | Admitting: *Deleted

## 2016-10-09 ENCOUNTER — Telehealth: Payer: Self-pay | Admitting: *Deleted

## 2016-10-09 DIAGNOSIS — C50811 Malignant neoplasm of overlapping sites of right female breast: Secondary | ICD-10-CM

## 2016-10-09 NOTE — Telephone Encounter (Signed)
Patient returned call states she is feeling much better. 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 Jacinto Reap tomorrow. She also stated that she no longer wants to pursue port placement. If she needs additional treatment she will re-visit placement.

## 2016-10-09 NOTE — Telephone Encounter (Signed)
Called left message for patient to contact us at the [facility] today if she was still feeling ill.

## 2016-10-10 ENCOUNTER — Inpatient Hospital Stay: Payer: BLUE CROSS/BLUE SHIELD | Attending: Internal Medicine

## 2016-10-10 ENCOUNTER — Inpatient Hospital Stay: Payer: BLUE CROSS/BLUE SHIELD

## 2016-10-10 ENCOUNTER — Inpatient Hospital Stay (HOSPITAL_BASED_OUTPATIENT_CLINIC_OR_DEPARTMENT_OTHER): Payer: BLUE CROSS/BLUE SHIELD | Admitting: Internal Medicine

## 2016-10-10 VITALS — BP 119/83 | HR 96 | Temp 96.4°F | Resp 18 | Wt 122.4 lb

## 2016-10-10 DIAGNOSIS — Z170 Estrogen receptor positive status [ER+]: Secondary | ICD-10-CM | POA: Insufficient documentation

## 2016-10-10 DIAGNOSIS — M25552 Pain in left hip: Secondary | ICD-10-CM | POA: Diagnosis not present

## 2016-10-10 DIAGNOSIS — Z79811 Long term (current) use of aromatase inhibitors: Secondary | ICD-10-CM

## 2016-10-10 DIAGNOSIS — Z8042 Family history of malignant neoplasm of prostate: Secondary | ICD-10-CM

## 2016-10-10 DIAGNOSIS — R202 Paresthesia of skin: Secondary | ICD-10-CM

## 2016-10-10 DIAGNOSIS — R591 Generalized enlarged lymph nodes: Secondary | ICD-10-CM | POA: Diagnosis not present

## 2016-10-10 DIAGNOSIS — Z5181 Encounter for therapeutic drug level monitoring: Secondary | ICD-10-CM

## 2016-10-10 DIAGNOSIS — C7931 Secondary malignant neoplasm of brain: Secondary | ICD-10-CM | POA: Diagnosis not present

## 2016-10-10 DIAGNOSIS — C787 Secondary malignant neoplasm of liver and intrahepatic bile duct: Secondary | ICD-10-CM | POA: Insufficient documentation

## 2016-10-10 DIAGNOSIS — Z809 Family history of malignant neoplasm, unspecified: Secondary | ICD-10-CM

## 2016-10-10 DIAGNOSIS — I700 Atherosclerosis of aorta: Secondary | ICD-10-CM | POA: Diagnosis not present

## 2016-10-10 DIAGNOSIS — Z8673 Personal history of transient ischemic attack (TIA), and cerebral infarction without residual deficits: Secondary | ICD-10-CM | POA: Insufficient documentation

## 2016-10-10 DIAGNOSIS — R55 Syncope and collapse: Secondary | ICD-10-CM | POA: Insufficient documentation

## 2016-10-10 DIAGNOSIS — R200 Anesthesia of skin: Secondary | ICD-10-CM

## 2016-10-10 DIAGNOSIS — Z7689 Persons encountering health services in other specified circumstances: Secondary | ICD-10-CM | POA: Insufficient documentation

## 2016-10-10 DIAGNOSIS — Z79899 Other long term (current) drug therapy: Secondary | ICD-10-CM

## 2016-10-10 DIAGNOSIS — Z9221 Personal history of antineoplastic chemotherapy: Secondary | ICD-10-CM | POA: Diagnosis not present

## 2016-10-10 DIAGNOSIS — R188 Other ascites: Secondary | ICD-10-CM | POA: Insufficient documentation

## 2016-10-10 DIAGNOSIS — C50811 Malignant neoplasm of overlapping sites of right female breast: Secondary | ICD-10-CM | POA: Insufficient documentation

## 2016-10-10 DIAGNOSIS — C7951 Secondary malignant neoplasm of bone: Secondary | ICD-10-CM

## 2016-10-10 DIAGNOSIS — Z923 Personal history of irradiation: Secondary | ICD-10-CM | POA: Diagnosis not present

## 2016-10-10 DIAGNOSIS — D701 Agranulocytosis secondary to cancer chemotherapy: Secondary | ICD-10-CM | POA: Diagnosis not present

## 2016-10-10 DIAGNOSIS — J90 Pleural effusion, not elsewhere classified: Secondary | ICD-10-CM | POA: Insufficient documentation

## 2016-10-10 DIAGNOSIS — E860 Dehydration: Secondary | ICD-10-CM | POA: Insufficient documentation

## 2016-10-10 DIAGNOSIS — R634 Abnormal weight loss: Secondary | ICD-10-CM | POA: Insufficient documentation

## 2016-10-10 DIAGNOSIS — G9389 Other specified disorders of brain: Secondary | ICD-10-CM | POA: Insufficient documentation

## 2016-10-10 DIAGNOSIS — R630 Anorexia: Secondary | ICD-10-CM | POA: Insufficient documentation

## 2016-10-10 LAB — CBC WITH DIFFERENTIAL/PLATELET
BASOS ABS: 0 10*3/uL (ref 0–0.1)
BASOS PCT: 1 %
Eosinophils Absolute: 0 10*3/uL (ref 0–0.7)
Eosinophils Relative: 2 %
HEMATOCRIT: 30.9 % — AB (ref 35.0–47.0)
HEMOGLOBIN: 10.6 g/dL — AB (ref 12.0–16.0)
Lymphocytes Relative: 38 %
Lymphs Abs: 0.7 10*3/uL — ABNORMAL LOW (ref 1.0–3.6)
MCH: 32.5 pg (ref 26.0–34.0)
MCHC: 34.2 g/dL (ref 32.0–36.0)
MCV: 95.1 fL (ref 80.0–100.0)
MONOS PCT: 11 %
Monocytes Absolute: 0.2 10*3/uL (ref 0.2–0.9)
NEUTROS ABS: 0.8 10*3/uL — AB (ref 1.4–6.5)
NEUTROS PCT: 48 %
Platelets: 247 10*3/uL (ref 150–440)
RBC: 3.25 MIL/uL — ABNORMAL LOW (ref 3.80–5.20)
RDW: 17.8 % — ABNORMAL HIGH (ref 11.5–14.5)
WBC: 1.8 10*3/uL — ABNORMAL LOW (ref 3.6–11.0)

## 2016-10-10 LAB — COMPREHENSIVE METABOLIC PANEL
ALBUMIN: 4.4 g/dL (ref 3.5–5.0)
ALK PHOS: 262 U/L — AB (ref 38–126)
ALT: 29 U/L (ref 14–54)
AST: 38 U/L (ref 15–41)
Anion gap: 8 (ref 5–15)
BILIRUBIN TOTAL: 0.6 mg/dL (ref 0.3–1.2)
BUN: 13 mg/dL (ref 6–20)
CALCIUM: 8.8 mg/dL — AB (ref 8.9–10.3)
CO2: 24 mmol/L (ref 22–32)
CREATININE: 0.56 mg/dL (ref 0.44–1.00)
Chloride: 100 mmol/L — ABNORMAL LOW (ref 101–111)
GFR calc Af Amer: >60 mL/min (ref >60)
GFR calc non Af Amer: >60 mL/min (ref >60)
GLUCOSE: 111 mg/dL — AB (ref 65–99)
Potassium: 3.7 mmol/L (ref 3.5–5.1)
Sodium: 132 mmol/L — ABNORMAL LOW (ref 135–145)
TOTAL PROTEIN: 7 g/dL (ref 6.5–8.1)

## 2016-10-10 MED ORDER — DRONABINOL 5 MG PO CAPS: 5.0000 mg | ORAL_CAPSULE | Freq: Two times a day (BID) | ORAL | 1 refills | Status: DC

## 2016-10-10 MED ORDER — SODIUM CHLORIDE 0.9 % IV SOLN
Freq: Once | INTRAVENOUS | Status: AC
  Administered 2016-10-10: 15:00:00 via INTRAVENOUS
  Filled 2016-10-10: qty 1000

## 2016-10-10 NOTE — Progress Notes (Signed)
DISCONTINUE ON PATHWAY REGIMEN - Breast  No Medical Intervention - Off Treatment.  REASON: Disease Progression PRIOR TREATMENT: Off Treatment  Breast - No Medical Intervention - Off Treatment.  Patient Characteristics: Metastatic Chemotherapy, HER2/neu Negative/Unknown/Equivocal, ER +, Second Line, Prior Paclitaxel AJCC Stage Grouping: IV Current Disease Status: Distant Metastases AJCC M Stage: X ER Status: Positive (+) AJCC N Stage: X AJCC T Stage: X HER2/neu: Negative (-) PR Status: Positive (+) Line of therapy: Second Line Would you be surprised if this patient died  in the next year? I would be surprised if this patient died in the next year

## 2016-10-10 NOTE — Progress Notes (Signed)
Patient is here for follow up, she says she feels really bad. She does not want to drink or eat, she has been feeling like this for two weeks. Denies and nausea and vomiting. 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    She would like to ask about Megace

## 2016-10-10 NOTE — Progress Notes (Signed)
[place] OFFICE PROGRESS NOTE  Patient Care Team: Gerilyn Pilgrim, FNP as PCP - General (Nurse Practitioner)  Cancer of breast [facility])   Staging form: Breast, AJCC 7th Edition     Clinical: Stage IV (T1b, N0, M1) - Signed by Forest Gleason, MD on 05/09/2015    Oncology History   1carcinoma of breast biopsy done from the mass at 9:00 position (right breast) at [facility] on October 20, 2014.   T1 cN0 M1 stage IV disease [id number] biopsies positive for invasive mammary carcinoma with both Dr. lobular features.  Estrogen receptor +94%.  Progesterone receptor +95%.  HER-2/neu receptor -0. 2.  X-rays of the lumbar spine done in now September of 2015 revealed diffuse sclerotic bone lesion. 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  Liver is normal.  There are multiple sub-centimeter porta hepatis and pelvic and inguinal lymphadenopathy 4.  Recent has received 2 weeks of radiation therapy to the right hip at [facility] 5.  Has been started on letrozole 3 weeks ago (November 22, 2014) 6.recent has been switched to [facility] and letrozole 2 weeks ago starting from January 2016- (IBRANCE 125 mg by mouth for 3 weeks with one week off ) dose of IBRANCE has been reduced 100 mg because of neutropenia  # June 2017- CT Stable Bone mets;  Bone scan- slight increase in left femur uptake/ rising TM; MRI pelvis STABLE- cont Ibrance  # Increasing TM; July 19th 2017- START fasolodex + cont IBRANCE  # SEP 2017-CT progression multiple liver mets/ bone mets - START Taxol [stopped sec to acute infusion reaction with 3rd treatment]  # OCT 18th 2017- ABRAXANE     Malignant neoplasm of overlapping sites of right breast [facility])    Carcinoma of overlapping sites of right breast in female, estrogen receptor positive ([place])   10/10/2016 Initial Diagnosis    Carcinoma of overlapping sites  of right breast in female, estrogen receptor positive ([place])      INTERVAL HISTORY:  Elizabeth Mccoy 60 y.o.  female pleasant patient above history of Metastatic breast cancer to bone currently Abraxane status post cycle #2 day 8 is here for follow-up.  Patient complains of poor appetite. Complains of pain in her left hip. Also complains of tingling and numbness in her right thumb and left leg. Complains of heaviness. She had episode of fall/syncope at home while taking a shower. Denies any trauma to the head. Denies any seizure-like episodes. Appetite is poor. Lost weight. No nausea vomiting. Patient has been taking Tylenol for her left hip pain. She has not wanted to narcotics.    REVIEW OF SYSTEMS:  A complete 10 point review of system is done which is negative except mentioned above/history of present illness.   PAST MEDICAL HISTORY :  Past Medical History:  Diagnosis Date  . Breast cancer, right ([place]) 2015   rad tx's on right hip.  . Menopause 12/10/2006   2008    PAST SURGICAL HISTORY :   Past Surgical History:  Procedure Laterality Date  . CESAREAN SECTION      FAMILY HISTORY :   Family History  Problem Relation Age of Onset  . Cancer Father     prostate ca  . Cancer Sister     cancer unknown primary    SOCIAL HISTORY:   Social History  Substance Use Topics  . Smoking status:  Never Smoker  . Smokeless tobacco: Never Used  . Alcohol use No    ALLERGIES:  is allergic to beef extract; pork (porcine) protein; and beef-derived products.  MEDICATIONS:  Current Outpatient Prescriptions  Medication Sig Dispense Refill  . calcium-vitamin D (OSCAL WITH D) 500-200 MG-UNIT per tablet Take 1 tablet by mouth 3 (three) times daily.    . Multiple Vitamins-Minerals (MULTIVITAMIN WITH MINERALS) tablet Take 1 tablet by mouth daily.    Marland Kitchen Prochlorperazine Maleate (COMPAZINE PO) Take by mouth.    Marland Kitchen acyclovir (ZOVIRAX) 400 MG tablet Take 1 tablet (400 mg total) by mouth 2  (two) times daily. (Patient not taking: Reported on 10/10/2016) 14 tablet 0  . dronabinol (MARINOL) 5 MG capsule Take 1 capsule (5 mg total) by mouth 2 (two) times daily before a meal. 60 capsule 1   No current facility-administered medications for this visit.     PHYSICAL EXAMINATION: ECOG PERFORMANCE STATUS: 0 - Asymptomatic  BP 119/83 (BP Location: Left Arm, Patient Position: Sitting)   Pulse 96   Temp (!) 96.4 F (35.8 C) (Tympanic)   Resp 18   Wt 122 lb 6.4 oz (55.5 kg)   LMP  (Approximate)   BMI 23.13 kg/m   Filed Weights   10/10/16 1351  Weight: 122 lb 6.4 oz (55.5 kg)    GENERAL: Well-nourished well-developed; Alert, no distress and comfortable.   Accompanied by her mother/Friend. She is able to sit on the exam table by herself.  EYES: no pallor or icterus OROPHARYNX: no thrush or ulceration; good dentition  NECK: supple, no masses felt LYMPH:  no palpable lymphadenopathy in the cervical, axillary or inguinal regions LUNGS: clear to auscultation and  No wheeze or crackles HEART/CVS: regular rate & rhythm and no murmurs; No lower extremity edema ABDOMEN:abdomen soft, non-tender and normal bowel sounds Musculoskeletal:no cyanosis of digits and no clubbing  PSYCH: alert & oriented x 3 with fluent speech NEURO: no focal motor/sensory deficits SKIN:  no rashes or significant lesions  LABORATORY DATA:  I have reviewed the data as listed    Component Value Date/Time   NA 132 (L) 10/10/2016 1315   NA 139 04/06/2015 1006   K 3.7 10/10/2016 1315   K 4.0 04/06/2015 1006   CL 100 (L) 10/10/2016 1315   CL 105 04/06/2015 1006   CO2 24 10/10/2016 1315   CO2 28 04/06/2015 1006   GLUCOSE 111 (H) 10/10/2016 1315   GLUCOSE 90 04/06/2015 1006   BUN 13 10/10/2016 1315   BUN 20 04/06/2015 1006   CREATININE 0.56 10/10/2016 1315   CREATININE 0.78 04/06/2015 1006   CALCIUM 8.8 (L) 10/10/2016 1315   CALCIUM 9.5 04/06/2015 1006   PROT 7.0 10/10/2016 1315   PROT 7.8 04/06/2015  1006   ALBUMIN 4.4 10/10/2016 1315   ALBUMIN 4.7 04/06/2015 1006   AST 38 10/10/2016 1315   AST 17 04/06/2015 1006   ALT 29 10/10/2016 1315   ALT 14 04/06/2015 1006   ALKPHOS 262 (H) 10/10/2016 1315   ALKPHOS 62 04/06/2015 1006   BILITOT 0.6 10/10/2016 1315   BILITOT 0.6 04/06/2015 1006   GFRNONAA >60 10/10/2016 1315   GFRNONAA >60 04/06/2015 1006   GFRAA >60 10/10/2016 1315   GFRAA >60 04/06/2015 1006    No results found for: SPEP, UPEP  Lab Results  Component Value Date   WBC 1.8 (L) 10/10/2016   NEUTROABS 0.8 (L) 10/10/2016   HGB 10.6 (L) 10/10/2016   HCT 30.9 (L) 10/10/2016  MCV 95.1 10/10/2016   PLT 247 10/10/2016      Chemistry      Component Value Date/Time   NA 132 (L) 10/10/2016 1315   NA 139 04/06/2015 1006   K 3.7 10/10/2016 1315   K 4.0 04/06/2015 1006   CL 100 (L) 10/10/2016 1315   CL 105 04/06/2015 1006   CO2 24 10/10/2016 1315   CO2 28 04/06/2015 1006   BUN 13 10/10/2016 1315   BUN 20 04/06/2015 1006   CREATININE 0.56 10/10/2016 1315   CREATININE 0.78 04/06/2015 1006      Component Value Date/Time   CALCIUM 8.8 (L) 10/10/2016 1315   CALCIUM 9.5 04/06/2015 1006   ALKPHOS 262 (H) 10/10/2016 1315   ALKPHOS 62 04/06/2015 1006   AST 38 10/10/2016 1315   AST 17 04/06/2015 1006   ALT 29 10/10/2016 1315   ALT 14 04/06/2015 1006   BILITOT 0.6 10/10/2016 1315   BILITOT 0.6 04/06/2015 1006       RADIOGRAPHIC STUDIES: I have personally reviewed the radiological images as listed and agreed with the findings in the report. No results found.   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   # HOLD cycle # 2 day- 15 today; [place]- 800; add neupogen x2 days; also concerns for clinical progression/poor tolerance to therapy. Check CT chest abdomen pelvis; await tumor marker from  today.  # Recommend Adriamycin Cytoxan every 3 weeks; discussed the potential side effects. Check 2-D echo. Get a port.   # Syncopal episode- check MRI of the brain.  # Growth factor-would be given as prophylaxis for chemotherapy-induced neutropenia to prevent febrile neutropenias. Discussed potential side effect- myalgias/arthralgias.   # Multiple bony lesions- continue X-geva every 4 weeks.  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 Discontinue Abraxane.  # Dehydration- recommend IV fluids today.  # Poor appetite recommend Marinol.   # Follow up next week; to review the results of the scan/ proceed with treatment ASAP.   Orders Placed This Encounter  Procedures  . CT ABDOMEN PELVIS W CONTRAST    Standing Status:   Future    Standing Expiration Date:   01/09/2018    Order Specific Question:   Reason for Exam (SYMPTOM  OR DIAGNOSIS REQUIRED)    Answer:   breast cancer    Order Specific Question:   Is the patient pregnant?    Answer:   No    Order Specific Question:   Preferred imaging location?    Answer:   [facility]  . CT CHEST W CONTRAST    Standing Status:   Future    Standing Expiration Date:   12/10/2017    Order Specific Question:   Reason for Exam (SYMPTOM  OR DIAGNOSIS REQUIRED)    Answer:   breast cancer    Order Specific Question:   Is the patient pregnant?    Answer:   No    Order Specific Question:   Preferred imaging location?    Answer:   [facility]  . MR BRAIN W WO CONTRAST    Standing Status:   Future    Standing Expiration Date:   12/10/2017    Order Specific Question:   Reason for Exam (SYMPTOM  OR DIAGNOSIS REQUIRED)    Answer:   breast cancer    Order Specific Question:   Preferred imaging  location?    Answer:   [facility]    Order Specific Question:   Does the patient have a pacemaker or implanted devices?    Answer:   No    Order Specific Question:   What is the patient's sedation requirement?    Answer:   No Sedation  . Ambulatory referral to  General Surgery    Referral Priority:   Urgent    Referral Type:   Surgical    Referral Reason:   Specialty Services Required    Referred to Provider:   Nestor Lewandowsky, MD    Requested Specialty:   General Surgery    Number of Visits Requested:   1  . ECHOCARDIOGRAM COMPLETE    Standing Status:   Future    Standing Expiration Date:   01/10/2018    Order Specific Question:   Where should this test be performed    Answer:   [facility]    Order Specific Question:   Complete or Limited study?    Answer:   Limited    Order Specific Question:   Does the patient have a known history of hypersensitivity to Perflutren (Chief Technology Officer for echocardiograms - CHECK ALLERGIES)    Answer:   Yes    Order Specific Question:   DO NOT administer Perflutren    Answer:   DO NOT administer Perflutren    Order Specific Question:   Expected Date:    Answer:   ASAP    Order Specific Question:   Reason for exam-Echo    Answer:   Chemotherapy evaluation  v87.41 / v58.11   All questions were answered. The patient knows to call the clinic with any problems, questions or concerns.      Cammie Sickle, MD 10/10/2016 5:42 PM

## 2016-10-10 NOTE — Assessment & Plan Note (Addendum)
#  Metastatic breast cancer ER/PR positive HER-2/neu negative- currently on Abraxane s/p cycle #2 day-8 last week.   # HOLD cycle # 2 day- 15 today; [place]- 800; add neupogen x2 days; also concerns for clinical progression/poor tolerance to therapy. Check CT chest abdomen pelvis; await tumor marker from today.  # Recommend Adriamycin Cytoxan every 3 weeks; discussed the potential side effects. Check 2-D echo. Get a port.   # Syncopal episode- check MRI of the brain.  # Growth factor-would be given as prophylaxis for chemotherapy-induced neutropenia to prevent febrile neutropenias. Discussed potential side effect- myalgias/arthralgias.   # Multiple bony lesions- continue X-geva every 4 weeks.  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 Discontinue Abraxane.  # Dehydration- recommend IV fluids today.  # Poor appetite recommend Marinol.   # Follow up next week; to review the results of the scan/ proceed with treatment ASAP.

## 2016-10-10 NOTE — Progress Notes (Signed)
DISCONTINUE ON PATHWAY REGIMEN - Breast  No Medical Intervention - Off Treatment.  REASON: Other Reason PRIOR TREATMENT: Off Treatment  START ON PATHWAY REGIMEN - Breast  BOS158: Capecitabine 1,000 mg/m2 BID D1-14, q21 Days Until Progression or Unacceptable Toxicity   A cycle is every 21 days:     Capecitabine (Xeloda(R)) 1,000 mg/m2 orally twice daily (2000 mg/m2/day) for 14 days followed by 7 days off) Dose Mod: None  **Always confirm dose/schedule in your pharmacy ordering system**    Patient Characteristics: Metastatic Chemotherapy, HER2/neu Negative/Unknown/Equivocal, ER +, Second Line, Prior Paclitaxel AJCC Stage Grouping: IV Current Disease Status: Distant Metastases AJCC M Stage: X ER Status: Positive (+) AJCC N Stage: X AJCC T Stage: X HER2/neu: Negative (-) PR Status: Positive (+) Line of therapy: Second Line Would you be surprised if this patient died  in the next year? I would be surprised if this patient died in the next year  Intent of Therapy: Non-Curative / Palliative Intent, Discussed with Patient

## 2016-10-10 NOTE — Progress Notes (Signed)
DISCONTINUE ON PATHWAY REGIMEN - Breast  BOS159: Paclitaxel 80 mg/m2 d1, d8, d15 q28 Days Until Progression or Unacceptable Toxicity   A cycle is every 28 days (3 weeks on and 1 week off):     Paclitaxel (Taxol(R)) 80 mg/m2 in 250 mL NS IV over 1 hour days 1, 8 and 15 (3 weeks on followed by 1 week off) Dose Mod: None  **Always confirm dose/schedule in your pharmacy ordering system**    REASON: Toxicities / Adverse Event PRIOR TREATMENT: BOS159: Paclitaxel 80 mg/m2 d1, d8, d15 q28 Days Until Progression or Unacceptable Toxicity TREATMENT RESPONSE: Unable to Evaluate  Breast - No Medical Intervention - Off Treatment.  Patient Characteristics: Metastatic Chemotherapy, HER2/neu Negative/Unknown/Equivocal, ER +, Second Line, No Prior Paclitaxel AJCC Stage Grouping: IV Current Disease Status: Distant Metastases AJCC M Stage: X ER Status: Positive (+) AJCC N Stage: X AJCC T Stage: X HER2/neu: Negative (-) PR Status: Positive (+) Line of therapy: Second Line Would you be surprised if this patient died  in the next year? I would be surprised if this patient died in the next year

## 2016-10-11 ENCOUNTER — Inpatient Hospital Stay: Payer: BLUE CROSS/BLUE SHIELD

## 2016-10-11 ENCOUNTER — Ambulatory Visit: Payer: BLUE CROSS/BLUE SHIELD

## 2016-10-11 ENCOUNTER — Telehealth: Payer: Self-pay | Admitting: *Deleted

## 2016-10-11 ENCOUNTER — Ambulatory Visit
Admission: RE | Admit: 2016-10-11 | Discharge: 2016-10-11 | Disposition: A | Discharge status: Home / Self Care | Payer: BLUE CROSS/BLUE SHIELD | Source: Ambulatory Visit | Attending: Internal Medicine | Admitting: Internal Medicine

## 2016-10-11 ENCOUNTER — Other Ambulatory Visit: Payer: Self-pay | Admitting: *Deleted

## 2016-10-11 DIAGNOSIS — C50811 Malignant neoplasm of overlapping sites of right female breast: Secondary | ICD-10-CM

## 2016-10-11 DIAGNOSIS — R188 Other ascites: Secondary | ICD-10-CM | POA: Diagnosis not present

## 2016-10-11 DIAGNOSIS — N838 Other noninflammatory disorders of ovary, fallopian tube and broad ligament: Secondary | ICD-10-CM | POA: Insufficient documentation

## 2016-10-11 DIAGNOSIS — K769 Liver disease, unspecified: Secondary | ICD-10-CM | POA: Insufficient documentation

## 2016-10-11 DIAGNOSIS — R55 Syncope and collapse: Secondary | ICD-10-CM | POA: Insufficient documentation

## 2016-10-11 DIAGNOSIS — Z170 Estrogen receptor positive status [ER+]: Secondary | ICD-10-CM | POA: Insufficient documentation

## 2016-10-11 DIAGNOSIS — R935 Abnormal findings on diagnostic imaging of other abdominal regions, including retroperitoneum: Secondary | ICD-10-CM | POA: Insufficient documentation

## 2016-10-11 DIAGNOSIS — C7951 Secondary malignant neoplasm of bone: Secondary | ICD-10-CM | POA: Diagnosis not present

## 2016-10-11 DIAGNOSIS — C50919 Malignant neoplasm of unspecified site of unspecified female breast: Secondary | ICD-10-CM

## 2016-10-11 DIAGNOSIS — Z95828 Presence of other vascular implants and grafts: Secondary | ICD-10-CM

## 2016-10-11 LAB — CANCER ANTIGEN 27.29: CA 27.29: 874.6 U/mL — AB (ref 0.0–38.6)

## 2016-10-11 MED ORDER — TBO-FILGRASTIM 480 MCG/0.8ML ~~LOC~~ SOSY
480.0000 ug | PREFILLED_SYRINGE | Freq: Once | SUBCUTANEOUS | Status: AC
  Administered 2016-10-11: 480 ug via SUBCUTANEOUS
  Filled 2016-10-11: qty 0.8

## 2016-10-11 MED ORDER — METHYLPREDNISOLONE SODIUM SUCC 125 MG IJ SOLR: 125.0000 mg | Freq: Once | INTRAMUSCULAR | Status: DC | PRN

## 2016-10-11 MED ORDER — DIPHENHYDRAMINE HCL 50 MG/ML IJ SOLN: 25.0000 mg | Freq: Once | INTRAMUSCULAR | Status: DC | PRN

## 2016-10-11 MED ORDER — LIDOCAINE-PRILOCAINE 2.5-2.5 % EX CREA: 1.0000 "application " | TOPICAL_CREAM | CUTANEOUS | 6 refills | Status: DC | PRN

## 2016-10-11 MED ORDER — IOPAMIDOL (ISOVUE-300) INJECTION 61%
100.0000 mL | Freq: Once | INTRAVENOUS | Status: AC | PRN
  Administered 2016-10-11: 100 mL via INTRAVENOUS

## 2016-10-11 MED ORDER — SODIUM CHLORIDE 0.9 % IV SOLN
Freq: Once | INTRAVENOUS | Status: DC | PRN
  Filled 2016-10-11: qty 1000

## 2016-10-11 MED ORDER — ALBUTEROL SULFATE (2.5 MG/3ML) 0.083% IN NEBU: 2.5000 mg | INHALATION_SOLUTION | Freq: Once | RESPIRATORY_TRACT | Status: DC | PRN

## 2016-10-11 NOTE — Telephone Encounter (Signed)
Spoke with patient. Discussed tumor markers with patient. MD will keep her tx plan the same. She gave verbal understanding. Pt asked for emla cream to be sent to pharmacy in preparation for her upcoming port placement.

## 2016-10-11 NOTE — Telephone Encounter (Signed)
-----   Message from Cammie Sickle, MD sent at 10/11/2016  8:22 AM EDT ----- Please inform patient that tumor marker is slightly low than previous- however recommend new chemotherapy as discussed yesterday.

## 2016-10-12 ENCOUNTER — Ambulatory Visit
Admission: RE | Admit: 2016-10-12 | Discharge: 2016-10-12 | Disposition: A | Discharge status: Home / Self Care | Payer: BLUE CROSS/BLUE SHIELD | Source: Ambulatory Visit | Attending: Radiation Oncology | Admitting: Radiation Oncology

## 2016-10-12 ENCOUNTER — Ambulatory Visit
Admission: RE | Admit: 2016-10-12 | Discharge: 2016-10-12 | Disposition: A | Discharge status: Home / Self Care | Payer: BLUE CROSS/BLUE SHIELD | Source: Ambulatory Visit | Attending: Internal Medicine | Admitting: Internal Medicine

## 2016-10-12 ENCOUNTER — Encounter: Payer: Self-pay | Admitting: Cardiothoracic Surgery

## 2016-10-12 ENCOUNTER — Inpatient Hospital Stay (HOSPITAL_BASED_OUTPATIENT_CLINIC_OR_DEPARTMENT_OTHER): Payer: BLUE CROSS/BLUE SHIELD | Admitting: Internal Medicine

## 2016-10-12 ENCOUNTER — Telehealth: Payer: Self-pay | Admitting: Internal Medicine

## 2016-10-12 ENCOUNTER — Encounter: Payer: Self-pay | Admitting: Radiation Oncology

## 2016-10-12 ENCOUNTER — Inpatient Hospital Stay: Payer: BLUE CROSS/BLUE SHIELD

## 2016-10-12 ENCOUNTER — Other Ambulatory Visit: Payer: Self-pay | Admitting: *Deleted

## 2016-10-12 ENCOUNTER — Ambulatory Visit (INDEPENDENT_AMBULATORY_CARE_PROVIDER_SITE_OTHER): Payer: BLUE CROSS/BLUE SHIELD | Admitting: Cardiothoracic Surgery

## 2016-10-12 VITALS — BP 129/81 | HR 83 | Temp 97.1°F | Wt 121.0 lb

## 2016-10-12 DIAGNOSIS — Z170 Estrogen receptor positive status [ER+]: Secondary | ICD-10-CM | POA: Insufficient documentation

## 2016-10-12 DIAGNOSIS — Z79899 Other long term (current) drug therapy: Secondary | ICD-10-CM

## 2016-10-12 DIAGNOSIS — C50811 Malignant neoplasm of overlapping sites of right female breast: Secondary | ICD-10-CM

## 2016-10-12 DIAGNOSIS — C7951 Secondary malignant neoplasm of bone: Secondary | ICD-10-CM | POA: Diagnosis not present

## 2016-10-12 DIAGNOSIS — Z79811 Long term (current) use of aromatase inhibitors: Secondary | ICD-10-CM | POA: Diagnosis not present

## 2016-10-12 DIAGNOSIS — Z7689 Persons encountering health services in other specified circumstances: Secondary | ICD-10-CM | POA: Diagnosis not present

## 2016-10-12 DIAGNOSIS — Z8042 Family history of malignant neoplasm of prostate: Secondary | ICD-10-CM

## 2016-10-12 DIAGNOSIS — G9389 Other specified disorders of brain: Secondary | ICD-10-CM

## 2016-10-12 DIAGNOSIS — R591 Generalized enlarged lymph nodes: Secondary | ICD-10-CM

## 2016-10-12 DIAGNOSIS — C7949 Secondary malignant neoplasm of other parts of nervous system: Secondary | ICD-10-CM | POA: Insufficient documentation

## 2016-10-12 DIAGNOSIS — C7931 Secondary malignant neoplasm of brain: Secondary | ICD-10-CM | POA: Insufficient documentation

## 2016-10-12 DIAGNOSIS — G936 Cerebral edema: Secondary | ICD-10-CM | POA: Insufficient documentation

## 2016-10-12 DIAGNOSIS — R55 Syncope and collapse: Secondary | ICD-10-CM

## 2016-10-12 DIAGNOSIS — C787 Secondary malignant neoplasm of liver and intrahepatic bile duct: Secondary | ICD-10-CM

## 2016-10-12 DIAGNOSIS — R634 Abnormal weight loss: Secondary | ICD-10-CM

## 2016-10-12 DIAGNOSIS — C50011 Malignant neoplasm of nipple and areola, right female breast: Secondary | ICD-10-CM

## 2016-10-12 DIAGNOSIS — Z8673 Personal history of transient ischemic attack (TIA), and cerebral infarction without residual deficits: Secondary | ICD-10-CM

## 2016-10-12 DIAGNOSIS — Z9221 Personal history of antineoplastic chemotherapy: Secondary | ICD-10-CM

## 2016-10-12 DIAGNOSIS — Z923 Personal history of irradiation: Secondary | ICD-10-CM

## 2016-10-12 DIAGNOSIS — R200 Anesthesia of skin: Secondary | ICD-10-CM

## 2016-10-12 DIAGNOSIS — R202 Paresthesia of skin: Secondary | ICD-10-CM

## 2016-10-12 DIAGNOSIS — Z5181 Encounter for therapeutic drug level monitoring: Secondary | ICD-10-CM | POA: Insufficient documentation

## 2016-10-12 DIAGNOSIS — I700 Atherosclerosis of aorta: Secondary | ICD-10-CM

## 2016-10-12 DIAGNOSIS — Z510 Encounter for antineoplastic radiation therapy: Secondary | ICD-10-CM | POA: Insufficient documentation

## 2016-10-12 DIAGNOSIS — J90 Pleural effusion, not elsewhere classified: Secondary | ICD-10-CM

## 2016-10-12 DIAGNOSIS — D701 Agranulocytosis secondary to cancer chemotherapy: Secondary | ICD-10-CM

## 2016-10-12 DIAGNOSIS — C50812 Malignant neoplasm of overlapping sites of left female breast: Secondary | ICD-10-CM | POA: Insufficient documentation

## 2016-10-12 DIAGNOSIS — Z809 Family history of malignant neoplasm, unspecified: Secondary | ICD-10-CM

## 2016-10-12 DIAGNOSIS — R630 Anorexia: Secondary | ICD-10-CM

## 2016-10-12 DIAGNOSIS — E860 Dehydration: Secondary | ICD-10-CM

## 2016-10-12 DIAGNOSIS — M25552 Pain in left hip: Secondary | ICD-10-CM

## 2016-10-12 DIAGNOSIS — R188 Other ascites: Secondary | ICD-10-CM

## 2016-10-12 LAB — ECHOCARDIOGRAM COMPLETE: Weight: 1936 oz

## 2016-10-12 MED ORDER — TBO-FILGRASTIM 480 MCG/0.8ML ~~LOC~~ SOSY
480.0000 ug | PREFILLED_SYRINGE | Freq: Once | SUBCUTANEOUS | Status: AC
  Administered 2016-10-12: 480 ug via SUBCUTANEOUS
  Filled 2016-10-12: qty 0.8

## 2016-10-12 MED ORDER — GADOBENATE DIMEGLUMINE 529 MG/ML IV SOLN
10.0000 mL | Freq: Once | INTRAVENOUS | Status: AC | PRN
  Administered 2016-10-12: 10 mL via INTRAVENOUS

## 2016-10-12 MED ORDER — DEXAMETHASONE 4 MG PO TABS: ORAL_TABLET | ORAL | 0 refills | Status: DC

## 2016-10-12 NOTE — Progress Notes (Signed)
*  PRELIMINARY RESULTS* Echocardiogram 2D Echocardiogram has been performed.  Elizabeth Mccoy 10/12/2016, 11:56 AM

## 2016-10-12 NOTE — Progress Notes (Signed)
Present illness: This 60 year old woman presented in 2015 with a right breast cancer with metastatic disease at presentation. She had a carcinoma with lobular features. She received previous radiation therapy to the hip in [facility] and has been on various chemotherapy regimens since then. Dr. Rogue Bussing recently ordered an MRI scan of the brain which shows at least 15 cerebellar and cerebral metastases. The patient had a syncopal episodeof uncertain etiology several days ago but otherwise has not had any neurologic symptoms. Dr. Rogue Bussing is planning to treat her with Adriamycin based chemotherapy. We are seeing her regarding whole brain radiation therapy prior to starting the Adriamycin. The patient has never had any radiation to the brain.  Past history: other than a cesarean section the patient's past medical anrgical history is negative.  Family history: her father had prostate cancer. Her sister has some condition which predisposes her to tumor formation but does not have cancer thus far.  Social history: She is married and is accompanied by her mother today. She does not work outside the home.  Review of systems: Complete 10 point review of systems is negative. She did have a syncopal episode several days ago but she had not eaten or drunk sufficient fluids and got into a hot showerand do that she will felt faint. 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  Medications: See list in the electronic medical record.  Allergies: No known drug allergies  Physical examination: General: The patient is well-developed and well-nourished and in no acute distress. Vital signs are in the electronic medical record. Eyes: extraocular movements are intact. Sclerae and conjunctivae are normal. Pupils are equal round and reactive. Ears nose mouth and throat: The ears and nose are normal. There are no palpable masses. There are no lesions of the oral cavity or oropharynx. Respiratory:Chest is  clear to percussion and auscultation Cardiac: Regular rhythm without murmurs, rubs, or gallops. Abdomen: Soft without masses, organomegaly, or tenderness. Genital rectal pelvic: Deferred. Musculoskeletal: There is no spine tenderness. Extremities show no edema cyanosis or clubbing. Lymph: There is no neck, axillary, or groin adenopathy palpable. Neurologic: Detailed neurologic exam shows no abnormalities of the cranial nerves. Motor and sensory exams are intact Psychiatric the patient has no evidence of thought disorder or dementia and is alert and well-oriented. Skin: No chronic skin abnormalities noted.  Laboratory/x-ray: The MRI scan shows multiple cerebral and cerebellar metastases measuring up to about 1 cm in size. The radiologist raised the question of leptomeningeal involvement.  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 She clearly needs whole brain radiation therapy for these and we need to go ahead and get that started so that she can move on to her Adriamycin-based chemotherapy.  Plan: I discussed the risks and benefits of the proposed course of radiation at length with the patient. She was given ample opportunity to ask questions. I was convinced her questions were answered to her satisfaction and that she understood potential risks and potential benefits of treatment. We discussed treatment options including no further treatment, treatment at another facility, and treatment by another physician. She wishes to proceed with radiation here.I was unable to identify any social or cultural issues that would interfere with her therapy. She will be simulated and begin her treatment as soon as scheduling allows. We plan to treat her with a dose of 30 gray in 10 fractions.

## 2016-10-12 NOTE — Progress Notes (Signed)
Urgent apt- acute add on - Patient returned to clinic - to discuss mri brain results  RN present with MD when discuss results and tx plan with patient

## 2016-10-12 NOTE — Progress Notes (Signed)
Patient ID: Elizabeth Mccoy, female   DOB: [birth year], 60 y.o.   MRN: [id number]  Chief Complaint  Patient presents with  . Other    Consult- Port-Placement    Referred By Dr. Ephraim Hamburger  Reason for Referral Port A Cath  HPI Location, Quality, Duration, Severity, Timing, Context, Modifying Factors, Associated Signs and Symptoms.  Elizabeth Mccoy is a 60 y.o. female.  She was diagnosed with breast cancer earlier this year has had 4 cycles of chemotherapy. She initially was treated without a Port-A-Cath but now has had difficulty with intravenous access and requires a Port-A-Cath. She originally was diagnosed with right-sided breast cancer and is being treated with chemotherapy alone. She states that there is no plan for radiation therapy or surgery. She is aware of the indications and risks of Port-A-Cath placement.   Past Medical History:  Diagnosis Date  . Breast cancer, right ([place]) 2015   rad tx's on right hip.  . Menopause 12/10/2006   2008    Past Surgical History:  Procedure Laterality Date  . CESAREAN SECTION      Family History  Problem Relation Age of Onset  . Cancer Father     prostate ca  . Cancer Sister     cancer unknown primary    Social History Social History  Substance Use Topics  . Smoking status: Never Smoker  . Smokeless tobacco: Never Used  . Alcohol use No    Allergies  Allergen Reactions  . Beef Extract Anaphylaxis  . Pork (Porcine) Protein Anaphylaxis  . Beef-Derived Products Hives    Current Outpatient Prescriptions  Medication Sig Dispense Refill  . calcium-vitamin D (OSCAL WITH D) 500-200 MG-UNIT per tablet Take 1 tablet by mouth 3 (three) times daily.    Marland Kitchen dronabinol (MARINOL) 5 MG capsule Take 1 capsule (5 mg total) by mouth 2 (two) times daily before a meal. 60 capsule 1  . lidocaine-prilocaine (EMLA) cream Apply 1 application topically as needed. 30 g 6  . loratadine (CLARITIN) 10 MG tablet Take 10 mg by mouth daily.     . Multiple Vitamins-Minerals (MULTIVITAMIN WITH MINERALS) tablet Take 1 tablet by mouth daily.    Marland Kitchen Prochlorperazine Maleate (COMPAZINE PO) Take by mouth.     No current facility-administered medications for this visit.       Review of Systems A complete review of systems was asked and was negative except for the following positive findingsSwelling of her legs, wheezing, easy bruising.  Blood pressure 129/81, pulse 83, temperature 97.1 F (36.2 C), temperature source Oral, weight 121 lb (54.9 kg).  Physical Exam CONSTITUTIONAL:  Pleasant, well-developed, well-nourished, and in no acute distress. EYES: Pupils equal and reactive to light, Sclera non-icteric EARS, NOSE, MOUTH AND THROAT:  The oropharynx was clear.  Dentition is good repair.  Oral mucosa pink and moist. LYMPH NODES:  Lymph nodes in the neck and axillae were normal RESPIRATORY:  Lungs were clear.  Normal respiratory effort without pathologic use of accessory muscles of respiration CARDIOVASCULAR: Heart was regular without murmurs.  There were no carotid bruits. GI: The abdomen was soft, nontender, and nondistended. There were no palpable masses. There was no hepatosplenomegaly. There were normal bowel sounds in all quadrants. GU:  Rectal deferred.   MUSCULOSKELETAL:  Normal muscle strength and tone.  No clubbing or cyanosis.   SKIN:  There were no pathologic skin lesions.  There were no nodules on palpation. NEUROLOGIC:  Sensation is normal.  Cranial nerves are grossly intact. PSYCH:  Oriented to person, place and time.  Mood and affect are normal.  Data Reviewed CT scan  I have personally reviewed the patient's imaging, laboratory findings and medical records.    Assessment    Need for Port-A-Cath placement and metastatic breast cancer    Plan    I reviewed with her the indications and risks of Port-A-Cath placement. Risks of bleeding, infection, pneumothorax were all reviewed. I also reviewed her laboratory  studies. Her platelet count is normal and we will check her prothrombin time and partial thromboplastin times today.       Nestor Lewandowsky, MD 10/12/2016, 10:47 AM

## 2016-10-12 NOTE — Patient Instructions (Signed)
Please remember to look at your blue sheet in case you have questions.  Remember to go to your Pre-Admit appointment on 10/15/2016 at 11:30 AM.

## 2016-10-12 NOTE — Telephone Encounter (Signed)
[name], [name]) - contacted daughter of patient. She will give her the msg that patient needs to come asap to discuss abn test results

## 2016-10-12 NOTE — Progress Notes (Signed)
[place] OFFICE PROGRESS NOTE  Patient Care Team: Gerilyn Pilgrim, FNP as PCP - General (Nurse Practitioner)  Cancer of breast [facility])   Staging form: Breast, AJCC 7th Edition     Clinical: Stage IV (T1b, N0, M1) - Signed by Forest Gleason, MD on 05/09/2015    Oncology History   1carcinoma of breast biopsy done from the mass at 9:00 position (right breast) at [facility] on October 20, 2014.   T1 cN0 M1 stage IV disease [id number] biopsies positive for invasive mammary carcinoma with both Dr. lobular features.  Estrogen receptor +94%.  Progesterone receptor +95%.  HER-2/neu receptor -0. 2.  X-rays of the lumbar spine done in now September of 2015 revealed diffuse sclerotic bone lesion. 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  Liver is normal.  There are multiple sub-centimeter porta hepatis and pelvic and inguinal lymphadenopathy 4.  Recent has received 2 weeks of radiation therapy to the right hip at [facility] 5.  Has been started on letrozole 3 weeks ago (November 22, 2014) 6.recent has been switched to [facility] and letrozole 2 weeks ago starting from January 2016- (IBRANCE 125 mg by mouth for 3 weeks with one week off ) dose of IBRANCE has been reduced 100 mg because of neutropenia  # June 2017- CT Stable Bone mets;  Bone scan- slight increase in left femur uptake/ rising TM; MRI pelvis STABLE- cont Ibrance  # Increasing TM; July 19th 2017- START fasolodex + cont IBRANCE  # SEP 2017-CT progression multiple liver mets/ bone mets - START Taxol [stopped sec to acute infusion reaction with 3rd treatment]  # OCT 18th 2017- ABRAXANE     Malignant neoplasm of overlapping sites of right breast [facility])    Carcinoma of overlapping sites of right breast in female, estrogen receptor positive ([place])   10/10/2016 Initial Diagnosis    Carcinoma of overlapping sites  of right breast in female, estrogen receptor positive ([place])      INTERVAL HISTORY:  Elizabeth Mccoy 60 y.o.  female pleasant patient above history of Metastatic breast cancer to bone currently Abraxane status post cycle #2 day 8 is here for follow-up/ to review the results of her restaging CAT scans and MRI of the brain.  Patient continues to complain of poor appetite. Denies any headaches. Complains of mild numbness of the right thumb and left leg. No more syncopal episodes.  No nausea vomiting. Patient has been taking Tylenol for her left hip pain. She has not wanted to narcotics.    REVIEW OF SYSTEMS:  A complete 10 point review of system is done which is negative except mentioned above/history of present illness.   PAST MEDICAL HISTORY :  Past Medical History:  Diagnosis Date  . Breast cancer, right ([place]) 2015   rad tx's on right hip.  . Menopause 12/10/2006   2008    PAST SURGICAL HISTORY :   Past Surgical History:  Procedure Laterality Date  . CESAREAN SECTION      FAMILY HISTORY :   Family History  Problem Relation Age of Onset  . Cancer Father     prostate ca  . Cancer Sister     cancer unknown primary    SOCIAL HISTORY:   Social History  Substance Use Topics  . Smoking status: Never Smoker  . Smokeless tobacco: Never Used  . Alcohol use No  ALLERGIES:  is allergic to beef extract; pork (porcine) protein; and beef-derived products.  MEDICATIONS:  Current Outpatient Prescriptions  Medication Sig Dispense Refill  . calcium-vitamin D (OSCAL WITH D) 500-200 MG-UNIT per tablet Take 1 tablet by mouth 3 (three) times daily.    Marland Kitchen dronabinol (MARINOL) 5 MG capsule Take 1 capsule (5 mg total) by mouth 2 (two) times daily before a meal. 60 capsule 1  . lidocaine-prilocaine (EMLA) cream Apply 1 application topically as needed. 30 g 6  . loratadine (CLARITIN) 10 MG tablet Take 10 mg by mouth daily.    . Multiple Vitamins-Minerals (MULTIVITAMIN WITH MINERALS)  tablet Take 1 tablet by mouth daily.    Marland Kitchen Prochlorperazine Maleate (COMPAZINE PO) Take by mouth.    . dexamethasone (DECADRON) 4 MG tablet Every 8 hours with food. 60 tablet 0   No current facility-administered medications for this visit.     PHYSICAL EXAMINATION: ECOG PERFORMANCE STATUS: 0 - Asymptomatic  LMP  (Approximate)   There were no vitals filed for this visit.  GENERAL: Well-nourished well-developed; Alert, no distress and comfortable.   Accompanied by her mother. She is able to sit on the exam table by herself.  EYES: no pallor or icterus OROPHARYNX: no thrush or ulceration; good dentition  NECK: supple, no masses felt LYMPH:  no palpable lymphadenopathy in the cervical, axillary or inguinal regions LUNGS: clear to auscultation and  No wheeze or crackles HEART/CVS: regular rate & rhythm and no murmurs; No lower extremity edema ABDOMEN:abdomen soft, non-tender and normal bowel sounds Musculoskeletal:no cyanosis of digits and no clubbing  PSYCH: alert & oriented x 3 with fluent speech NEURO: no focal motor/sensory deficits SKIN:  no rashes or significant lesions  LABORATORY DATA:  I have reviewed the data as listed    Component Value Date/Time   NA 132 (L) 10/10/2016 1315   NA 139 04/06/2015 1006   K 3.7 10/10/2016 1315   K 4.0 04/06/2015 1006   CL 100 (L) 10/10/2016 1315   CL 105 04/06/2015 1006   CO2 24 10/10/2016 1315   CO2 28 04/06/2015 1006   GLUCOSE 111 (H) 10/10/2016 1315   GLUCOSE 90 04/06/2015 1006   BUN 13 10/10/2016 1315   BUN 20 04/06/2015 1006   CREATININE 0.56 10/10/2016 1315   CREATININE 0.78 04/06/2015 1006   CALCIUM 8.8 (L) 10/10/2016 1315   CALCIUM 9.5 04/06/2015 1006   PROT 7.0 10/10/2016 1315   PROT 7.8 04/06/2015 1006   ALBUMIN 4.4 10/10/2016 1315   ALBUMIN 4.7 04/06/2015 1006   AST 38 10/10/2016 1315   AST 17 04/06/2015 1006   ALT 29 10/10/2016 1315   ALT 14 04/06/2015 1006   ALKPHOS 262 (H) 10/10/2016 1315   ALKPHOS 62 04/06/2015  1006   BILITOT 0.6 10/10/2016 1315   BILITOT 0.6 04/06/2015 1006   GFRNONAA >60 10/10/2016 1315   GFRNONAA >60 04/06/2015 1006   GFRAA >60 10/10/2016 1315   GFRAA >60 04/06/2015 1006    No results found for: SPEP, UPEP  Lab Results  Component Value Date   WBC 1.8 (L) 10/10/2016   NEUTROABS 0.8 (L) 10/10/2016   HGB 10.6 (L) 10/10/2016   HCT 30.9 (L) 10/10/2016   MCV 95.1 10/10/2016   PLT 247 10/10/2016      Chemistry      Component Value Date/Time   NA 132 (L) 10/10/2016 1315   NA 139 04/06/2015 1006   K 3.7 10/10/2016 1315   K 4.0 04/06/2015 1006   CL  100 (L) 10/10/2016 1315   CL 105 04/06/2015 1006   CO2 24 10/10/2016 1315   CO2 28 04/06/2015 1006   BUN 13 10/10/2016 1315   BUN 20 04/06/2015 1006   CREATININE 0.56 10/10/2016 1315   CREATININE 0.78 04/06/2015 1006      Component Value Date/Time   CALCIUM 8.8 (L) 10/10/2016 1315   CALCIUM 9.5 04/06/2015 1006   ALKPHOS 262 (H) 10/10/2016 1315   ALKPHOS 62 04/06/2015 1006   AST 38 10/10/2016 1315   AST 17 04/06/2015 1006   ALT 29 10/10/2016 1315   ALT 14 04/06/2015 1006   BILITOT 0.6 10/10/2016 1315   BILITOT 0.6 04/06/2015 1006       RADIOGRAPHIC STUDIES: I have personally reviewed the radiological images as listed and agreed with the findings in the report. Ct Chest W Contrast  Result Date: 10/11/2016 CLINICAL DATA:  Metastatic breast cancer. EXAM: CT CHEST, ABDOMEN, AND PELVIS WITH CONTRAST TECHNIQUE: Multidetector CT imaging of the chest, abdomen and pelvis was performed following the standard protocol during bolus administration of intravenous contrast. CONTRAST:  18m ISOVUE-300 IOPAMIDOL (ISOVUE-300) INJECTION 61% COMPARISON:  08/29/2016 FINDINGS: CT CHEST FINDINGS Cardiovascular: The heart size appears normal. Aortic atherosclerosis noted. Mediastinum/Nodes: The trachea appears patent and is midline. Normal appearance of the esophagus. Right axillary node measures 9 mm, image number 18 of series 2.  Previously this measured the same. Interval decrease in soft tissue stranding within the right axilla which may be related to prior instrumentation. No enlarged mediastinal or hilar nodes. Lungs/Pleura: Small right pleural effusion. Mild diffuse bronchial wall thickening noted. Left upper lobe pulmonary nodule is unchanged measuring 4 mm, image 56 of series 4. Perifissural nodule in the left lower lobe is stable measuring 4 mm, image number 80 of series 4. No new or enlarging pulmonary nodules identified Musculoskeletal: Extensive, widespread sclerotic bone metastases again noted. Subjectively increased to previous exam. CT ABDOMEN PELVIS FINDINGS Hepatobiliary: Multifocal liver metastases again identified. Index lesion within the right lobe measures 2.6 cm, image 41 of series 2. Previously this measured the same. Lateral segment of left lobe of liver lesion measures 1.5 cm, image 49 of series 2. Previously 2.1 cm. The index lesion within the lateral right lobe of liver measures 2 cm, image 51 of series 2. Previously 2.3 cm. Index lesion within the inferior right lobe measures 2.4 cm, image 59 of series 2. Previously 2.6 cm. Gallbladder appears normal. No biliary dilatation. Pancreas: Normal appearance of the pancreas. Spleen: No change in small low density structure within the spleen measuring 6 mm, image 51 of series 2. Adrenals/Urinary Tract: The adrenal glands are normal. There is persistent bilateral pelvocaliectasis. No hydroureter. Enhancing nodule along the anterior wall of the bladder is again noted, image 81 89 of series 6. Stomach/Bowel: The stomach is normal. The small bowel loops have a normal caliber. No bowel obstruction. No pathologic dilatation of the colon. Increase scratch set there is mild thickening of the appendix which measures 8 mm in diameter, image 77 of series 2. No free fluid or fluid collections noted within in the right lower quadrant. Vascular/Lymphatic: Normal appearance of the  abdominal aorta. No enlarged upper abdominal lymph nodes. There is no pelvic or inguinal adenopathy. Reproductive: The uterus appears unremarkable. The right ovary measures 2.8 x 2.0 cm, image 89 of series 2. Previously 2.8 x 1.5 cm. Other: There is a small amount of free fluid identified within the pelvis. This is new from previous exam. Additionally, there is a  suggestion subtle soft tissue nodularity within the peritoneal fat. For example, image number 64 of series 2 Musculoskeletal: Diffuse all osseous sclerosis compatible secondary to metastatic disease is again identified. IMPRESSION: 1. Index liver lesions are slightly decreased in size from previous exam. 2. Widespread sclerotic bone metastasis as noted previously. Subjectively there may be an increase in the sclerosis which could reflect healing of bone metastases or progression of sclerotic bone metastases. 3. New ascites identified within the pelvis. Subtle areas of soft tissue nodularity within the peritoneum is concerning for development of peritoneal metastases. 4. Slight enlargement of the right ovary. Attention to the right ovary on follow-up imaging to monitor for potential ovarian metastases. Electronically Signed   By: Kerby Moors M.D.   On: 10/11/2016 11:59   Mr Jeri Cos KG Contrast  Result Date: 10/12/2016 CLINICAL DATA:  60 year old female with metastatic breast cancer. Staging. Subsequent encounter. EXAM: MRI HEAD WITHOUT AND WITH CONTRAST TECHNIQUE: Multiplanar, multiecho pulse sequences of the brain and surrounding structures were obtained without and with intravenous contrast. CONTRAST:  73m MULTIHANCE GADOBENATE DIMEGLUMINE 529 MG/ML IV SOLN COMPARISON:  PET-CT 11/14/2015. FINDINGS: Brain: Numerous small brain metastases. There are at least 15 small supratentorial metastases ranging from punctate to 10 mm diameter. There is also extensive metastatic disease affecting the cerebellum with widespread small areas of abnormal  enhancement, many of which are suspicious for leptomeningeal disease (series 16 images 14 and 15). The largest individual cerebellar lesions are 8-9 mm. Despite the extensive disease there is only occasional mild cerebral and cerebellar edema, and no significant intracranial mass effect. No hemorrhagic metastases. No abnormal ependymoma 1 enhancement. No pachymeningeal thickening or enhancement. No restricted diffusion or evidence of acute infarction. No ventriculomegaly. No acute intracranial hemorrhage identified. Negative pituitary. Negative cervicomedullary junction. No chronic cerebral blood products identified. There are small chronic lacunar infarcts in the left cerebellum. Subtle chronic cortical encephalomalacia in the right parietal lobe (series 9, image 21) compatible with previous small posterior right MCA infarct. Vascular: Major intracranial vascular flow voids are preserved, the distal left vertebral artery appears dominant. Skull and upper cervical spine: Diffuse abnormal bone marrow signal throughout the cervical spine and at the skullbase in keeping with widespread osseous metastatic disease. No destructive calvarial lesion identified. Negative visualized cervical spinal cord. Sinuses/Orbits: Abnormal heterogeneous appearance of the bilateral intraorbital soft tissues including the right lateral rectus muscle which is expanded in T2 hyperintense (series 14, image 20 and series 9, image 9). Similar heterogeneity throughout the left orbital soft tissues. There seems to be a mild degree of enophthalmos. The globes appear remain intact. Trace paranasal sinus mucosal thickening. Other: Mastoids are clear. Visible internal auditory structures appear normal. Negative scalp soft tissues. IMPRESSION: 1. Widespread metastatic disease to the brain. At least 15 small supratentorial metastases ranging from punctate to 10 mm diameter, and extensive cerebellar metastatic disease which appears highly suspicious  for leptomeningeal metastases. 2. Only mild scattered brain edema and no significant intracranial mass effect. 3. Bilateral orbit soft tissue metastases (scirrhous infiltration of the orbits). 4. Widespread osseous metastatic disease. Electronically Signed   By: HGenevie AnnM.D.   On: 10/12/2016 13:18   Ct Abdomen Pelvis W Contrast  Result Date: 10/11/2016 CLINICAL DATA:  Metastatic breast cancer. EXAM: CT CHEST, ABDOMEN, AND PELVIS WITH CONTRAST TECHNIQUE: Multidetector CT imaging of the chest, abdomen and pelvis was performed following the standard protocol during bolus administration of intravenous contrast. CONTRAST:  1088mISOVUE-300 IOPAMIDOL (ISOVUE-300) INJECTION 61% COMPARISON:  08/29/2016 FINDINGS: CT  CHEST FINDINGS Cardiovascular: The heart size appears normal. Aortic atherosclerosis noted. Mediastinum/Nodes: The trachea appears patent and is midline. Normal appearance of the esophagus. Right axillary node measures 9 mm, image number 18 of series 2. Previously this measured the same. Interval decrease in soft tissue stranding within the right axilla which may be related to prior instrumentation. No enlarged mediastinal or hilar nodes. Lungs/Pleura: Small right pleural effusion. Mild diffuse bronchial wall thickening noted. Left upper lobe pulmonary nodule is unchanged measuring 4 mm, image 56 of series 4. Perifissural nodule in the left lower lobe is stable measuring 4 mm, image number 80 of series 4. No new or enlarging pulmonary nodules identified Musculoskeletal: Extensive, widespread sclerotic bone metastases again noted. Subjectively increased to previous exam. CT ABDOMEN PELVIS FINDINGS Hepatobiliary: Multifocal liver metastases again identified. Index lesion within the right lobe measures 2.6 cm, image 41 of series 2. Previously this measured the same. Lateral segment of left lobe of liver lesion measures 1.5 cm, image 49 of series 2. Previously 2.1 cm. The index lesion within the lateral right  lobe of liver measures 2 cm, image 51 of series 2. Previously 2.3 cm. Index lesion within the inferior right lobe measures 2.4 cm, image 59 of series 2. Previously 2.6 cm. Gallbladder appears normal. No biliary dilatation. Pancreas: Normal appearance of the pancreas. Spleen: No change in small low density structure within the spleen measuring 6 mm, image 51 of series 2. Adrenals/Urinary Tract: The adrenal glands are normal. There is persistent bilateral pelvocaliectasis. No hydroureter. Enhancing nodule along the anterior wall of the bladder is again noted, image 81 89 of series 6. Stomach/Bowel: The stomach is normal. The small bowel loops have a normal caliber. No bowel obstruction. No pathologic dilatation of the colon. Increase scratch set there is mild thickening of the appendix which measures 8 mm in diameter, image 77 of series 2. No free fluid or fluid collections noted within in the right lower quadrant. Vascular/Lymphatic: Normal appearance of the abdominal aorta. No enlarged upper abdominal lymph nodes. There is no pelvic or inguinal adenopathy. Reproductive: The uterus appears unremarkable. The right ovary measures 2.8 x 2.0 cm, image 89 of series 2. Previously 2.8 x 1.5 cm. Other: There is a small amount of free fluid identified within the pelvis. This is new from previous exam. Additionally, there is a suggestion subtle soft tissue nodularity within the peritoneal fat. For example, image number 64 of series 2 Musculoskeletal: Diffuse all osseous sclerosis compatible secondary to metastatic disease is again identified. IMPRESSION: 1. Index liver lesions are slightly decreased in size from previous exam. 2. Widespread sclerotic bone metastasis as noted previously. Subjectively there may be an increase in the sclerosis which could reflect healing of bone metastases or progression of sclerotic bone metastases. 3. New ascites identified within the pelvis. Subtle areas of soft tissue nodularity within the  peritoneum is concerning for development of peritoneal metastases. 4. Slight enlargement of the right ovary. Attention to the right ovary on follow-up imaging to monitor for potential ovarian metastases. Electronically Signed   By: Kerby Moors M.D.   On: 10/11/2016 11:59     ASSESSMENT & PLAN:  Carcinoma of overlapping sites of right breast in female, estrogen receptor positive ([place]) # Metastatic breast cancer ER/PR positive HER-2/neu negative- currently s/p Abraxane s/p cycle #2 day-8 last week. Chemotherapy is on hold [facility] discussion below]. CT scan chest abdomen pelvis shows- slightly improved liver lesions; however mild ascites possible omental patient's new. CT scan of the brain  shows multiple brain metastases- up to 10 mm.  # Overall progressive disease is noted; recommend Adriamycin Cytoxan every 3 weeks; however hold chemotherapy because of brain metastases [C discussion below]  # Multiple brain metastases- discussed with radiation oncology; evaluated today. Planned radiation next week. Start dexamethasone 4 mg 3 times a day  # Chemotherapy-induced neutropenia- Neupogen 2. #2 today.  # Multiple bony lesions- continue X-geva every 4 weeks.  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 Discontinue Abraxane.  # Discussed the serious concerns with brain metastases; we will plan to start the chemotherapy after finishing radiation.   # I reviewed the blood work- with the patient in detail; also reviewed the imaging independently [as summarized above]; and with the patient in detail.   # I was see patient back in 2 weeks; labs. According to postpone the port for now.    Orders Placed This Encounter  Procedures  . CBC with Differential    Standing Status:   Future    Standing Expiration Date:   10/12/2017  . Comprehensive metabolic panel    Standing Status:   Future    Standing Expiration Date:   10/12/2017   All questions were answered. The patient knows to call the clinic with any problems, questions or  concerns.      Cammie Sickle, MD 10/12/2016 5:29 PM

## 2016-10-12 NOTE — Telephone Encounter (Signed)
lvm re: to discuss the results of the MRI brain; spoke to rad-Onc will be able to see pt today. Cannot leave message on home phone. Dr.B

## 2016-10-12 NOTE — Assessment & Plan Note (Addendum)
#  Metastatic breast cancer ER/PR positive HER-2/neu negative- currently s/p Abraxane s/p cycle #2 day-8 last week. Chemotherapy is on hold [facility] discussion below]. CT scan chest abdomen pelvis shows- slightly improved liver lesions; however mild ascites possible omental patient's new. CT scan of the brain shows multiple brain metastases- up to 10 mm.  # Overall progressive disease is noted; recommend Adriamycin Cytoxan every 3 weeks; however hold chemotherapy because of brain metastases [C discussion below]  # Multiple brain metastases- discussed with radiation oncology; evaluated today. Planned radiation next week. Start dexamethasone 4 mg 3 times a day  # Chemotherapy-induced neutropenia- Neupogen 2. #2 today.  # Multiple bony lesions- continue X-geva every 4 weeks.  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 Discontinue Abraxane.  # Discussed the serious concerns with brain metastases; we will plan to start the chemotherapy after finishing radiation.   # I reviewed the blood work- with the patient in detail; also reviewed the imaging independently [as summarized above]; and with the patient in detail.   # I was see patient back in 2 weeks; labs. According to postpone the port for now.

## 2016-10-15 ENCOUNTER — Other Ambulatory Visit: Payer: BLUE CROSS/BLUE SHIELD

## 2016-10-16 ENCOUNTER — Institutional Professional Consult (permissible substitution): Payer: BLUE CROSS/BLUE SHIELD | Admitting: Radiation Oncology

## 2016-10-16 ENCOUNTER — Ambulatory Visit
Admission: RE | Admit: 2016-10-16 | Discharge: 2016-10-16 | Disposition: A | Discharge status: Home / Self Care | Payer: BLUE CROSS/BLUE SHIELD | Source: Ambulatory Visit | Attending: Radiation Oncology | Admitting: Radiation Oncology

## 2016-10-16 ENCOUNTER — Ambulatory Visit: Admit: 2016-10-16 | Payer: BLUE CROSS/BLUE SHIELD | Admitting: Cardiothoracic Surgery

## 2016-10-16 ENCOUNTER — Inpatient Hospital Stay: Payer: BLUE CROSS/BLUE SHIELD

## 2016-10-16 DIAGNOSIS — C50811 Malignant neoplasm of overlapping sites of right female breast: Secondary | ICD-10-CM | POA: Diagnosis not present

## 2016-10-16 DIAGNOSIS — Z510 Encounter for antineoplastic radiation therapy: Secondary | ICD-10-CM | POA: Diagnosis not present

## 2016-10-16 DIAGNOSIS — C50812 Malignant neoplasm of overlapping sites of left female breast: Secondary | ICD-10-CM | POA: Diagnosis not present

## 2016-10-16 DIAGNOSIS — C50919 Malignant neoplasm of unspecified site of unspecified female breast: Secondary | ICD-10-CM

## 2016-10-16 DIAGNOSIS — Z170 Estrogen receptor positive status [ER+]: Secondary | ICD-10-CM | POA: Diagnosis not present

## 2016-10-16 SURGERY — INSERTION, TUNNELED CENTRAL VENOUS DEVICE, WITH PORT
Anesthesia: Choice

## 2016-10-16 MED ORDER — DENOSUMAB 120 MG/1.7ML ~~LOC~~ SOLN
120.0000 mg | Freq: Once | SUBCUTANEOUS | Status: AC
  Administered 2016-10-16: 120 mg via SUBCUTANEOUS
  Filled 2016-10-16: qty 1.7

## 2016-10-17 ENCOUNTER — Inpatient Hospital Stay: Payer: BLUE CROSS/BLUE SHIELD

## 2016-10-17 ENCOUNTER — Inpatient Hospital Stay: Payer: BLUE CROSS/BLUE SHIELD | Admitting: Internal Medicine

## 2016-10-18 ENCOUNTER — Ambulatory Visit
Admission: RE | Admit: 2016-10-18 | Discharge: 2016-10-18 | Disposition: A | Discharge status: Home / Self Care | Payer: BLUE CROSS/BLUE SHIELD | Source: Ambulatory Visit | Attending: Radiation Oncology | Admitting: Radiation Oncology

## 2016-10-18 ENCOUNTER — Telehealth: Payer: Self-pay | Admitting: *Deleted

## 2016-10-18 DIAGNOSIS — C50812 Malignant neoplasm of overlapping sites of left female breast: Secondary | ICD-10-CM | POA: Diagnosis not present

## 2016-10-18 MED ORDER — TRAMADOL HCL 50 MG PO TABS: 50.0000 mg | ORAL_TABLET | Freq: Three times a day (TID) | ORAL | 0 refills | Status: DC | PRN

## 2016-10-18 NOTE — Telephone Encounter (Signed)
Requesting pain med for the aching in her legs. She is unable to sleep at night due to the constant aching. Denies edema, states that she was offer ed med for this at her last appt, but has decided she needs it now as the tylenol is not working. Please advise

## 2016-10-18 NOTE — Telephone Encounter (Signed)
Per VO Dr Rogue Bussing Tramadol 50 mg q 8 h prn # 40. Patient advised will fax in Rx

## 2016-10-20 ENCOUNTER — Telehealth: Payer: Self-pay | Admitting: Hematology and Oncology

## 2016-10-20 NOTE — Telephone Encounter (Signed)
Re:  Pain  Elizabeth Mccoy called for patient.  Patient received Tramadol 50 mg po q 8 hours prn pain.  She has received no relief with prescription to date. She notes it is "like taking water".  She stats pain is a level 8 of 10 both before and after Tramadol.  Discussed that Tramadol 100 mg po q 6 hours can be tried (maximum total daily amount is 400 mg).  If after one dose, she again does not receive any relief, discussed phone follow-up and evaluation in the ER as narcotics can not be prescribed over the phone.  Lequita Asal, MD

## 2016-10-21 ENCOUNTER — Encounter: Payer: Self-pay | Admitting: Emergency Medicine

## 2016-10-21 ENCOUNTER — Emergency Department: Payer: BLUE CROSS/BLUE SHIELD

## 2016-10-21 ENCOUNTER — Other Ambulatory Visit: Payer: Self-pay

## 2016-10-21 ENCOUNTER — Inpatient Hospital Stay
Admission: EM | Admit: 2016-10-21 | Discharge: 2016-11-05 | DRG: 435 | Disposition: A | Discharge status: Hospice | Payer: BLUE CROSS/BLUE SHIELD | Attending: Internal Medicine | Admitting: Internal Medicine

## 2016-10-21 DIAGNOSIS — Z170 Estrogen receptor positive status [ER+]: Secondary | ICD-10-CM | POA: Diagnosis not present

## 2016-10-21 DIAGNOSIS — R262 Difficulty in walking, not elsewhere classified: Secondary | ICD-10-CM

## 2016-10-21 DIAGNOSIS — C799 Secondary malignant neoplasm of unspecified site: Secondary | ICD-10-CM

## 2016-10-21 DIAGNOSIS — R112 Nausea with vomiting, unspecified: Secondary | ICD-10-CM | POA: Diagnosis present

## 2016-10-21 DIAGNOSIS — K219 Gastro-esophageal reflux disease without esophagitis: Secondary | ICD-10-CM | POA: Diagnosis present

## 2016-10-21 DIAGNOSIS — J690 Pneumonitis due to inhalation of food and vomit: Secondary | ICD-10-CM | POA: Diagnosis present

## 2016-10-21 DIAGNOSIS — M6281 Muscle weakness (generalized): Secondary | ICD-10-CM

## 2016-10-21 DIAGNOSIS — R748 Abnormal levels of other serum enzymes: Secondary | ICD-10-CM | POA: Diagnosis not present

## 2016-10-21 DIAGNOSIS — B370 Candidal stomatitis: Secondary | ICD-10-CM | POA: Diagnosis present

## 2016-10-21 DIAGNOSIS — C7949 Secondary malignant neoplasm of other parts of nervous system: Secondary | ICD-10-CM | POA: Diagnosis present

## 2016-10-21 DIAGNOSIS — C787 Secondary malignant neoplasm of liver and intrahepatic bile duct: Principal | ICD-10-CM | POA: Diagnosis present

## 2016-10-21 DIAGNOSIS — Z8042 Family history of malignant neoplasm of prostate: Secondary | ICD-10-CM

## 2016-10-21 DIAGNOSIS — R0600 Dyspnea, unspecified: Secondary | ICD-10-CM | POA: Diagnosis not present

## 2016-10-21 DIAGNOSIS — R531 Weakness: Secondary | ICD-10-CM | POA: Diagnosis not present

## 2016-10-21 DIAGNOSIS — Z9221 Personal history of antineoplastic chemotherapy: Secondary | ICD-10-CM

## 2016-10-21 DIAGNOSIS — Z6822 Body mass index (BMI) 22.0-22.9, adult: Secondary | ICD-10-CM | POA: Diagnosis not present

## 2016-10-21 DIAGNOSIS — J189 Pneumonia, unspecified organism: Secondary | ICD-10-CM

## 2016-10-21 DIAGNOSIS — C7951 Secondary malignant neoplasm of bone: Secondary | ICD-10-CM | POA: Diagnosis present

## 2016-10-21 DIAGNOSIS — Z923 Personal history of irradiation: Secondary | ICD-10-CM | POA: Diagnosis not present

## 2016-10-21 DIAGNOSIS — E878 Other disorders of electrolyte and fluid balance, not elsewhere classified: Secondary | ICD-10-CM | POA: Diagnosis present

## 2016-10-21 DIAGNOSIS — R29898 Other symptoms and signs involving the musculoskeletal system: Secondary | ICD-10-CM

## 2016-10-21 DIAGNOSIS — C50812 Malignant neoplasm of overlapping sites of left female breast: Secondary | ICD-10-CM | POA: Diagnosis present

## 2016-10-21 DIAGNOSIS — H47619 Cortical blindness, unspecified side of brain: Secondary | ICD-10-CM | POA: Diagnosis present

## 2016-10-21 DIAGNOSIS — R630 Anorexia: Secondary | ICD-10-CM | POA: Diagnosis not present

## 2016-10-21 DIAGNOSIS — R627 Adult failure to thrive: Secondary | ICD-10-CM | POA: Diagnosis present

## 2016-10-21 DIAGNOSIS — Z7189 Other specified counseling: Secondary | ICD-10-CM | POA: Diagnosis not present

## 2016-10-21 DIAGNOSIS — Z510 Encounter for antineoplastic radiation therapy: Secondary | ICD-10-CM | POA: Diagnosis not present

## 2016-10-21 DIAGNOSIS — C50911 Malignant neoplasm of unspecified site of right female breast: Secondary | ICD-10-CM | POA: Diagnosis not present

## 2016-10-21 DIAGNOSIS — E43 Unspecified severe protein-calorie malnutrition: Secondary | ICD-10-CM | POA: Insufficient documentation

## 2016-10-21 DIAGNOSIS — Z931 Gastrostomy status: Secondary | ICD-10-CM

## 2016-10-21 DIAGNOSIS — C7931 Secondary malignant neoplasm of brain: Secondary | ICD-10-CM | POA: Diagnosis present

## 2016-10-21 DIAGNOSIS — H547 Unspecified visual loss: Secondary | ICD-10-CM | POA: Diagnosis present

## 2016-10-21 DIAGNOSIS — E871 Hypo-osmolality and hyponatremia: Secondary | ICD-10-CM | POA: Diagnosis present

## 2016-10-21 DIAGNOSIS — E860 Dehydration: Secondary | ICD-10-CM | POA: Diagnosis present

## 2016-10-21 DIAGNOSIS — Z66 Do not resuscitate: Secondary | ICD-10-CM | POA: Diagnosis present

## 2016-10-21 DIAGNOSIS — Z95828 Presence of other vascular implants and grafts: Secondary | ICD-10-CM

## 2016-10-21 DIAGNOSIS — C50011 Malignant neoplasm of nipple and areola, right female breast: Secondary | ICD-10-CM

## 2016-10-21 DIAGNOSIS — C50811 Malignant neoplasm of overlapping sites of right female breast: Secondary | ICD-10-CM | POA: Diagnosis present

## 2016-10-21 DIAGNOSIS — Z8279 Family history of other congenital malformations, deformations and chromosomal abnormalities: Secondary | ICD-10-CM

## 2016-10-21 DIAGNOSIS — R110 Nausea: Secondary | ICD-10-CM | POA: Diagnosis not present

## 2016-10-21 DIAGNOSIS — Z515 Encounter for palliative care: Secondary | ICD-10-CM

## 2016-10-21 LAB — CBC
HCT: 39.6 % (ref 35.0–47.0)
HEMOGLOBIN: 13.3 g/dL (ref 12.0–16.0)
MCH: 31.8 pg (ref 26.0–34.0)
MCHC: 33.6 g/dL (ref 32.0–36.0)
MCV: 94.6 fL (ref 80.0–100.0)
Platelets: 155 10*3/uL (ref 150–440)
RBC: 4.18 MIL/uL (ref 3.80–5.20)
RDW: 19.6 % — ABNORMAL HIGH (ref 11.5–14.5)
WBC: 14.3 10*3/uL — ABNORMAL HIGH (ref 3.6–11.0)

## 2016-10-21 LAB — URINALYSIS COMPLETE WITH MICROSCOPIC (ARMC ONLY)
Bilirubin Urine: NEGATIVE
Glucose, UA: NEGATIVE mg/dL
Hgb urine dipstick: NEGATIVE
Leukocytes, UA: NEGATIVE
NITRITE: NEGATIVE
PH: 7 (ref 5.0–8.0)
PROTEIN: NEGATIVE mg/dL
RBC / HPF: NONE SEEN RBC/hpf (ref 0–5)
SPECIFIC GRAVITY, URINE: 1.01 (ref 1.005–1.030)
Squamous Epithelial / LPF: NONE SEEN

## 2016-10-21 LAB — COMPREHENSIVE METABOLIC PANEL
ALK PHOS: 229 U/L — AB (ref 38–126)
ALT: 64 U/L — ABNORMAL HIGH (ref 14–54)
ANION GAP: 11 (ref 5–15)
AST: 54 U/L — ABNORMAL HIGH (ref 15–41)
Albumin: 4.2 g/dL (ref 3.5–5.0)
BILIRUBIN TOTAL: 1 mg/dL (ref 0.3–1.2)
BUN: 17 mg/dL (ref 6–20)
CALCIUM: 7.8 mg/dL — AB (ref 8.9–10.3)
CO2: 19 mmol/L — ABNORMAL LOW (ref 22–32)
Chloride: 97 mmol/L — ABNORMAL LOW (ref 101–111)
Creatinine, Ser: 0.48 mg/dL (ref 0.44–1.00)
GFR calc non Af Amer: >60 mL/min (ref >60)
Glucose, Bld: 117 mg/dL — ABNORMAL HIGH (ref 65–99)
Potassium: 4.4 mmol/L (ref 3.5–5.1)
SODIUM: 127 mmol/L — AB (ref 135–145)
TOTAL PROTEIN: 7.1 g/dL (ref 6.5–8.1)

## 2016-10-21 LAB — MAGNESIUM: Magnesium: 2.4 mg/dL (ref 1.7–2.4)

## 2016-10-21 LAB — LIPASE, BLOOD: Lipase: 636 U/L — ABNORMAL HIGH (ref 11–51)

## 2016-10-21 MED ORDER — PANTOPRAZOLE SODIUM 40 MG PO TBEC
40.0000 mg | DELAYED_RELEASE_TABLET | Freq: Every evening | ORAL | Status: DC
  Filled 2016-10-21: qty 1

## 2016-10-21 MED ORDER — SODIUM CHLORIDE 0.9 % IV SOLN
1.0000 g | Freq: Once | INTRAVENOUS | Status: AC
  Administered 2016-10-21: 1 g via INTRAVENOUS
  Filled 2016-10-21 (×2): qty 10

## 2016-10-21 MED ORDER — ENOXAPARIN SODIUM 40 MG/0.4ML ~~LOC~~ SOLN: 40.0000 mg | SUBCUTANEOUS | Status: DC

## 2016-10-21 MED ORDER — GI COCKTAIL ~~LOC~~
30.0000 mL | Freq: Once | ORAL | Status: AC
  Administered 2016-10-21: 30 mL via ORAL
  Filled 2016-10-21: qty 30

## 2016-10-21 MED ORDER — SODIUM CHLORIDE 0.9 % IV SOLN
Freq: Once | INTRAVENOUS | Status: AC
  Administered 2016-10-21: 15:00:00 via INTRAVENOUS

## 2016-10-21 MED ORDER — ONDANSETRON HCL 4 MG/2ML IJ SOLN
4.0000 mg | Freq: Once | INTRAMUSCULAR | Status: AC
  Administered 2016-10-21: 4 mg via INTRAVENOUS
  Filled 2016-10-21: qty 2

## 2016-10-21 MED ORDER — DRONABINOL 2.5 MG PO CAPS
5.0000 mg | ORAL_CAPSULE | Freq: Two times a day (BID) | ORAL | Status: DC
  Administered 2016-10-22 – 2016-10-26 (×6): 5 mg via ORAL
  Filled 2016-10-21 (×7): qty 2

## 2016-10-21 MED ORDER — SODIUM CHLORIDE 0.9 % IV SOLN
INTRAVENOUS | Status: DC
  Administered 2016-10-21 – 2016-11-02 (×16): via INTRAVENOUS

## 2016-10-21 MED ORDER — SODIUM CHLORIDE 0.9 % IV SOLN
Freq: Once | INTRAVENOUS | Status: AC
  Administered 2016-10-21: 18:00:00 via INTRAVENOUS

## 2016-10-21 MED ORDER — MORPHINE SULFATE (PF) 4 MG/ML IV SOLN
2.0000 mg | INTRAVENOUS | Status: DC | PRN
  Administered 2016-10-21 – 2016-10-22 (×6): 2 mg via INTRAVENOUS
  Filled 2016-10-21 (×6): qty 1

## 2016-10-21 MED ORDER — MORPHINE SULFATE (PF) 4 MG/ML IV SOLN
4.0000 mg | Freq: Once | INTRAVENOUS | Status: AC
  Administered 2016-10-21: 4 mg via INTRAVENOUS
  Filled 2016-10-21: qty 1

## 2016-10-21 MED ORDER — ONDANSETRON HCL 40 MG/20ML IJ SOLN
8.0000 mg | Freq: Four times a day (QID) | INTRAMUSCULAR | Status: DC
  Administered 2016-10-21 – 2016-10-31 (×37): 8 mg via INTRAVENOUS
  Filled 2016-10-21 (×42): qty 4

## 2016-10-21 MED ORDER — DEXAMETHASONE SODIUM PHOSPHATE 4 MG/ML IJ SOLN
4.0000 mg | Freq: Four times a day (QID) | INTRAMUSCULAR | Status: DC
  Administered 2016-10-21 – 2016-11-03 (×52): 4 mg via INTRAVENOUS
  Filled 2016-10-21 (×52): qty 1

## 2016-10-21 MED ORDER — FAMOTIDINE IN NACL 20-0.9 MG/50ML-% IV SOLN
20.0000 mg | INTRAVENOUS | Status: DC
  Administered 2016-10-22: 20 mg via INTRAVENOUS
  Filled 2016-10-21 (×2): qty 50

## 2016-10-21 NOTE — H&P (Signed)
Elizabeth Mccoy is an 60 y.o. female.   Chief Complaint: Nausea and pain. HPI: This is a 60 year old female who has a history of metastatic breast cancer. She has metastasis to bone and liver and now to brain. She is to undergo radiation therapy for the brain metastases tomorrow. She is recently started on some Decadron. She's now developed intractable nausea has been  unable to take medications consistently including pain medications. She appears weak and unable to eat.  Past Medical History:  Diagnosis Date  . Breast cancer, right ([place]) 2015   rad tx's on right hip.  . Menopause 12/10/2006   2008    Past Surgical History:  Procedure Laterality Date  . CESAREAN SECTION      Family History  Problem Relation Age of Onset  . Cancer Father     prostate ca  . Cancer Sister     cancer unknown primary   Social History:  reports that she has never smoked. She has never used smokeless tobacco. She reports that she does not drink alcohol or use drugs.  Allergies:  Allergies  Allergen Reactions  . Beef Extract Anaphylaxis  . Pork (Porcine) Protein Anaphylaxis  . Beef-Derived Products Hives     (Not in a hospital admission)  Results for orders placed or performed during the hospital encounter of 10/21/16 (from the past 48 hour(s))  Lipase, blood     Status: Abnormal   Collection Time: 10/21/16  2:58 PM  Result Value Ref Range   Lipase 636 (H) 11 - 51 U/L    Comment: RESULT CONFIRMED BY MANUAL DIL[facility]  Comprehensive metabolic panel     Status: Abnormal   Collection Time: 10/21/16  2:58 PM  Result Value Ref Range   Sodium 127 (L) 135 - 145 mmol/L   Potassium 4.4 3.5 - 5.1 mmol/L   Chloride 97 (L) 101 - 111 mmol/L   CO2 19 (L) 22 - 32 mmol/L   Glucose, Bld 117 (H) 65 - 99 mg/dL   BUN 17 6 - 20 mg/dL   Creatinine, Ser 0.48 0.44 - 1.00 mg/dL   Calcium 7.8 (L) 8.9 - 10.3 mg/dL   Total Protein 7.1 6.5 - 8.1 g/dL   Albumin 4.2 3.5 - 5.0 g/dL   AST 54 (H) 15 - 41 U/L    ALT 64 (H) 14 - 54 U/L   Alkaline Phosphatase 229 (H) 38 - 126 U/L   Total Bilirubin 1.0 0.3 - 1.2 mg/dL   GFR calc non Af Amer >60 >60 mL/min   GFR calc Af Amer >60 >60 mL/min    Comment: (NOTE) The eGFR has been calculated using the CKD EPI equation. This calculation has not been validated in all clinical situations. eGFR's persistently <60 mL/min signify possible Chronic Kidney Disease.    Anion gap 11 5 - 15  CBC     Status: Abnormal   Collection Time: 10/21/16  2:58 PM  Result Value Ref Range   WBC 14.3 (H) 3.6 - 11.0 K/uL   RBC 4.18 3.80 - 5.20 MIL/uL   Hemoglobin 13.3 12.0 - 16.0 g/dL   HCT 39.6 35.0 - 47.0 %   MCV 94.6 80.0 - 100.0 fL   MCH 31.8 26.0 - 34.0 pg   MCHC 33.6 32.0 - 36.0 g/dL   RDW 19.6 (H) 11.5 - 14.5 %   Platelets 155 150 - 440 K/uL  Magnesium     Status: None   Collection Time: 10/21/16  2:58 PM  Result  Value Ref Range   Magnesium 2.4 1.7 - 2.4 mg/dL   Ct Head Wo Contrast  Result Date: 10/21/2016 CLINICAL DATA:  Metastatic breast cancer. EXAM: CT HEAD WITHOUT CONTRAST TECHNIQUE: Contiguous axial images were obtained from the base of the skull through the vertex without intravenous contrast. COMPARISON:  MRI head 10/12/2016 FINDINGS: Brain: Progressive ventricular dilatation since the prior MRI. This is most consistent with hydrocephalus in this patient with metastatic disease and may be due to leptomeningeal tumor. No obstructing mass lesion. Multiple small metastatic deposits are seen throughout the brain on the MRI. These are not well seen on unenhanced CT however there is mild edema in the right occipital lobe and in the right frontal lobe over the convexity consistent with metastatic disease. Negative for hemorrhage. Vascular: Negative for dense MCA Skull: Patchy sclerotic disease in the clivus compatible with metastatic disease. No destructive skull lesion. Sinuses/Orbits: Infiltration of the orbit with enlargement of the extra-ocular muscles most  consistent with metastatic disease to the orbit as identified on MRI. Other: None IMPRESSION: Progressive ventricular dilatation since the recent MRI compatible with hydrocephalus. 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 Multiple brain metastasis better seen on MRI. No large obstructing mass lesion is identified. Negative for hemorrhage Metastatic disease to the clivus. Metastatic disease to the orbit bilaterally. Electronically Signed   By: Franchot Gallo M.D.   On: 10/21/2016 17:22    Review of Systems  Constitutional: Negative for chills and fever.  HENT: Negative for hearing loss.   Eyes: Negative for blurred vision.  Respiratory: Negative for shortness of breath.   Cardiovascular: Negative for chest pain.  Gastrointestinal: Positive for heartburn, nausea and vomiting.  Genitourinary: Negative for dysuria.  Musculoskeletal: Positive for joint pain.  Skin: Negative for rash.  Neurological: Negative for sensory change.    Blood pressure (!) 152/75, pulse 64, temperature 97.4 F (36.3 C), temperature source Oral, resp. rate (!) 23, height _0  (1.549 m), weight 55.8 kg (123 lb), SpO2 100 %. Physical Exam  Constitutional: She is oriented to person, place, and time. She appears well-developed and well-nourished. No distress.  HENT:  Head: Normocephalic.  Mouth/Throat: Oropharynx is clear and moist. No oropharyngeal exudate.  Eyes: Pupils are equal, round, and reactive to light. No scleral icterus.  Neck: No JVD present. No tracheal deviation present. No thyromegaly present.  Cardiovascular: Normal rate.   No murmur heard. Respiratory:  Clear to auscultation. Not using accessory muscles.  GI: Soft. Bowel sounds are normal.  Soft, nontender, bowel sounds positive. No epigastric tenderness however exam was done after large doses of morphine was given.  Musculoskeletal: She exhibits no edema.  Neurological: She is alert and oriented to person,  place, and time.  Skin: Skin is warm and dry.     Assessment/Plan 1. Intractable nausea. She's been unable to eat and unable to take medications. She does have some elevated lipase which makes it suspicious with her metastases that she may have some pancreatitis. We'll give her IV Zofran when necessary.  2. Possible pancreatitis. She does have elevated lipase and the intractable nausea. She had no pain on exam however she was given pain medications prior to my exam. We'll give her IV fluids and just a clear liquid diet and see how this progresses.  3. Metastatic breast cancer. As noted above she has metastases to the liver bone and now brain. She has CT scan tonight in the ER which showed increasing hydrocephalus. Prognosis appears  poor. I'm going to change her Decadron and IV as she is unable to take it by mouth. [facility] consult oncology. At this point she still full code. Will change to IV morphine for comfort.  Total time spent was 45 minutes.  Baxter Hire, MD 10/21/2016, 6:39 PM

## 2016-10-21 NOTE — ED Triage Notes (Signed)
C/O decreased PO intake, low energy, nausea, and reflux.  Also c/o mouth numbness x 1 day.

## 2016-10-21 NOTE — ED Provider Notes (Signed)
[facility] Emergency Department Provider Note        Time seen: ----------------------------------------- 3:13 PM on 10/21/2016 -----------------------------------------    I have reviewed the triage vital signs and the nursing notes.   HISTORY  Chief Complaint Nausea and Dizziness    HPI Elizabeth Mccoy is a 60 y.o. female who presents to ER for decreased oral intake for several days, low energy, nausea and reflux. She had mouth numbness and some numbness on the right side of her face for the last 1 day. She states she has had nausea but no vomiting or diarrhea. She states she is still suffering from acid reflux. Currently she is receiving chemotherapy for breast cancer. Patient reports she's been placed on appetite stimulant and given medications without improvement.   Past Medical History:  Diagnosis Date  . Breast cancer, right ([place]) 2015   rad tx's on right hip.  . Menopause 12/10/2006   2008    Patient Active Problem List   Diagnosis Date Noted  . 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 Carcinoma of overlapping sites of right breast in female, estrogen receptor positive ([place]) 10/10/2016  . Pancytopenia, acquired ([place]) 09/05/2016  . Chest wall discomfort 09/05/2016  . Metastases to the liver ([place]) 09/05/2016  . Metastasis to bone ([place]) 06/27/2016  . Malignant neoplasm of overlapping sites of right breast ([place]) 06/15/2016  . Menopause     Past Surgical History:  Procedure Laterality Date  . CESAREAN SECTION      Allergies Beef extract; Pork (porcine) protein; and Beef-derived products  Social History Social History  Substance Use Topics  . Smoking status: Never Smoker  . Smokeless tobacco: Never Used  . Alcohol use No    Review of Systems Constitutional: Negative for fever. Cardiovascular: Negative for chest pain. Respiratory: Negative for shortness of breath. Gastrointestinal: Negative  for abdominal pain, Positive for nausea Genitourinary: Negative for dysuria. Musculoskeletal: Negative for back pain. Skin: Negative for rash. Neurological: Negative for headaches, Positive for facial numbness, generalized weakness  10-point ROS otherwise negative.  ____________________________________________   PHYSICAL EXAM:  VITAL SIGNS: ED Triage Vitals  Enc Vitals Group     BP 10/21/16 1459 (!) 137/96     Pulse Rate 10/21/16 1459 81     Resp 10/21/16 1459 19     Temp 10/21/16 1459 97.4 F (36.3 C)     Temp Source 10/21/16 1459 Oral     SpO2 10/21/16 1459 100 %     Weight 10/21/16 1455 123 lb (55.8 kg)     Height 10/21/16 1455 5\' 1"  (1.549 m)     Head Circumference --      Peak Flow --      Pain Score 10/21/16 1456 6     Pain Loc --      Pain Edu? --      Excl. in [place]? --     Constitutional: Alert and oriented. Chronically ill appearing, mild distress Eyes: Conjunctivae are normal. PERRL. Normal extraocular movements. ENT   Head: Normocephalic and atraumatic.   Nose: No congestion/rhinnorhea.   Mouth/Throat: Mucous membranes are dry   Neck: No stridor. Cardiovascular: Normal rate, regular rhythm. No murmurs, rubs, or gallops. Respiratory: Normal respiratory effort without tachypnea nor retractions. Breath sounds are clear and equal bilaterally. No wheezes/rales/rhonchi. Gastrointestinal: Soft and nontender. Normal bowel sounds Musculoskeletal: Nontender with normal range of motion in all extremities. No lower extremity tenderness nor edema. Neurologic:  Normal speech and  language. Generalized weakness, decreased sensation in the right side of the face Skin:  Skin is warm, dry and intact. No rash noted. Poor skin turgor Psychiatric: Flat affect ____________________________________________  EKG: Interpreted by me. Sinus rhythm rate 81 bpm, normal PR interval, normal QRS, normal QT, normal axis.  ____________________________________________  ED  COURSE:  Pertinent labs & imaging results that were available during my care of the patient were reviewed by me and considered in my medical decision making (see chart for details). Clinical Course   Patient presents to the ER appearing markedly dehydrated likely from chemotherapy. We will assess with labs, give IV fluids and reevaluate.  Procedures ____________________________________________   LABS (pertinent positives/negatives)  Labs Reviewed  LIPASE, BLOOD - Abnormal; Notable for the following:       Result Value   Lipase 636 (*)    All other components within normal limits  COMPREHENSIVE METABOLIC PANEL - Abnormal; Notable for the following:    Sodium 127 (*)    Chloride 97 (*)    CO2 19 (*)    Glucose, Bld 117 (*)    Calcium 7.8 (*)    AST 54 (*)    ALT 64 (*)    Alkaline Phosphatase 229 (*)    All other components within normal limits  CBC - Abnormal; Notable for the following:    WBC 14.3 (*)    RDW 19.6 (*)    All other components within normal limits  MAGNESIUM  URINALYSIS COMPLETEWITH MICROSCOPIC ([facility] ONLY)    RADIOLOGY Images were viewed by me  CT head IMPRESSION: Progressive ventricular dilatation since the recent MRI compatible with hydrocephalus. 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  Multiple brain metastasis better seen on MRI. No large obstructing mass lesion is identified.  Negative for hemorrhage  Metastatic disease to the clivus. Metastatic disease to the orbit bilaterally.   ____________________________________________  FINAL ASSESSMENT AND PLAN  Weakness, dehydration, pancreatitis, metastatic cancer  Plan: Patient with labs and imaging as dictated above. Patient presented to the ER with progressive weakness appearing dehydrated. We have started her on IV fluid. She has hyponatremia and hypochloremia, hypocalcemia, elevated lipase and leukocytosis.I will discuss with oncology and  medicine for admission.   Earleen Newport, MD   Note: This dictation was prepared with Dragon dictation. Any transcriptional errors that result from this process are unintentional    Earleen Newport, MD 10/21/16 1739

## 2016-10-22 ENCOUNTER — Ambulatory Visit
Admission: RE | Admit: 2016-10-22 | Discharge: 2016-10-22 | Disposition: A | Discharge status: Home / Self Care | Payer: BLUE CROSS/BLUE SHIELD | Source: Ambulatory Visit | Attending: Radiation Oncology | Admitting: Radiation Oncology

## 2016-10-22 ENCOUNTER — Ambulatory Visit: Payer: BLUE CROSS/BLUE SHIELD

## 2016-10-22 ENCOUNTER — Telehealth: Payer: Self-pay | Admitting: Internal Medicine

## 2016-10-22 DIAGNOSIS — E860 Dehydration: Secondary | ICD-10-CM

## 2016-10-22 DIAGNOSIS — Z809 Family history of malignant neoplasm, unspecified: Secondary | ICD-10-CM

## 2016-10-22 DIAGNOSIS — Z8042 Family history of malignant neoplasm of prostate: Secondary | ICD-10-CM

## 2016-10-22 DIAGNOSIS — R630 Anorexia: Secondary | ICD-10-CM

## 2016-10-22 DIAGNOSIS — C7931 Secondary malignant neoplasm of brain: Secondary | ICD-10-CM

## 2016-10-22 DIAGNOSIS — Z170 Estrogen receptor positive status [ER+]: Secondary | ICD-10-CM

## 2016-10-22 DIAGNOSIS — G919 Hydrocephalus, unspecified: Secondary | ICD-10-CM

## 2016-10-22 DIAGNOSIS — R200 Anesthesia of skin: Secondary | ICD-10-CM

## 2016-10-22 DIAGNOSIS — C50812 Malignant neoplasm of overlapping sites of left female breast: Secondary | ICD-10-CM | POA: Diagnosis not present

## 2016-10-22 DIAGNOSIS — C7951 Secondary malignant neoplasm of bone: Secondary | ICD-10-CM

## 2016-10-22 DIAGNOSIS — C50911 Malignant neoplasm of unspecified site of right female breast: Secondary | ICD-10-CM

## 2016-10-22 DIAGNOSIS — C787 Secondary malignant neoplasm of liver and intrahepatic bile duct: Principal | ICD-10-CM

## 2016-10-22 DIAGNOSIS — M25551 Pain in right hip: Secondary | ICD-10-CM

## 2016-10-22 DIAGNOSIS — Z79899 Other long term (current) drug therapy: Secondary | ICD-10-CM

## 2016-10-22 LAB — CBC WITH DIFFERENTIAL/PLATELET
BASOS ABS: 0.1 10*3/uL (ref 0–0.1)
BASOS PCT: 0 %
EOS ABS: 0 10*3/uL (ref 0–0.7)
EOS PCT: 0 %
HCT: 37.5 % (ref 35.0–47.0)
Hemoglobin: 12.7 g/dL (ref 12.0–16.0)
Lymphocytes Relative: 5 %
Lymphs Abs: 0.9 10*3/uL — ABNORMAL LOW (ref 1.0–3.6)
MCH: 32.1 pg (ref 26.0–34.0)
MCHC: 33.9 g/dL (ref 32.0–36.0)
MCV: 94.7 fL (ref 80.0–100.0)
MONO ABS: 0.4 10*3/uL (ref 0.2–0.9)
Monocytes Relative: 2 %
Neutro Abs: 17.7 10*3/uL — ABNORMAL HIGH (ref 1.4–6.5)
Neutrophils Relative %: 93 %
PLATELETS: 143 10*3/uL — AB (ref 150–440)
RBC: 3.97 MIL/uL (ref 3.80–5.20)
RDW: 19.7 % — AB (ref 11.5–14.5)
WBC: 19.2 10*3/uL — AB (ref 3.6–11.0)

## 2016-10-22 LAB — BASIC METABOLIC PANEL
ANION GAP: 7 (ref 5–15)
BUN: 13 mg/dL (ref 6–20)
CHLORIDE: 104 mmol/L (ref 101–111)
CO2: 20 mmol/L — ABNORMAL LOW (ref 22–32)
Calcium: 7.5 mg/dL — ABNORMAL LOW (ref 8.9–10.3)
Creatinine, Ser: 0.4 mg/dL — ABNORMAL LOW (ref 0.44–1.00)
GFR calc Af Amer: >60 mL/min (ref >60)
Glucose, Bld: 128 mg/dL — ABNORMAL HIGH (ref 65–99)
POTASSIUM: 4.6 mmol/L (ref 3.5–5.1)
SODIUM: 131 mmol/L — AB (ref 135–145)

## 2016-10-22 LAB — PROTIME-INR
INR: 1.02
Prothrombin Time: 13.4 seconds (ref 11.4–15.2)

## 2016-10-22 LAB — LIPASE, BLOOD: LIPASE: 139 U/L — AB (ref 11–51)

## 2016-10-22 LAB — APTT

## 2016-10-22 MED ORDER — ONDANSETRON HCL 4 MG/2ML IJ SOLN
4.0000 mg | Freq: Four times a day (QID) | INTRAMUSCULAR | Status: DC | PRN
Start: 2016-10-22 — End: 2016-11-05
  Administered 2016-10-25 – 2016-10-29 (×3): 4 mg via INTRAVENOUS
  Filled 2016-10-22 (×3): qty 2

## 2016-10-22 MED ORDER — ACETAMINOPHEN 325 MG PO TABS
650.0000 mg | ORAL_TABLET | Freq: Four times a day (QID) | ORAL | Status: DC | PRN
  Administered 2016-10-23 (×2): 650 mg via ORAL
  Filled 2016-10-22 (×2): qty 2

## 2016-10-22 MED ORDER — PANTOPRAZOLE SODIUM 40 MG PO TBEC
40.0000 mg | DELAYED_RELEASE_TABLET | Freq: Every day | ORAL | Status: DC
  Administered 2016-10-22: 17:00:00 40 mg via ORAL
  Filled 2016-10-22 (×2): qty 1

## 2016-10-22 MED ORDER — MORPHINE SULFATE (PF) 4 MG/ML IV SOLN
2.0000 mg | INTRAVENOUS | Status: DC | PRN
  Administered 2016-10-22 – 2016-10-30 (×51): 2 mg via INTRAVENOUS
  Filled 2016-10-22 (×54): qty 1

## 2016-10-22 MED ORDER — OXYCODONE HCL 5 MG PO TABS
5.0000 mg | ORAL_TABLET | ORAL | Status: DC | PRN
  Administered 2016-10-22: 5 mg via ORAL
  Filled 2016-10-22: qty 1

## 2016-10-22 MED ORDER — ENSURE ENLIVE PO LIQD: 237.0000 mL | Freq: Two times a day (BID) | ORAL | Status: DC

## 2016-10-22 NOTE — Progress Notes (Signed)
[facility] NOTE  Patient Care Team: Gerilyn Pilgrim, FNP as PCP - General (Nurse Practitioner)  CHIEF COMPLAINTS/PURPOSE OF CONSULTATION:  Metastatic breast cancer  HISTORY OF PRESENTING ILLNESS:  Elizabeth Mccoy 60 y.o.  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 Patient had been started on radiation last week; along with dexamethasone. Chemotherapy is currently on hold.  Last few days patient noted to have increasing weakness of her Right lower extremity; and left upper extremity. Also complains of numbness of her chin/face. Denies any double vision. Poor by mouth intake; poor appetite. Patient had not been taking her steroids on a regular basis. She also complained of increasing pain in her right hip. Denies any bowel or bladder incontinence. Patient was admitted to the hospital for symptom control. Noncontrast CT of the brain showed multiple brain lesions- and also hydrocephalus; new compared to MRI done a few weeks ago.  After receiving IV fluids; steroids patient seems to have improved. However continues to have weakness of her right leg and left arm. No nausea or vomiting.  ROS: A complete 10 point review of system is done which is negative except mentioned above in history of present illness  MEDICAL HISTORY:  Past Medical History:  Diagnosis Date  . Breast cancer, right ([place]) 2015   rad tx's on right hip.  . Menopause 12/10/2006   2008    SURGICAL HISTORY: Past Surgical History:  Procedure Laterality Date  . CESAREAN SECTION      SOCIAL HISTORY: Social History   Social History  . Marital status: Married    Spouse name: N/A  . Number of children: 2  . Years of education: N/A    Occupational History  . Not on file.   Social History Main Topics  . Smoking status: Never Smoker  . Smokeless tobacco: Never Used  . Alcohol use No  . Drug use: No  . Sexual activity: Not on file   Other Topics Concern  . Not on file   Social History Narrative  . No narrative on file    FAMILY HISTORY: Family History  Problem Relation Age of Onset  . Cancer Father     prostate ca  . Cancer Sister     cancer unknown primary    ALLERGIES:  is allergic to beef extract; pork (porcine) protein; and beef-derived products.  MEDICATIONS:  Current Facility-Administered Medications  Medication Dose Route Frequency Provider Last Rate Last Dose  . 0.9 %  sodium chloride infusion   Intravenous Continuous Baxter Hire, MD 100 mL/hr at 10/22/16 1726    . acetaminophen (TYLENOL) tablet 650 mg  650 mg Oral Q6H PRN Srikar Sudini, MD      . dexamethasone (DECADRON) injection 4 mg  4 mg Intravenous Q6H Baxter Hire, MD   4 mg at 10/22/16 1726  . dronabinol (MARINOL) capsule 5 mg  5 mg Oral BID AC Baxter Hire, MD   5 mg at 10/22/16 1726  . feeding supplement (ENSURE ENLIVE) (ENSURE ENLIVE) liquid 237 mL  237 mL Oral BID BM Srikar Sudini, MD      . morphine 4 MG/ML injection 2 mg  2 mg Intravenous Q2H PRN Hillary Bow, MD   2 mg at 10/22/16 1726  . ondansetron (ZOFRAN) 8 mg in sodium chloride 0.9 %  50 mL IVPB  8 mg Intravenous Q6H Baxter Hire, MD   8 mg at 10/22/16 1725  . ondansetron (ZOFRAN) injection 4 mg  4 mg Intravenous Q6H PRN Srikar Sudini, MD      . oxyCODONE (Oxy IR/ROXICODONE) immediate release tablet 5 mg  5 mg Oral Q4H PRN Srikar Sudini, MD      . pantoprazole (PROTONIX) EC tablet 40 mg  40 mg Oral Daily Hillary Bow, MD   40 mg at 10/22/16 1726      .  PHYSICAL EXAMINATION:  Vitals:   10/22/16 0424 10/22/16 1346  BP: (!) 154/67 (!) 147/78  Pulse: (!) 56 63  Resp: 18 16  Temp: 98.2 F (36.8 C) 97.9 F (36.6 C)   Filed Weights   10/21/16 1455  10/21/16 1930  Weight: 123 lb (55.8 kg) 116 lb 11.2 oz (52.9 kg)    GENERAL: Well-nourished well-developed; Alert, no distress and comfortable.   Accompanied by multiple family members.  EYES: no pallor or icterus OROPHARYNX: no thrush or ulceration. NECK: supple, no masses felt LYMPH:  no palpable lymphadenopathy in the cervical, axillary or inguinal regions LUNGS: decreased breath sounds to auscultation at bases and  No wheeze or crackles HEART/CVS: regular rate & rhythm and no murmurs; No lower extremity edema ABDOMEN: abdomen soft, non-tender and normal bowel sounds Musculoskeletal:no cyanosis of digits and no clubbing  PSYCH: alert & oriented x 3 with fluent speech NEURO: 4-5 weakness of the right lower extremity ; 4 out of 5 weakness of the left upper extremity.  SKIN:  no rashes or significant lesions  LABORATORY DATA:  I have reviewed the data as listed Lab Results  Component Value Date   WBC 14.3 (H) 10/21/2016   HGB 13.3 10/21/2016   HCT 39.6 10/21/2016   MCV 94.6 10/21/2016   PLT 155 10/21/2016    Recent Labs  10/03/16 0841 10/10/16 1315 10/21/16 1458 10/22/16 0335  NA 134* 132* 127* 131*  K 4.4 3.7 4.4 4.6  CL 100* 100* 97* 104  CO2 25 24 19* 20*  GLUCOSE 123* 111* 117* 128*  BUN 14 13 17 13   CREATININE 0.52 0.56 0.48 0.40*  CALCIUM 8.9 8.8* 7.8* 7.5*  GFRNONAA >60 >60 >60 >60  GFRAA >60 >60 >60 >60  PROT 6.9 7.0 7.1  --   ALBUMIN 4.2 4.4 4.2  --   AST 46* 38 54*  --   ALT 32 29 64*  --   ALKPHOS 296* 262* 229*  --   BILITOT 0.3 0.6 1.0  --     RADIOGRAPHIC STUDIES: I have personally reviewed the radiological images as listed and agreed with the findings in the report. Ct Head Wo Contrast  Result Date: 10/21/2016 CLINICAL DATA:  Metastatic breast cancer. EXAM: CT HEAD WITHOUT CONTRAST TECHNIQUE: Contiguous axial images were obtained from the base of the skull through the vertex without intravenous contrast. COMPARISON:  MRI head 10/12/2016  FINDINGS: Brain: Progressive ventricular dilatation since the prior MRI. This is most consistent with hydrocephalus in this patient with metastatic disease and may be due to leptomeningeal tumor. No obstructing mass lesion. Multiple small metastatic deposits are seen throughout the brain on the MRI. These are not well seen on unenhanced CT however there is mild edema in the right occipital lobe and in the right frontal lobe over the convexity consistent with metastatic disease. Negative for hemorrhage. Vascular: Negative for dense MCA Skull: Patchy sclerotic disease in the clivus compatible with metastatic disease. No destructive  skull lesion. Sinuses/Orbits: Infiltration of the orbit with enlargement of the extra-ocular muscles most consistent with metastatic disease to the orbit as identified on MRI. Other: None IMPRESSION: Progressive ventricular dilatation since the recent MRI compatible with hydrocephalus. 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 Multiple brain metastasis better seen on MRI. No large obstructing mass lesion is identified. Negative for hemorrhage Metastatic disease to the clivus. Metastatic disease to the orbit bilaterally. Electronically Signed   By: Franchot Gallo M.D.   On: 10/21/2016 17:22   Ct Chest W Contrast  Result Date: 10/11/2016 CLINICAL DATA:  Metastatic breast cancer. EXAM: CT CHEST, ABDOMEN, AND PELVIS WITH CONTRAST TECHNIQUE: Multidetector CT imaging of the chest, abdomen and pelvis was performed following the standard protocol during bolus administration of intravenous contrast. CONTRAST:  159m ISOVUE-300 IOPAMIDOL (ISOVUE-300) INJECTION 61% COMPARISON:  08/29/2016 FINDINGS: CT CHEST FINDINGS Cardiovascular: The heart size appears normal. Aortic atherosclerosis noted. Mediastinum/Nodes: The trachea appears patent and is midline. Normal appearance of the esophagus. Right axillary node measures 9 mm, image number 18 of series 2.  Previously this measured the same. Interval decrease in soft tissue stranding within the right axilla which may be related to prior instrumentation. No enlarged mediastinal or hilar nodes. Lungs/Pleura: Small right pleural effusion. Mild diffuse bronchial wall thickening noted. Left upper lobe pulmonary nodule is unchanged measuring 4 mm, image 56 of series 4. Perifissural nodule in the left lower lobe is stable measuring 4 mm, image number 80 of series 4. No new or enlarging pulmonary nodules identified Musculoskeletal: Extensive, widespread sclerotic bone metastases again noted. Subjectively increased to previous exam. CT ABDOMEN PELVIS FINDINGS Hepatobiliary: Multifocal liver metastases again identified. Index lesion within the right lobe measures 2.6 cm, image 41 of series 2. Previously this measured the same. Lateral segment of left lobe of liver lesion measures 1.5 cm, image 49 of series 2. Previously 2.1 cm. The index lesion within the lateral right lobe of liver measures 2 cm, image 51 of series 2. Previously 2.3 cm. Index lesion within the inferior right lobe measures 2.4 cm, image 59 of series 2. Previously 2.6 cm. Gallbladder appears normal. No biliary dilatation. Pancreas: Normal appearance of the pancreas. Spleen: No change in small low density structure within the spleen measuring 6 mm, image 51 of series 2. Adrenals/Urinary Tract: The adrenal glands are normal. There is persistent bilateral pelvocaliectasis. No hydroureter. Enhancing nodule along the anterior wall of the bladder is again noted, image 81 89 of series 6. Stomach/Bowel: The stomach is normal. The small bowel loops have a normal caliber. No bowel obstruction. No pathologic dilatation of the colon. Increase scratch set there is mild thickening of the appendix which measures 8 mm in diameter, image 77 of series 2. No free fluid or fluid collections noted within in the right lower quadrant. Vascular/Lymphatic: Normal appearance of the  abdominal aorta. No enlarged upper abdominal lymph nodes. There is no pelvic or inguinal adenopathy. Reproductive: The uterus appears unremarkable. The right ovary measures 2.8 x 2.0 cm, image 89 of series 2. Previously 2.8 x 1.5 cm. Other: There is a small amount of free fluid identified within the pelvis. This is new from previous exam. Additionally, there is a suggestion subtle soft tissue nodularity within the peritoneal fat. For example, image number 64 of series 2 Musculoskeletal: Diffuse all osseous sclerosis compatible secondary to metastatic disease is again identified. IMPRESSION: 1. Index liver lesions are slightly decreased in size from previous exam. 2. Widespread sclerotic  bone metastasis as noted previously. Subjectively there may be an increase in the sclerosis which could reflect healing of bone metastases or progression of sclerotic bone metastases. 3. New ascites identified within the pelvis. Subtle areas of soft tissue nodularity within the peritoneum is concerning for development of peritoneal metastases. 4. Slight enlargement of the right ovary. Attention to the right ovary on follow-up imaging to monitor for potential ovarian metastases. Electronically Signed   By: Kerby Moors M.D.   On: 10/11/2016 11:59   Mr Jeri Cos NW Contrast  Result Date: 10/12/2016 CLINICAL DATA:  60 year old female with metastatic breast cancer. Staging. Subsequent encounter. EXAM: MRI HEAD WITHOUT AND WITH CONTRAST TECHNIQUE: Multiplanar, multiecho pulse sequences of the brain and surrounding structures were obtained without and with intravenous contrast. CONTRAST:  56m MULTIHANCE GADOBENATE DIMEGLUMINE 529 MG/ML IV SOLN COMPARISON:  PET-CT 11/14/2015. FINDINGS: Brain: Numerous small brain metastases. There are at least 15 small supratentorial metastases ranging from punctate to 10 mm diameter. There is also extensive metastatic disease affecting the cerebellum with widespread small areas of abnormal  enhancement, many of which are suspicious for leptomeningeal disease (series 16 images 14 and 15). The largest individual cerebellar lesions are 8-9 mm. Despite the extensive disease there is only occasional mild cerebral and cerebellar edema, and no significant intracranial mass effect. No hemorrhagic metastases. No abnormal ependymoma 1 enhancement. No pachymeningeal thickening or enhancement. No restricted diffusion or evidence of acute infarction. No ventriculomegaly. No acute intracranial hemorrhage identified. Negative pituitary. Negative cervicomedullary junction. No chronic cerebral blood products identified. There are small chronic lacunar infarcts in the left cerebellum. Subtle chronic cortical encephalomalacia in the right parietal lobe (series 9, image 21) compatible with previous small posterior right MCA infarct. Vascular: Major intracranial vascular flow voids are preserved, the distal left vertebral artery appears dominant. Skull and upper cervical spine: Diffuse abnormal bone marrow signal throughout the cervical spine and at the skullbase in keeping with widespread osseous metastatic disease. No destructive calvarial lesion identified. Negative visualized cervical spinal cord. Sinuses/Orbits: Abnormal heterogeneous appearance of the bilateral intraorbital soft tissues including the right lateral rectus muscle which is expanded in T2 hyperintense (series 14, image 20 and series 9, image 9). Similar heterogeneity throughout the left orbital soft tissues. There seems to be a mild degree of enophthalmos. The globes appear remain intact. Trace paranasal sinus mucosal thickening. Other: Mastoids are clear. Visible internal auditory structures appear normal. Negative scalp soft tissues. IMPRESSION: 1. Widespread metastatic disease to the brain. At least 15 small supratentorial metastases ranging from punctate to 10 mm diameter, and extensive cerebellar metastatic disease which appears highly suspicious  for leptomeningeal metastases. 2. Only mild scattered brain edema and no significant intracranial mass effect. 3. Bilateral orbit soft tissue metastases (scirrhous infiltration of the orbits). 4. Widespread osseous metastatic disease. Electronically Signed   By: HGenevie AnnM.D.   On: 10/12/2016 13:18   Ct Abdomen Pelvis W Contrast  Result Date: 10/11/2016 CLINICAL DATA:  Metastatic breast cancer. EXAM: CT CHEST, ABDOMEN, AND PELVIS WITH CONTRAST TECHNIQUE: Multidetector CT imaging of the chest, abdomen and pelvis was performed following the standard protocol during bolus administration of intravenous contrast. CONTRAST:  1094mISOVUE-300 IOPAMIDOL (ISOVUE-300) INJECTION 61% COMPARISON:  08/29/2016 FINDINGS: CT CHEST FINDINGS Cardiovascular: The heart size appears normal. Aortic atherosclerosis noted. Mediastinum/Nodes: The trachea appears patent and is midline. Normal appearance of the esophagus. Right axillary node measures 9 mm, image number 18 of series 2. Previously this measured the same. Interval decrease in soft tissue  stranding within the right axilla which may be related to prior instrumentation. No enlarged mediastinal or hilar nodes. Lungs/Pleura: Small right pleural effusion. Mild diffuse bronchial wall thickening noted. Left upper lobe pulmonary nodule is unchanged measuring 4 mm, image 56 of series 4. Perifissural nodule in the left lower lobe is stable measuring 4 mm, image number 80 of series 4. No new or enlarging pulmonary nodules identified Musculoskeletal: Extensive, widespread sclerotic bone metastases again noted. Subjectively increased to previous exam. CT ABDOMEN PELVIS FINDINGS Hepatobiliary: Multifocal liver metastases again identified. Index lesion within the right lobe measures 2.6 cm, image 41 of series 2. Previously this measured the same. Lateral segment of left lobe of liver lesion measures 1.5 cm, image 49 of series 2. Previously 2.1 cm. The index lesion within the lateral right  lobe of liver measures 2 cm, image 51 of series 2. Previously 2.3 cm. Index lesion within the inferior right lobe measures 2.4 cm, image 59 of series 2. Previously 2.6 cm. Gallbladder appears normal. No biliary dilatation. Pancreas: Normal appearance of the pancreas. Spleen: No change in small low density structure within the spleen measuring 6 mm, image 51 of series 2. Adrenals/Urinary Tract: The adrenal glands are normal. There is persistent bilateral pelvocaliectasis. No hydroureter. Enhancing nodule along the anterior wall of the bladder is again noted, image 81 89 of series 6. Stomach/Bowel: The stomach is normal. The small bowel loops have a normal caliber. No bowel obstruction. No pathologic dilatation of the colon. Increase scratch set there is mild thickening of the appendix which measures 8 mm in diameter, image 77 of series 2. No free fluid or fluid collections noted within in the right lower quadrant. Vascular/Lymphatic: Normal appearance of the abdominal aorta. No enlarged upper abdominal lymph nodes. There is no pelvic or inguinal adenopathy. Reproductive: The uterus appears unremarkable. The right ovary measures 2.8 x 2.0 cm, image 89 of series 2. Previously 2.8 x 1.5 cm. Other: There is a small amount of free fluid identified within the pelvis. This is new from previous exam. Additionally, there is a suggestion subtle soft tissue nodularity within the peritoneal fat. For example, image number 64 of series 2 Musculoskeletal: Diffuse all osseous sclerosis compatible secondary to metastatic disease is again identified. IMPRESSION: 1. Index liver lesions are slightly decreased in size from previous exam. 2. Widespread sclerotic bone metastasis as noted previously. Subjectively there may be an increase in the sclerosis which could reflect healing of bone metastases or progression of sclerotic bone metastases. 3. New ascites identified within the pelvis. Subtle areas of soft tissue nodularity within the  peritoneum is concerning for development of peritoneal metastases. 4. Slight enlargement of the right ovary. Attention to the right ovary on follow-up imaging to monitor for potential ovarian metastases. Electronically Signed   By: Kerby Moors M.D.   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 Donella Stade. Continue radiation. Given the new onset of right lower extremity weakness/ left upper extremity weakness- recommend MRI with contrast- thoracic lumbar spine. Continue dexamethasone.  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 Discussed with the patient and family- my concerns regarding progressive visceral disease. Patient will need a  Mediport for Adriamycin-based chemotherapy; discussed with Dr. Faith Rogue. Check CBC PT PTT.  # Poor by mouth intake/dehydration- improved with IV fluids.  # Overall poor prognosis.   # Above recommendations reviewed with Dr. Darvin Neighbours.Also discussed with Dr.Crystal.  Thank you Dr. Darvin Neighbours for allowing me to participate in the care of your pleasant patient. Please do not hesitate to contact me with questions or concerns in the interim.  All questions were answered. The patient knows to call the clinic with any problems, questions or concerns.   Cammie Sickle, MD 10/22/2016 6:05 PM

## 2016-10-22 NOTE — Progress Notes (Signed)
Initial Nutrition Assessment  DOCUMENTATION CODES:   Severe malnutrition in context of chronic illness  INTERVENTION:  Provide Ensure Enlive po BID, each supplement provides 350 kcal and 20 grams of protein.  Provided education on High-Calorie, High-Protein Medical Nutrition Therapy and Nausea/Vomiting Medical Nutrition Therapy for patient and family. Encouraged small, frequent meals of foods that will be well-tolerated.  NUTRITION DIAGNOSIS:   Inadequate oral intake related to poor appetite, nausea, vomiting as evidenced by per patient/family report, percent weight loss.  GOAL:   Patient will meet greater than or equal to 90% of their needs  MONITOR:   PO intake, Supplement acceptance, Labs, Weight trends, I & O's  REASON FOR ASSESSMENT:   Malnutrition Screening Tool    ASSESSMENT:   60 year old female who has a history of metastatic breast cancer. She has metastasis to bone and liver and now to brain. She is to undergo radiation therapy for the brain metastases 11/13. She is recently started on some Decadron. She's now developed intractable nausea has been unable to take medications consistently including pain medications. She appears weak and unable to eat. Possible pancreatitis in setting of intractable nausea and elevated lipase.   Spoke with patient and daughter at bedside. Patient reports poor appetite for weeks in setting of nausea/vomiting. Denies abdominal pain or difficulty chewing/swallowing. Reports eating only 1-2 meals per day, usually consisting of grits, toast with peanut butter and honey, potato soup, or boiled eggs. Eating small amounts.  UBW 142-143 lbs. Patient has lost 29 lbs (20% body weight) over 4 months, which is significant for time frame.  Medications reviewed and include: dexamethasone, Marinol 5 mg BID, Zofran 8 mg Q6hrs, pantoprazole, NS @ 100 ml/hr.  Labs reviewed: Sodium 131, BUN 20, Creatinine 0.4, Glucose 128, Lipase 139.   Nutrition-Focused  physical exam completed. Findings are mild-moderate fat depletion, mild-moderate muscle depletion, and no edema.   Patient meets criteria for severe chronic malnutrition in setting of 20% weight loss over 4 months, intake </= 75% of estimated energy requirement for >/= 1 month.  Diet Order:  DIET SOFT Room service appropriate? Yes; Fluid consistency: Thin  Skin:  Reviewed, no issues  Last BM:  10/19/2016  Height:   Ht Readings from Last 1 Encounters:  10/21/16 5\' 1"  (1.549 m)    Weight:   Wt Readings from Last 1 Encounters:  10/21/16 116 lb 11.2 oz (52.9 kg)    Ideal Body Weight:  47.72 kg  BMI:  Body mass index is 22.05 kg/m.  Estimated Nutritional Needs:   Kcal:  1585-1850 (30-35 kcal/kg)  Protein:  80-90 grams (1.5-1.7 grams/kg)  Fluid:  >/= 1.5 L/day  EDUCATION NEEDS:   Education needs addressed (High-Calorie High-Protein MNT, Nausea/Vomiting MNT)  Willey Blade, MS, RD, LDN Pager: [phone] After Hours Pager: [phone]

## 2016-10-22 NOTE — Telephone Encounter (Signed)
Spoke to Dr.Crystal re: pt's admission/progressive brain findings. Will plan to continue RT.

## 2016-10-22 NOTE — Progress Notes (Signed)
Chaplain was making his rounds and visited with pt in room 116. Provided the ministry of prayer and a spiritual presence.    10/22/16 1501  Clinical Encounter Type  Visited With Patient  Visit Type Initial  Spiritual Encounters  Spiritual Needs Prayer

## 2016-10-22 NOTE — Progress Notes (Signed)
Chaplain visited the patient along with the chaplain assigned at this unit. Chaplains prayed for the patient.

## 2016-10-22 NOTE — Progress Notes (Signed)
[facility] NAME: Elizabeth Mccoy    MR#:  [id number]  DATE OF BIRTH:  11-Oct-1956  SUBJECTIVE:  CHIEF COMPLAINT:   Chief Complaint  Patient presents with  . Nausea  . Dizziness   Still has nausea. Right lower extremity weakness for 2 days.  REVIEW OF SYSTEMS:    Review of Systems  Constitutional: Positive for malaise/fatigue. Negative for chills and fever.  HENT: Negative for sore throat.   Eyes: Negative for blurred vision, double vision and pain.  Respiratory: Negative for cough, hemoptysis, shortness of breath and wheezing.   Cardiovascular: Negative for chest pain, palpitations, orthopnea and leg swelling.  Gastrointestinal: Positive for heartburn, nausea and vomiting. Negative for abdominal pain, constipation and diarrhea.  Genitourinary: Negative for dysuria and hematuria.  Musculoskeletal: Negative for back pain and joint pain.  Skin: Negative for rash.  Neurological: Positive for dizziness, sensory change, focal weakness and weakness. Negative for speech change and headaches.  Endo/Heme/Allergies: Does not bruise/bleed easily.  Psychiatric/Behavioral: Negative for depression. The patient is not nervous/anxious.     DRUG ALLERGIES:   Allergies  Allergen Reactions  . Beef Extract Anaphylaxis  . Pork (Porcine) Protein Anaphylaxis  . Beef-Derived Products Hives    VITALS:  Blood pressure (!) 154/67, pulse (!) 56, temperature 98.2 F (36.8 C), temperature source Oral, resp. rate 18, height 5\' 1"  (1.549 m), weight 52.9 kg (116 lb 11.2 oz), SpO2 99 %.  PHYSICAL EXAMINATION:   Physical Exam  GENERAL:  60 y.o.-year-old patient lying in the bed with no acute distress.  EYES: Pupils equal, round, reactive to light and accommodation. No scleral icterus. Extraocular muscles intact.  HEENT: Head atraumatic, normocephalic. Oropharynx and nasopharynx clear.  NECK:  Supple, no jugular venous distention. No thyroid  enlargement, no tenderness.  LUNGS: Normal breath sounds bilaterally, no wheezing, rales, rhonchi. No use of accessory muscles of respiration.  CARDIOVASCULAR: S1, S2 normal. No murmurs, rubs, or gallops.  ABDOMEN: Soft, nontender, nondistended. Bowel sounds present. No organomegaly or mass.  EXTREMITIES: No cyanosis, clubbing or edema b/l.    NEUROLOGIC: Cranial nerves II through XII are intact. Right lower extremity motor strength 4/5. Other areas 5/5  PSYCHIATRIC: The patient is alert and oriented x 3.  SKIN: No obvious rash, lesion, or ulcer.   LABORATORY PANEL:   CBC  Recent Labs Lab 10/21/16 1458  WBC 14.3*  HGB 13.3  HCT 39.6  PLT 155   ------------------------------------------------------------------------------------------------------------------ Chemistries   Recent Labs Lab 10/21/16 1458 10/22/16 0335  NA 127* 131*  K 4.4 4.6  CL 97* 104  CO2 19* 20*  GLUCOSE 117* 128*  BUN 17 13  CREATININE 0.48 0.40*  CALCIUM 7.8* 7.5*  MG 2.4  --   AST 54*  --   ALT 64*  --   ALKPHOS 229*  --   BILITOT 1.0  --    ------------------------------------------------------------------------------------------------------------------  Cardiac Enzymes No results for input(s): TROPONINI in the last 168 hours. ------------------------------------------------------------------------------------------------------------------  RADIOLOGY:  Ct Head Wo Contrast  Result Date: 10/21/2016 CLINICAL DATA:  Metastatic breast cancer. EXAM: CT HEAD WITHOUT CONTRAST TECHNIQUE: Contiguous axial images were obtained from the base of the skull through the vertex without intravenous contrast. COMPARISON:  MRI head 10/12/2016 FINDINGS: Brain: Progressive ventricular dilatation since the prior MRI. This is most consistent with hydrocephalus in this patient with metastatic disease and may be due to leptomeningeal tumor. No obstructing mass lesion. Multiple small metastatic deposits are seen  throughout the  brain on the MRI. These are not well seen on unenhanced CT however there is mild edema in the right occipital lobe and in the right frontal lobe over the convexity consistent with metastatic disease. Negative for hemorrhage. Vascular: Negative for dense MCA Skull: Patchy sclerotic disease in the clivus compatible with metastatic disease. No destructive skull lesion. Sinuses/Orbits: Infiltration of the orbit with enlargement of the extra-ocular muscles most consistent with metastatic disease to the orbit as identified on MRI. Other: None IMPRESSION: Progressive ventricular dilatation since the recent MRI compatible with hydrocephalus. 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 Multiple brain metastasis better seen on MRI. No large obstructing mass lesion is identified. Negative for hemorrhage Metastatic disease to the clivus. Metastatic disease to the orbit bilaterally. Electronically Signed   By: Franchot Gallo M.D.   On: 10/21/2016 17:22     ASSESSMENT AND PLAN:   * Intractable nausea and vomiting Likely from liver metastases. Patient also undergoing chemotherapy. Improving. Continue Zofran. Advance diet to soft diet. Oncology consulted.  * Elevated lipase No abdominal tenderness. Lipase has returned to normal. Possible pancreatitis but CT scan normal. Monitor.  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 She does have new onset right lower extremity weakness likely from brain metastases. Undergoing radiation therapy later today. Consult physical therapy. Waiting on oncology input. Seems to have poor prognosis.  * Hypovolemic hyponatremia is improving with IV fluids  All the records are reviewed and case discussed with Care Management/Social Workerr. Management plans discussed with the patient, family and they are in agreement.  CODE STATUS: FULL CODE  DVT Prophylaxis:  SCDs  TOTAL TIME TAKING CARE OF THIS PATIENT: 35 minutes.   POSSIBLE D/C IN 1-2 DAYS, DEPENDING ON CLINICAL CONDITION.  Hillary Bow R M.D on 10/22/2016 at 11:35 AM  Between 7am to 6pm - Pager - [phone]  After 6pm go to [email or web address] - password [email or web address] [facility]  Office  [phone]  CC: Primary care physician; Gerilyn Pilgrim, FNP  Note: This dictation was prepared with Dragon dictation along with smaller phrase technology. Any transcriptional errors that result from this process are unintentional.

## 2016-10-22 NOTE — Progress Notes (Signed)
SLP Cancellation Note  Patient Details Name: Elizabeth Mccoy MRN: [id number] DOB: 1956/03/01   Cancelled treatment:       Reason Eval/Treat Not Completed: Patient declined, no reason specified (Pt stated s/s have resolved). Pt was leaving for tx at the [facility] following ST screening. After lunch meal Pt had told nursing she felt "she had a green bean stuck in her throat". Nursing consulted ST services immediately. Upon ST services entering room she stated "Its gone now, its all fine". Pt and family were educated on general aspiration precautions. Recommend meds be given whole in puree if difficulty swallowing. ST services are available for additional education and evaluation if decline in status during admission. Nursing, Pt, and Family agree.    Rivka Safer, B.A. Clinical Graduate Student 10/22/2016, 1:17 PM

## 2016-10-22 NOTE — Evaluation (Addendum)
Physical Therapy Evaluation Patient Details Name: Elizabeth Mccoy MRN: [id number] DOB: [birth year] Today's Date: 10/22/2016   History of Present Illness  This is a 60 year old female who has a history of metastatic breast cancer. She has metastasis to bone and liver and now to brain. She is recently started on some Decadron. She's now developed intractable nausea has been unable to take medications consistently including pain medications. She appears weak and unable to eat. Pt reports new onset LUE and bilateral LE weakness. LLE has improved since yesterday but RLE weakness persists. She is also complaining of some R facial numbness in addition to R anterolateral thigh numbness. Per medical notes these deficits are believed to be related to brain metastesis.  Clinical Impression  Pt admitted with above diagnosis. Pt currently with functional limitations due to the deficits listed below (see PT Problem List).  Pt with significant LUE and RLE weakness (see extremity/trunk assessment) which is relatively acute in onset. Per medical notes this is presumed to be secondary to metastatic breast cancer with new lesions to the brain. Pt with significant RLE buckling in standing and with ambulation. She has difficulty with UE support due to LUE weakness. Pt is unsafe during ambulation and is very limited in how far she can ambulate. Recommend HH PT at discharge to help with safety strategies and maintaining as much strength as possible. Do not see where SNF placement would be beneficial for patient and patient/family not currently interested in pursuing this option. She needs 24/7 support at home and will need constant guarding for all transfers and ambulation due to high risk of falls. Recommend family has access to a gait belt for this. Daughter reports that they have access to [facility] and rolling walker but if they cannot secure they will need this at discharge. Pt may need assistance entering home at discharge  as she has 2 steps she will need to ascend. Unsafe to attempt stairs today but will try in future session. Otherwise pt may require considerable support from family and/or ramp. Pt will benefit from skilled PT services to address deficits in strength, balance, and mobility in order to return to full function at home.     Follow Up Recommendations Home health PT;Supervision/Assistance - 24 hour;Other (comment) (guarding with gait belt for all transfers/ambulation)    Equipment Recommendations  Other (comment) (gait belt, may need rolling walker and BSC if unavailable)    Recommendations for Other Services       Precautions / Restrictions Precautions Precautions: Fall Restrictions Weight Bearing Restrictions: No      Mobility  Bed Mobility Overal bed mobility: Independent             General bed mobility comments: Fair speed and sequencing noted with HOB elevated  Transfers Overall transfer level: Needs assistance Equipment used: Rolling walker (2 wheeled) Transfers: Sit to/from Stand Sit to Stand: Min assist         General transfer comment: Pt requires minA for balance during sit to stand transfers. Posterior LOB and decreased bilateral LE strength noted. Heavy bilateral UE reliance  Ambulation/Gait Ambulation/Gait assistance: Min assist Ambulation Distance (Feet): 15 Feet Assistive device: Rolling walker (2 wheeled) Gait Pattern/deviations: Decreased step length - right;Decreased step length - left Gait velocity: Decreased Gait velocity interpretation: <1.8 ft/sec, indicative of risk for recurrent falls General Gait Details: Pt ambulates from bed to bathroom with heavy UE support on walker and RLE buckling noted. Able to take a few additional steps at EOB but almost  falls due to RLE buckling, fatigue, and weakness. Very limited in ambulation and difficulty with UE support due to LUE weakness as well. Educated about safety at home following discharge  Stairs             Wheelchair Mobility    Modified Rankin (Stroke Patients Only)       Balance Overall balance assessment: Needs assistance Sitting-balance support: No upper extremity supported Sitting balance-Leahy Scale: Good     Standing balance support: No upper extremity supported Standing balance-Leahy Scale: Fair Standing balance comment: Able to maintain wide and narrow stance without UE support but decreased weight shifting to RLE. Positive Rhomberg. Unable to lift LLE from floor due to RLE weakness. R knee hyperextension compensatory strategy noted when attempting to lift LLE                             Pertinent Vitals/Pain Pain Assessment: No/denies pain    Home Living Family/patient expects to be discharged to:: Private residence Living Arrangements: Spouse/significant other Available Help at Discharge: Family;Available 24 hours/day Type of Home: House Home Access: Stairs to enter Entrance Stairs-Rails: Can reach both;Right;Left Entrance Stairs-Number of Steps: 2 Home Layout: Able to live on main level with bedroom/bathroom;Multi-level;Other (Comment) (Also one step-down to living room) Home Equipment: Cane - quad;Bedside commode;Grab bars - tub/shower;Tub bench Additional Comments: May have access to rolling walker but will check. Daughter getting tub bench. Daughter reports they have access to [facility].    Prior Function Level of Independence: Independent               Hand Dominance        Extremity/Trunk Assessment   Upper Extremity Assessment: LUE deficits/detail       LUE Deficits / Details: Pt with 1+/5 L shoulder flexion, 2+/5 L elbow flexion and extension with decreased grip strength. RUE strength appears grossly WFL at least 4- to 4/5 throughout. Reports intact sensation to bilateral UEs   Lower Extremity Assessment: RLE deficits/detail RLE Deficits / Details: Pt reports initially bilateral LE weakness but improving LLE strength. RLE is  grossly weak with 2/5 R hip flexion, 4-/5 R knee flexion/extension with buckling during extension MMT. 2/5 R ankle DF. Reports decreased sensation to R anterolateral thigh to light touch. Sensation symmetrical and intact bilaterally below the knees       Communication   Communication: No difficulties  Cognition Arousal/Alertness: Awake/alert Behavior During Therapy: WFL for tasks assessed/performed Overall Cognitive Status: Within Functional Limits for tasks assessed                      General Comments      Exercises     Assessment/Plan    PT Assessment Patient needs continued PT services  PT Problem List Decreased strength;Decreased activity tolerance;Decreased balance;Decreased mobility;Decreased safety awareness;Decreased knowledge of use of DME;Impaired sensation          PT Treatment Interventions DME instruction;Gait training;Stair training;Functional mobility training;Therapeutic exercise;Balance training;Neuromuscular re-education;Therapeutic activities;Wheelchair mobility training;Patient/family education    PT Goals (Current goals can be found in the Care Plan section)  Acute Rehab PT Goals Patient Stated Goal: "I want to go back home" PT Goal Formulation: With patient/family Time For Goal Achievement: 11/05/16 Potential to Achieve Goals: Poor    Frequency Min 2X/week   Barriers to discharge Inaccessible home environment 2 steps to enter, unclear if patient would be able to perform    Co-evaluation  End of Session Equipment Utilized During Treatment: Gait belt Activity Tolerance: Patient tolerated treatment well Patient left: in bed;with call bell/phone within reach;with bed alarm set;with family/visitor present Nurse Communication: Other (comment) (Care manager notified of discharge recommendations)         Time: [id number] PT Time Calculation (min) (ACUTE ONLY): 20 min   Charges:   PT Evaluation $PT Eval Moderate  Complexity: 1 Procedure     PT G Codes:       Lyndel Safe Broden Holt PT, DPT   Hafiz Irion 10/22/2016, 4:09 PM

## 2016-10-23 ENCOUNTER — Ambulatory Visit
Admission: RE | Admit: 2016-10-23 | Discharge: 2016-10-23 | Disposition: A | Discharge status: Home / Self Care | Payer: BLUE CROSS/BLUE SHIELD | Source: Ambulatory Visit | Attending: Radiation Oncology | Admitting: Radiation Oncology

## 2016-10-23 ENCOUNTER — Ambulatory Visit: Payer: BLUE CROSS/BLUE SHIELD

## 2016-10-23 ENCOUNTER — Inpatient Hospital Stay: Payer: BLUE CROSS/BLUE SHIELD

## 2016-10-23 DIAGNOSIS — C50812 Malignant neoplasm of overlapping sites of left female breast: Secondary | ICD-10-CM | POA: Diagnosis not present

## 2016-10-23 DIAGNOSIS — C799 Secondary malignant neoplasm of unspecified site: Secondary | ICD-10-CM

## 2016-10-23 DIAGNOSIS — E43 Unspecified severe protein-calorie malnutrition: Secondary | ICD-10-CM | POA: Insufficient documentation

## 2016-10-23 LAB — COMPREHENSIVE METABOLIC PANEL
ALT: 81 U/L — AB (ref 14–54)
AST: 55 U/L — AB (ref 15–41)
Albumin: 3.7 g/dL (ref 3.5–5.0)
Alkaline Phosphatase: 164 U/L — ABNORMAL HIGH (ref 38–126)
Anion gap: 8 (ref 5–15)
BILIRUBIN TOTAL: 0.8 mg/dL (ref 0.3–1.2)
BUN: 12 mg/dL (ref 6–20)
CO2: 21 mmol/L — ABNORMAL LOW (ref 22–32)
CREATININE: 0.4 mg/dL — AB (ref 0.44–1.00)
Calcium: 7.4 mg/dL — ABNORMAL LOW (ref 8.9–10.3)
Chloride: 104 mmol/L (ref 101–111)
GFR calc Af Amer: >60 mL/min (ref >60)
Glucose, Bld: 135 mg/dL — ABNORMAL HIGH (ref 65–99)
Potassium: 4.4 mmol/L (ref 3.5–5.1)
Sodium: 133 mmol/L — ABNORMAL LOW (ref 135–145)
TOTAL PROTEIN: 6.3 g/dL — AB (ref 6.5–8.1)

## 2016-10-23 MED ORDER — FAMOTIDINE IN NACL 20-0.9 MG/50ML-% IV SOLN
20.0000 mg | Freq: Two times a day (BID) | INTRAVENOUS | Status: DC
  Administered 2016-10-23 – 2016-10-30 (×16): 20 mg via INTRAVENOUS
  Filled 2016-10-23 (×19): qty 50

## 2016-10-23 MED ORDER — CEFAZOLIN SODIUM-DEXTROSE 2-4 GM/100ML-% IV SOLN
2.0000 g | INTRAVENOUS | Status: DC
  Filled 2016-10-23: qty 100

## 2016-10-23 MED ORDER — SENNOSIDES-DOCUSATE SODIUM 8.6-50 MG PO TABS
1.0000 | ORAL_TABLET | Freq: Two times a day (BID) | ORAL | Status: DC
  Administered 2016-10-23 – 2016-10-26 (×4): 1 via ORAL
  Filled 2016-10-23 (×5): qty 1

## 2016-10-23 MED ORDER — GADOBENATE DIMEGLUMINE 529 MG/ML IV SOLN
10.0000 mL | Freq: Once | INTRAVENOUS | Status: AC | PRN
  Administered 2016-10-23: 10 mL via INTRAVENOUS

## 2016-10-23 NOTE — Progress Notes (Signed)
  Patient ID: Elizabeth Mccoy, female   DOB: [birth year], 60 y.o.   MRN: [id number]  HISTORY: Patient known to me.  Was scheduled to have a port placed for chemotherapy but placed on hold while she was being evaluated for brain metastases.  Admitted with facial numbness and dysarthria, LUE and RLE weakness.  Not eating well.  All symptoms have improved with the introduction of therapy (whole brain RT and steroids as well as hydration).  Currently she still has some of the same symptoms but all are improving.   Vitals:   10/22/16 2013 10/23/16 0425  BP: (!) 145/73 130/65  Pulse: 66 63  Resp: 19 18  Temp: 98.2 F (36.8 C) 97.7 F (36.5 C)     EXAM:    Resp: Lungs are clear bilaterally.  No respiratory distress, normal effort. Heart:  Regular without murmurs Abd:  Abdomen is soft, non distended and non tender. No masses are palpable.  There is no rebound and no guarding.  Neurological: Alert and oriented to person, place, and time. Coordination normal.  Skin: Skin is warm and dry. No rash noted. No diaphoretic. No erythema. No pallor.  Psychiatric: Normal mood and affect. Normal behavior. Judgment and thought content normal.    ASSESSMENT: I was asked by Dr. Fabienne Bruns to place the PortACath on this admission and I will arrange to do so.  She is in agreement and would like this done prior to discharge.  All questions answered.    PLAN:   Will schedule PortACath ASAP. No anticoagulants     Nestor Lewandowsky, MD

## 2016-10-23 NOTE — Progress Notes (Signed)
PT Cancellation Note  Patient Details Name: Elizabeth Mccoy MRN: [id number] DOB: 1956-12-07   Cancelled Treatment:    Reason Eval/Treat Not Completed: Patient at procedure or test/unavailable. Treatment attempted this afternoon and pt unavailable; pt currently at cancer center. Re attempt treatment tomorrow.    Larae Grooms, PTA 10/23/2016, 3:11 PM

## 2016-10-23 NOTE — Progress Notes (Signed)
While rounding, [place] made initial visit to room 116. Pt presented reasonably calm and conversational. Sister-In-Law was bedside. We engaged in conversation about her home town [facility]). Pt became briefly emotional when she spoke of her son and her desire to take care of him. (The son is 23) Pt requested prayer that her treatments would take care of the cancer she is fighting. CH provided the ministry of prayer, empathetic listening, and presence. CH is available for follow up as needed.    10/23/16 1300  Clinical Encounter Type  Visited With Patient;Patient and family together;Health care provider  Visit Type Initial;Spiritual support  Referral From Nurse  Spiritual Encounters  Spiritual Needs Prayer

## 2016-10-23 NOTE — Progress Notes (Signed)
Dr Vianne Bulls notified of results  Of called report from [place].  Of  Mri lumbar spine

## 2016-10-23 NOTE — Progress Notes (Signed)
[facility] at [place] NAME: Elizabeth Mccoy    MR#:  [id number]  DATE OF BIRTH:  [birth year]  SUBJECTIVE: Patient says that she has no further vomiting, but has some nausea, able to keep food down. She is still has weakness on the right side upper and lower extremities. Going for MRI of thoracic and lumbar spine today. C/o heart burn .  CHIEF COMPLAINT:   Chief Complaint  Patient presents with  . Nausea  . Dizziness   Still has nausea.Marland Kitchen  REVIEW OF SYSTEMS:    Review of Systems  Constitutional: Positive for malaise/fatigue. Negative for chills and fever.  HENT: Negative for sore throat.   Eyes: Negative for blurred vision, double vision and pain.  Respiratory: Negative for cough, hemoptysis, shortness of breath and wheezing.   Cardiovascular: Negative for chest pain, palpitations, orthopnea and leg swelling.  Gastrointestinal: Positive for heartburn, nausea and vomiting. Negative for abdominal pain, constipation and diarrhea.  Genitourinary: Negative for dysuria and hematuria.  Musculoskeletal: Negative for back pain and joint pain.  Skin: Negative for rash.  Neurological: Positive for dizziness, sensory change, focal weakness and weakness. Negative for speech change and headaches.  Endo/Heme/Allergies: Does not bruise/bleed easily.  Psychiatric/Behavioral: Negative for depression. The patient is not nervous/anxious.     DRUG ALLERGIES:   Allergies  Allergen Reactions  . Beef Extract Anaphylaxis  . Pork (Porcine) Protein Anaphylaxis  . Beef-Derived Products Hives    VITALS:  Blood pressure 130/65, pulse 63, temperature 97.7 F (36.5 C), temperature source Oral, resp. rate 18, height 5\' 1"  (1.549 m), weight 52.9 kg (116 lb 11.2 oz), SpO2 99 %.  PHYSICAL EXAMINATION:   Physical Exam  GENERAL:  60 y.o.-year-old patient lying in the bed with no acute distress.  EYES: Pupils equal, round, reactive to light and accommodation. No  scleral icterus. Extraocular muscles intact.  HEENT: Head atraumatic, normocephalic. Oropharynx and nasopharynx clear.  NECK:  Supple, no jugular venous distention. No thyroid enlargement, no tenderness.  LUNGS: Normal breath sounds bilaterally, no wheezing, rales, rhonchi. No use of accessory muscles of respiration.  CARDIOVASCULAR: S1, S2 normal. No murmurs, rubs, or gallops.  ABDOMEN: Soft, nontender, nondistended. Bowel sounds present. No organomegaly or mass.  EXTREMITIES: No cyanosis, clubbing or edema b/l.    NEUROLOGIC: Cranial nerves II through XII are intact. Right lower extremity motor strength 4/5. Other areas 5/5  PSYCHIATRIC: The patient is alert and oriented x 3.  SKIN: No obvious rash, lesion, or ulcer.   LABORATORY PANEL:   CBC  Recent Labs Lab 10/22/16 1936  WBC 19.2*  HGB 12.7  HCT 37.5  PLT 143*   ------------------------------------------------------------------------------------------------------------------ Chemistries   Recent Labs Lab 10/21/16 1458  10/23/16 0457  NA 127*  < > 133*  K 4.4  < > 4.4  CL 97*  < > 104  CO2 19*  < > 21*  GLUCOSE 117*  < > 135*  BUN 17  < > 12  CREATININE 0.48  < > 0.40*  CALCIUM 7.8*  < > 7.4*  MG 2.4  --   --   AST 54*  --  55*  ALT 64*  --  81*  ALKPHOS 229*  --  164*  BILITOT 1.0  --  0.8  < > = values in this interval not displayed. ------------------------------------------------------------------------------------------------------------------  Cardiac Enzymes No results for input(s): TROPONINI in the last 168 hours. ------------------------------------------------------------------------------------------------------------------  RADIOLOGY:  Ct Head Wo Contrast  Result Date: 10/21/2016 CLINICAL  DATA:  Metastatic breast cancer. EXAM: CT HEAD WITHOUT CONTRAST TECHNIQUE: Contiguous axial images were obtained from the base of the skull through the vertex without intravenous contrast. COMPARISON:  MRI head  10/12/2016 FINDINGS: Brain: Progressive ventricular dilatation since the prior MRI. This is most consistent with hydrocephalus in this patient with metastatic disease and may be due to leptomeningeal tumor. No obstructing mass lesion. Multiple small metastatic deposits are seen throughout the brain on the MRI. These are not well seen on unenhanced CT however there is mild edema in the right occipital lobe and in the right frontal lobe over the convexity consistent with metastatic disease. Negative for hemorrhage. Vascular: Negative for dense MCA Skull: Patchy sclerotic disease in the clivus compatible with metastatic disease. No destructive skull lesion. Sinuses/Orbits: Infiltration of the orbit with enlargement of the extra-ocular muscles most consistent with metastatic disease to the orbit as identified on MRI. Other: None IMPRESSION: Progressive ventricular dilatation since the recent MRI compatible with hydrocephalus. 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 Multiple brain metastasis better seen on MRI. No large obstructing mass lesion is identified. Negative for hemorrhage Metastatic disease to the clivus. Metastatic disease to the orbit bilaterally. Electronically Signed   By: Franchot Gallo M.D.   On: 10/21/2016 17:22     ASSESSMENT AND PLAN:   * Intractable nausea and vomiting Likely from liver metastases. Patient also undergoing chemotherapy. Improving. Continue Zofran. Advanced diet to soft diet. requesting IV medicine for heartburn. Started on IV Pepcid. Oncology consulted.  * Elevated lipase No abdominal tenderness. Lipase has returned to normal. Possible pancreatitis but CT scan normal. Monitor.  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 Continue Decadron, radiation  therapy, patient is a new port for chemotherapy, Dr. Genevive Bi will do the port placement tomorrow Because of the metastatic breast cancer with metastases to bone, liver, brain prognosis is very poor. Family is aware.  She does have new onset right lower extremity weakness likely from brain metastases. Physical therapy  recommended home health physical therapy.  * Hypovolemic hyponatremia; improved, decreased IV fluids.  All the records are reviewed and case discussed with Care Management/Social Workerr. Management plans discussed with the patient, family and they are in agreement.  CODE STATUS: FULL CODE  DVT Prophylaxis: SCDs  TOTAL TIME TAKING CARE OF THIS PATIENT: 35 minutes.   POSSIBLE D/C IN 1-2 DAYS, DEPENDING ON CLINICAL CONDITION.  Epifanio Lesches M.D on 10/23/2016 at 9:49 AM  Between 7am to 6pm - Pager - [phone]  After 6pm go to [email or web address] - password [email or web address] [facility]  Office  [phone]  CC: Primary care physician; Gerilyn Pilgrim, FNP  Note: This dictation was prepared with Dragon dictation along with smaller phrase technology. Any transcriptional errors that result from this process are unintentional.

## 2016-10-23 NOTE — Progress Notes (Signed)
PT Cancellation Note  Patient Details Name: Elizabeth Mccoy MRN: [id number] DOB: [birth year]   Cancelled Treatment:    Reason Eval/Treat Not Completed: Patient at procedure or test/unavailable. Treatment attempted; pt leaving for test/procedure. Re attempt this afternoon.    Larae Grooms, PTA 10/23/2016, 1:50 PM

## 2016-10-23 NOTE — Care Management (Addendum)
Admitted to [facility] with the diagnosis of intractable nausea/vomitting. Lives with husband, Elizabeth Mccoy, [phone]). Sister/caregiver is Kerin Ransom [phone] or [phone]). Last seen Dr. Louanne Skye a year ago. No home health. No skilled facility. No home oxygen. Prescriptions are filled at [facility].  No falls, Decreased appetite. Lost 14 pounds +. No equipment in the home. Takes care of basic needs herself. Sister is in the home during the day while husband works. Physical therapy evaluation completed. Recommending home with home health/physical therapy. Discussed Home Health agencies with sister. Agreed on [facility].  Will update Flo Shanks, RN representative for [employer]. Ms. Alford Highland RN will check to see if [facility] goes to zip code [phone]. [facility] doesn't go to this zip code, will contact [place] representative. [facility] doesn't go to this zip code. [facility] is an agency that goes to this zip code. Will discuss this agency before contacting [facility] representative.  Sister indicated that if they transition to Hospice. They would like [facility].  Shelbie Ammons RN MSN CCM Care Management [phone]

## 2016-10-24 ENCOUNTER — Ambulatory Visit
Admission: RE | Admit: 2016-10-24 | Discharge: 2016-10-24 | Disposition: A | Discharge status: Home / Self Care | Payer: BLUE CROSS/BLUE SHIELD | Source: Ambulatory Visit | Attending: Radiation Oncology | Admitting: Radiation Oncology

## 2016-10-24 ENCOUNTER — Ambulatory Visit: Payer: BLUE CROSS/BLUE SHIELD

## 2016-10-24 DIAGNOSIS — C50812 Malignant neoplasm of overlapping sites of left female breast: Secondary | ICD-10-CM | POA: Diagnosis not present

## 2016-10-24 DIAGNOSIS — R531 Weakness: Secondary | ICD-10-CM

## 2016-10-24 MED ORDER — CHLORHEXIDINE GLUCONATE CLOTH 2 % EX PADS
6.0000 | MEDICATED_PAD | Freq: Once | CUTANEOUS | Status: AC
  Administered 2016-10-24: 22:00:00 6 via TOPICAL

## 2016-10-24 MED ORDER — CHLORHEXIDINE GLUCONATE CLOTH 2 % EX PADS: 6.0000 | MEDICATED_PAD | Freq: Every day | CUTANEOUS | Status: DC

## 2016-10-24 MED ORDER — POLYETHYLENE GLYCOL 3350 17 G PO PACK
17.0000 g | PACK | Freq: Every day | ORAL | Status: DC
  Filled 2016-10-24: qty 1

## 2016-10-24 MED ORDER — DEXTROSE 5 % IV SOLN
1.5000 g | INTRAVENOUS | Status: DC
  Filled 2016-10-24: qty 1.5

## 2016-10-24 NOTE — Consult Note (Signed)
Referring Physician:  No referring provider defined for this encounter.  Primary Physician:  Gerilyn Pilgrim, FNP  Chief Complaint:  Increased lethargy, left arm weakness and right leg weakness  History of Present Illness: Elizabeth Mccoy is a 60 y.o. female who presents with the chief complaint of increased lethargy, left arm weakness and right leg weakness since last week.  She is an unfortunate 60 y.o. Female with PMHx significant for breast cancer-ER/PR positive HER-2/neu negative- with multiple metastases to the liver and bone despite being on Taxol chemotherapy.  Since Sunday, she has been noted to be more lethargic and have weakness of her left arm and right leg.  She has been getting whole brain radiation and steroids and her conditions is slightly improving.    Neurosurgery is consulted for possible placement of Ommaya reservoir for intrathecal chemotherapy.  The symptoms are causing a significant impact on the patient's life.   Review of Systems:  A 10 point review of systems is negative, except for the pertinent positives and negatives detailed in the HPI.  Past Medical History: Past Medical History:  Diagnosis Date  . Breast cancer, right ([place]) 2015   rad tx's on right hip.  . Menopause 12/10/2006   2008    Past Surgical History: Past Surgical History:  Procedure Laterality Date  . CESAREAN SECTION      Problem List: Patient Active Problem List   Diagnosis Date Noted  . Protein-calorie malnutrition, severe 10/23/2016  . Intractable nausea and vomiting 10/21/2016  . Secondary malignant neoplasm of brain and spinal cord ([place]) 10/12/2016  . Carcinoma of overlapping sites of right breast in female, estrogen receptor positive ([place]) 10/10/2016  . Pancytopenia, acquired ([place]) 09/05/2016  . Chest wall discomfort 09/05/2016  . Metastases to the liver ([place]) 09/05/2016  . Metastatic cancer ([place]) 06/27/2016  . Malignant neoplasm of overlapping sites of right breast  ([place]) 06/15/2016  . Menopause     Allergies: Allergies as of 10/21/2016 - Review Complete 10/21/2016  Allergen Reaction Noted  . Beef extract Anaphylaxis 05/04/2015  . Pork (porcine) protein Anaphylaxis 05/04/2015  . Beef-derived products Hives 05/04/2015    Medications: @ENCMED @  Social History: Social History  Substance Use Topics  . Smoking status: Never Smoker  . Smokeless tobacco: Never Used  . Alcohol use No    Family Medical History: Family History  Problem Relation Age of Onset  . Cancer Father     prostate ca  . Cancer Sister     cancer unknown primary    Physical Examination: @VITALWITHPAIN @  General: Patient is well developed, well nourished, calm, collected, and in no apparent distress.  Psychiatric: Patient is non-anxious.  Head:  Pupils equal, round, and reactive to light.  ENT:  Oral mucosa appears well hydrated.  Neck:   Supple.  Full range of motion.  Respiratory: Patient is breathing without any difficulty.  Extremities: No edema.  Vascular: Palpable pulses.  Skin:   On exposed skin, there are no abnormal skin lesions.  NEUROLOGICAL:  General: In no acute distress.   Awake but needs to be prodded to stay awake, alert, oriented to person, place, and time.  Pupils equal round and reactive to light.  Slight left facial droop.  Tongue protrusion is midline.  Left pronator drift.    Strength: Side Biceps Triceps Deltoid Interossei Grip Wrist Ext. Wrist Flex.  R 5 5 5 5 5 5 5   L 4 4 4 5 5 5 5    Side Iliopsoas Quads Hamstring  PF DF EHL  R 5 pain limited 5 5 5 5 5   L 5 5 5 5 5 5    Reflexes are 3+ and symmetric at the biceps, triceps, brachioradialis, patella and achilles.   Bilateral upper and lower extremity sensation is intact to light touch and pin prick.  Clonus is not present.  Toes are down-going.  Imaging: MRI brain reveals multiple (>10) metastasis, both supratentorial and in the cerebellum.  There is enhancement of the dura,  consistent with leptomeningeal disease.  There is some evidence of hydrocephalus, with slightly enlarged ventricles, but no obstruction.  MRI thoracic and lumbar spine reveals enhancement of the meninges, again consistent with leptomeningeal disease.  I have personally reviewed the images and agree with the above interpretation.  Assessment and Plan: Elizabeth Mccoy is a pleasant 60 y.o. female with widely metastatic breast cancer and evidence of leptomeningeal disease.   I have discussed the condition with the patient and her family and discussing treatment options in layman's terms.  I have recommended the following:   1)  Given that patient is in the middle of radiation therapy, placing an Ommaya at this point would have very high risk for infection and wound dehiscence.  I would recommend that the patient complete their course of radiation and if there is still need for intrathecal chemotherapy, doing a few doses in the lumbar cistern may be preferred.  If she has good reaction to this, then placing an Ommaya might be considered.  2)  Agree with steroid use to help with inflammation.  Would recommend keppra 500 mg bid for seizure prophylaxis.    I will see the patient back in a few weeks to gauge progress.  Thank you for involving me in the care of this patient. I will keep you apprised of the patient's progress.   This note was partially dictated using voice recognition software, so please excuse any errors that were not corrected.   Redge Gainer M.D., Ph.D. Neurosurgery

## 2016-10-24 NOTE — Progress Notes (Signed)
[place] at [place] NAME: Elizabeth Mccoy    MR#:  [id number]  DATE OF BIRTH:  Apr 29, 1956  SUBJECTIVE: seen at bedside no nausea.no abdominal pain.still weak on right  Side.   CHIEF COMPLAINT:   Chief Complaint  Patient presents with  . Nausea  . Dizziness   Still has nausea.Marland Kitchen  REVIEW OF SYSTEMS:    Review of Systems  Constitutional: Positive for malaise/fatigue. Negative for chills and fever.  HENT: Negative for hearing loss and sore throat.   Eyes: Negative for blurred vision, double vision, photophobia and pain.  Respiratory: Negative for cough, hemoptysis, shortness of breath and wheezing.   Cardiovascular: Negative for chest pain, palpitations, orthopnea and leg swelling.  Gastrointestinal: Positive for nausea. Negative for abdominal pain, constipation, diarrhea, heartburn and vomiting.  Genitourinary: Negative for dysuria, hematuria and urgency.  Musculoskeletal: Negative for back pain, joint pain, myalgias and neck pain.  Skin: Negative for rash.  Neurological: Positive for dizziness, focal weakness and weakness. Negative for sensory change, speech change, seizures and headaches.  Endo/Heme/Allergies: Does not bruise/bleed easily.  Psychiatric/Behavioral: Negative for depression and memory loss. The patient is not nervous/anxious and does not have insomnia.     DRUG ALLERGIES:   Allergies  Allergen Reactions  . Beef Extract Anaphylaxis  . Pork (Porcine) Protein Anaphylaxis  . Beef-Derived Products Hives    VITALS:  Blood pressure (!) 158/68, pulse 63, temperature 97.8 F (36.6 C), temperature source Oral, resp. rate 18, height 5\' 1"  (1.549 m), weight 52.9 kg (116 lb 11.2 oz), SpO2 97 %.  PHYSICAL EXAMINATION:   Physical Exam  GENERAL:  60 y.o.-year-old patient lying in the bed with no acute distress. Appears cachectic. EYES: Pupils equal, round, reactive to light and accommodation. No scleral icterus. Extraocular  muscles intact.  HEENT: Head atraumatic, normocephalic. Oropharynx and nasopharynx clear.  NECK:  Supple, no jugular venous distention. No thyroid enlargement, no tenderness.  LUNGS: Normal breath sounds bilaterally, no wheezing, rales, rhonchi. No use of accessory muscles of respiration.  CARDIOVASCULAR: S1, S2 normal. No murmurs, rubs, or gallops.  ABDOMEN: Soft, nontender, nondistended. Bowel sounds present. No organomegaly or mass.  EXTREMITIES: No cyanosis, clubbing or edema b/l.    NEUROLOGIC: Patient is weak on right upper and lower extremities. PSYCHIATRIC: The patient is alert and oriented x 3.  SKIN: No obvious rash, lesion, or ulcer.   LABORATORY PANEL:   CBC  Recent Labs Lab 10/22/16 1936  WBC 19.2*  HGB 12.7  HCT 37.5  PLT 143*   ------------------------------------------------------------------------------------------------------------------ Chemistries   Recent Labs Lab 10/21/16 1458  10/23/16 0457  NA 127*  < > 133*  K 4.4  < > 4.4  CL 97*  < > 104  CO2 19*  < > 21*  GLUCOSE 117*  < > 135*  BUN 17  < > 12  CREATININE 0.48  < > 0.40*  CALCIUM 7.8*  < > 7.4*  MG 2.4  --   --   AST 54*  --  55*  ALT 64*  --  81*  ALKPHOS 229*  --  164*  BILITOT 1.0  --  0.8  < > = values in this interval not displayed. ------------------------------------------------------------------------------------------------------------------  Cardiac Enzymes No results for input(s): TROPONINI in the last 168 hours. ------------------------------------------------------------------------------------------------------------------  RADIOLOGY:  Mr Thoracic Spine W Wo Contrast  Result Date: 10/23/2016 CLINICAL DATA:  Metastatic breast cancer. EXAM: MRI THORACIC AND LUMBAR SPINE WITHOUT AND WITH CONTRAST TECHNIQUE: Multiplanar and  multiecho pulse sequences of the thoracic and lumbar spine were obtained without and with intravenous contrast. CONTRAST:  68mL MULTIHANCE GADOBENATE  DIMEGLUMINE 529 MG/ML IV SOLN COMPARISON:  Bone scan 08/29/2016 FINDINGS: MRI THORACIC SPINE FINDINGS Alignment:  Physiologic. Vertebrae: Heterogeneous marrow diffusely from extensive metastatic disease. Every thoracic vertebra is involved. No extraosseous tumor growth is noted in the epidural or foraminal spaces. No pathologic fracture. Cord: The cord has normal signal, but there is diffuse surface enhancement of the cord and intrathecal roots. Paraspinal and other soft tissues: Known hepatic metastatic disease. Trace right pleural effusion. Disc levels: No degenerative impingement MRI LUMBAR SPINE FINDINGS Segmentation:  Standard. Alignment:  Physiologic. Vertebrae: Metastases throughout all lumbar levels. No pathologic fracture or epidural/ foraminal infiltration. Conus medullaris: Extends to the T12 level and appears normal. There is diffuse thickening enhancement of the cauda equina Paraspinal and other soft tissues: The bladder is over distended and there is bilateral moderate hydroureteronephrosis. Disc levels: Lower lumbar facet arthropathy.  No degenerative impingement. These results will be called to the ordering clinician or representative by the Radiologist Assistant, and communication documented in the PACS or zVision Dashboard. IMPRESSION: 1. 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 2. Widespread osseous metastatic disease without pathologic fracture or detected epidural infiltration. 3. Over distended bladder with bilateral hydronephrosis, correlate for retention related to #1. 4. Known hepatic metastatic disease. Electronically Signed   By: Monte Fantasia M.D.   On: 10/23/2016 13:37   Mr Lumbar Spine W Wo Contrast  Result Date: 10/23/2016 CLINICAL DATA:  Metastatic breast cancer. EXAM: MRI THORACIC AND LUMBAR SPINE WITHOUT AND WITH CONTRAST TECHNIQUE: Multiplanar and multiecho pulse sequences of the thoracic and lumbar  spine were obtained without and with intravenous contrast. CONTRAST:  68mL MULTIHANCE GADOBENATE DIMEGLUMINE 529 MG/ML IV SOLN COMPARISON:  Bone scan 08/29/2016 FINDINGS: MRI THORACIC SPINE FINDINGS Alignment:  Physiologic. Vertebrae: Heterogeneous marrow diffusely from extensive metastatic disease. Every thoracic vertebra is involved. No extraosseous tumor growth is noted in the epidural or foraminal spaces. No pathologic fracture. Cord: The cord has normal signal, but there is diffuse surface enhancement of the cord and intrathecal roots. Paraspinal and other soft tissues: Known hepatic metastatic disease. Trace right pleural effusion. Disc levels: No degenerative impingement MRI LUMBAR SPINE FINDINGS Segmentation:  Standard. Alignment:  Physiologic. Vertebrae: Metastases throughout all lumbar levels. No pathologic fracture or epidural/ foraminal infiltration. Conus medullaris: Extends to the T12 level and appears normal. There is diffuse thickening enhancement of the cauda equina Paraspinal and other soft tissues: The bladder is over distended and there is bilateral moderate hydroureteronephrosis. Disc levels: Lower lumbar facet arthropathy.  No degenerative impingement. These results will be called to the ordering clinician or representative by the Radiologist Assistant, and communication documented in the PACS or zVision Dashboard. IMPRESSION: 1. 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 2. Widespread osseous metastatic disease without pathologic fracture or detected epidural infiltration. 3. Over distended bladder with bilateral hydronephrosis, correlate for retention related to #1. 4. Known hepatic metastatic disease. Electronically Signed   By: Monte Fantasia M.D.   On: 10/23/2016 13:37     ASSESSMENT AND PLAN:   * Intractable nausea and vomiting Likely from liver metastases. Patient also undergoing chemotherapy. Improving.  Continue Zofran. Advanced diet to soft diet. requesting IV medicine for heartburn. Started on IV Pepcid.   * Elevated lipase No abdominal tenderness. Lipase has returned to normal. Possible pancreatitis  but CT scan normal. Monitor.  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 Continue Decadron, radiation therapy, patient is a new port for chemotherapy, Dr. Genevive Bi will do the port placement tomorrow Because of the metastatic breast cancer with metastases to bone, liver, brain prognosis is very poor. Family is aware.  She does have new onset right lower extremity weakness likely from brain metastases.; [place] has leptomeningeal metastasis to brain and spine, spoke with oncology, patient will need to get a port placement tomorrow by Dr. Junius Finner needs [facility] port for intrathecal chemo,,Dr.Brahmanday spoke to neurosurgery at [place] will see  The patient but port needs to be placed at [facility] . * Hypovolemic hyponatremia; improved, decreased IV fluids.   All the records are reviewed and case discussed with Care Management/Social Workerr. Management plans discussed with the patient, family and they are in agreement.  CODE STATUS: FULL CODE  DVT Prophylaxis: SCDs  TOTAL TIME TAKING CARE OF THIS PATIENT: 35 minutes.   POSSIBLE D/C IN 1-2 DAYS, DEPENDING ON CLINICAL CONDITION.  Epifanio Lesches M.D on 10/24/2016 at 11:38 AM  Between 7am to 6pm - Pager - [phone]  After 6pm go to [email or web address] - password [email or web address] [place] [facility]  Office  [phone]  CC: Primary care physician; Gerilyn Pilgrim, FNP  Note: This dictation was prepared with Dragon dictation along with smaller phrase technology. Any transcriptional errors that result from this process are unintentional.

## 2016-10-24 NOTE — Progress Notes (Signed)
[facility] NOTE  Patient Care Team: Gerilyn Pilgrim, FNP as PCP - General (Nurse Practitioner)  CHIEF COMPLAINTS/PURPOSE OF CONSULTATION:  Metastatic breast cancer  CC: Improvement noted of the left upper extremity strength. Continues to have poor strength of the right lower extremity. Continues to have numbness on the face. Denies any headaches. Numbness in the spinal area. Appetite improving. She continues to be getting radiation.  ROS: A complete 10 point review of system is done which is negative except mentioned above in history of present illness  MEDICAL HISTORY:  Past Medical History:  Diagnosis Date  . Breast cancer, right ([place]) 2015   rad tx's on right hip.  . Menopause 12/10/2006   2008    SURGICAL HISTORY: Past Surgical History:  Procedure Laterality Date  . CESAREAN SECTION      SOCIAL HISTORY: Social History   Social History  . Marital status: Married    Spouse name: N/A  . Number of children: 2  . Years of education: N/A   Occupational History  . Not on file.   Social History Main Topics  . Smoking status: Never Smoker  . Smokeless tobacco: Never Used  . Alcohol use No  . Drug use: No  . Sexual activity: Not on file   Other Topics Concern  . Not on file   Social History Narrative  . No narrative on file    FAMILY HISTORY: Family History  Problem Relation Age of Onset  . Cancer Father     prostate ca  . Cancer Sister     cancer unknown primary    ALLERGIES:  is allergic to beef extract; pork (porcine) protein; and beef-derived products.  MEDICATIONS:  Current Facility-Administered Medications  Medication Dose Route Frequency Provider Last Rate Last Dose  . 0.9 %  sodium chloride infusion   Intravenous Continuous Epifanio Lesches, MD 50 mL/hr at 10/23/16 1852    . acetaminophen (TYLENOL) tablet 650 mg  650 mg Oral Q6H PRN Hillary Bow, MD   650 mg at 10/23/16 2138  . [START ON 10/25/2016] ceFAZolin (ANCEF) IVPB  2g/100 mL premix  2 g Intravenous On Call to [place], MD      . dexamethasone (DECADRON) injection 4 mg  4 mg Intravenous Q6H Baxter Hire, MD   4 mg at 10/24/16 0547  . dronabinol (MARINOL) capsule 5 mg  5 mg Oral BID AC Baxter Hire, MD   5 mg at 10/23/16 1718  . famotidine (PEPCID) IVPB 20 mg premix  20 mg Intravenous Q12H Epifanio Lesches, MD   20 mg at 10/23/16 2138  . feeding supplement (ENSURE ENLIVE) (ENSURE ENLIVE) liquid 237 mL  237 mL Oral BID BM Srikar Sudini, MD      . morphine 4 MG/ML injection 2 mg  2 mg Intravenous Q2H PRN Hillary Bow, MD   2 mg at 10/24/16 0546  . ondansetron (ZOFRAN) 8 mg in sodium chloride 0.9 % 50 mL IVPB  8 mg Intravenous Q6H Baxter Hire, MD   8 mg at 10/24/16 0543  . ondansetron (ZOFRAN) injection 4 mg  4 mg Intravenous Q6H PRN Srikar Sudini, MD      . oxyCODONE (Oxy IR/ROXICODONE) immediate release tablet 5 mg  5 mg Oral Q4H PRN Hillary Bow, MD   5 mg at 10/22/16 2117  . senna-docusate (Senokot-S) tablet 1 tablet  1 tablet Oral BID Epifanio Lesches, MD   1 tablet at 10/23/16 2138      .  PHYSICAL EXAMINATION:  Vitals:   10/23/16 2041 10/24/16 0458  BP: (!) 148/70 (!) 158/68  Pulse: 63 63  Resp: 18 18  Temp: 98.4 F (36.9 C) 97.8 F (36.6 C)   Filed Weights   10/21/16 1455 10/21/16 1930  Weight: 123 lb (55.8 kg) 116 lb 11.2 oz (52.9 kg)    GENERAL: Well-nourished well-developed; Alert, no distress and comfortable.   Accompanied by her sister.  EYES: no pallor or icterus OROPHARYNX: no thrush or ulceration. NECK: supple, no masses felt LYMPH:  no palpable lymphadenopathy in the cervical, axillary or inguinal regions LUNGS: decreased breath sounds to auscultation at bases and  No wheeze or crackles HEART/CVS: regular rate & rhythm and no murmurs; No lower extremity edema ABDOMEN: abdomen soft, non-tender and normal bowel sounds Musculoskeletal:no cyanosis of digits and no clubbing  PSYCH: alert & oriented x 3  with fluent speech NEURO: 3-5 weakness of the right lower extremity ; 4 out of 5 weakness of the left upper extremity.  SKIN:  no rashes or significant lesions  LABORATORY DATA:  I have reviewed the data as listed Lab Results  Component Value Date   WBC 19.2 (H) 10/22/2016   HGB 12.7 10/22/2016   HCT 37.5 10/22/2016   MCV 94.7 10/22/2016   PLT 143 (L) 10/22/2016    Recent Labs  10/10/16 1315 10/21/16 1458 10/22/16 0335 10/23/16 0457  NA 132* 127* 131* 133*  K 3.7 4.4 4.6 4.4  CL 100* 97* 104 104  CO2 24 19* 20* 21*  GLUCOSE 111* 117* 128* 135*  BUN 13 17 13 12   CREATININE 0.56 0.48 0.40* 0.40*  CALCIUM 8.8* 7.8* 7.5* 7.4*  GFRNONAA >60 >60 >60 >60  GFRAA >60 >60 >60 >60  PROT 7.0 7.1  --  6.3*  ALBUMIN 4.4 4.2  --  3.7  AST 38 54*  --  55*  ALT 29 64*  --  81*  ALKPHOS 262* 229*  --  164*  BILITOT 0.6 1.0  --  0.8    RADIOGRAPHIC STUDIES: I have personally reviewed the radiological images as listed and agreed with the findings in the report. Ct Head Wo Contrast  Result Date: 10/21/2016 CLINICAL DATA:  Metastatic breast cancer. EXAM: CT HEAD WITHOUT CONTRAST TECHNIQUE: Contiguous axial images were obtained from the base of the skull through the vertex without intravenous contrast. COMPARISON:  MRI head 10/12/2016 FINDINGS: Brain: Progressive ventricular dilatation since the prior MRI. This is most consistent with hydrocephalus in this patient with metastatic disease and may be due to leptomeningeal tumor. No obstructing mass lesion. Multiple small metastatic deposits are seen throughout the brain on the MRI. These are not well seen on unenhanced CT however there is mild edema in the right occipital lobe and in the right frontal lobe over the convexity consistent with metastatic disease. Negative for hemorrhage. Vascular: Negative for dense MCA Skull: Patchy sclerotic disease in the clivus compatible with metastatic disease. No destructive skull lesion. Sinuses/Orbits:  Infiltration of the orbit with enlargement of the extra-ocular muscles most consistent with metastatic disease to the orbit as identified on MRI. Other: None IMPRESSION: Progressive ventricular dilatation since the recent MRI compatible with hydrocephalus. 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 Multiple brain metastasis better seen on MRI. No large obstructing mass lesion is identified. Negative for hemorrhage Metastatic disease to the clivus. Metastatic disease to the orbit bilaterally. Electronically Signed   By: Franchot Gallo M.D.   On: 10/21/2016 17:22  Ct Chest W Contrast  Result Date: 10/11/2016 CLINICAL DATA:  Metastatic breast cancer. EXAM: CT CHEST, ABDOMEN, AND PELVIS WITH CONTRAST TECHNIQUE: Multidetector CT imaging of the chest, abdomen and pelvis was performed following the standard protocol during bolus administration of intravenous contrast. CONTRAST:  124mL ISOVUE-300 IOPAMIDOL (ISOVUE-300) INJECTION 61% COMPARISON:  08/29/2016 FINDINGS: CT CHEST FINDINGS Cardiovascular: The heart size appears normal. Aortic atherosclerosis noted. Mediastinum/Nodes: The trachea appears patent and is midline. Normal appearance of the esophagus. Right axillary node measures 9 mm, image number 18 of series 2. Previously this measured the same. Interval decrease in soft tissue stranding within the right axilla which may be related to prior instrumentation. No enlarged mediastinal or hilar nodes. Lungs/Pleura: Small right pleural effusion. Mild diffuse bronchial wall thickening noted. Left upper lobe pulmonary nodule is unchanged measuring 4 mm, image 56 of series 4. Perifissural nodule in the left lower lobe is stable measuring 4 mm, image number 80 of series 4. No new or enlarging pulmonary nodules identified Musculoskeletal: Extensive, widespread sclerotic bone metastases again noted. Subjectively increased to previous exam. CT ABDOMEN PELVIS FINDINGS Hepatobiliary:  Multifocal liver metastases again identified. Index lesion within the right lobe measures 2.6 cm, image 41 of series 2. Previously this measured the same. Lateral segment of left lobe of liver lesion measures 1.5 cm, image 49 of series 2. Previously 2.1 cm. The index lesion within the lateral right lobe of liver measures 2 cm, image 51 of series 2. Previously 2.3 cm. Index lesion within the inferior right lobe measures 2.4 cm, image 59 of series 2. Previously 2.6 cm. Gallbladder appears normal. No biliary dilatation. Pancreas: Normal appearance of the pancreas. Spleen: No change in small low density structure within the spleen measuring 6 mm, image 51 of series 2. Adrenals/Urinary Tract: The adrenal glands are normal. There is persistent bilateral pelvocaliectasis. No hydroureter. Enhancing nodule along the anterior wall of the bladder is again noted, image 81 89 of series 6. Stomach/Bowel: The stomach is normal. The small bowel loops have a normal caliber. No bowel obstruction. No pathologic dilatation of the colon. Increase scratch set there is mild thickening of the appendix which measures 8 mm in diameter, image 77 of series 2. No free fluid or fluid collections noted within in the right lower quadrant. Vascular/Lymphatic: Normal appearance of the abdominal aorta. No enlarged upper abdominal lymph nodes. There is no pelvic or inguinal adenopathy. Reproductive: The uterus appears unremarkable. The right ovary measures 2.8 x 2.0 cm, image 89 of series 2. Previously 2.8 x 1.5 cm. Other: There is a small amount of free fluid identified within the pelvis. This is new from previous exam. Additionally, there is a suggestion subtle soft tissue nodularity within the peritoneal fat. For example, image number 64 of series 2 Musculoskeletal: Diffuse all osseous sclerosis compatible secondary to metastatic disease is again identified. IMPRESSION: 1. Index liver lesions are slightly decreased in size from previous exam. 2.  Widespread sclerotic bone metastasis as noted previously. Subjectively there may be an increase in the sclerosis which could reflect healing of bone metastases or progression of sclerotic bone metastases. 3. New ascites identified within the pelvis. Subtle areas of soft tissue nodularity within the peritoneum is concerning for development of peritoneal metastases. 4. Slight enlargement of the right ovary. Attention to the right ovary on follow-up imaging to monitor for potential ovarian metastases. Electronically Signed   By: Kerby Moors M.D.   On: 10/11/2016 11:59   Mr Jeri Cos [id number] Contrast  Result Date:  10/12/2016 CLINICAL DATA:  60 year old female with metastatic breast cancer. Staging. Subsequent encounter. EXAM: MRI HEAD WITHOUT AND WITH CONTRAST TECHNIQUE: Multiplanar, multiecho pulse sequences of the brain and surrounding structures were obtained without and with intravenous contrast. CONTRAST:  67mL MULTIHANCE GADOBENATE DIMEGLUMINE 529 MG/ML IV SOLN COMPARISON:  PET-CT 11/14/2015. FINDINGS: Brain: Numerous small brain metastases. There are at least 15 small supratentorial metastases ranging from punctate to 10 mm diameter. There is also extensive metastatic disease affecting the cerebellum with widespread small areas of abnormal enhancement, many of which are suspicious for leptomeningeal disease (series 16 images 14 and 15). The largest individual cerebellar lesions are 8-9 mm. Despite the extensive disease there is only occasional mild cerebral and cerebellar edema, and no significant intracranial mass effect. No hemorrhagic metastases. No abnormal ependymoma 1 enhancement. No pachymeningeal thickening or enhancement. No restricted diffusion or evidence of acute infarction. No ventriculomegaly. No acute intracranial hemorrhage identified. Negative pituitary. Negative cervicomedullary junction. No chronic cerebral blood products identified. There are small chronic lacunar infarcts in the left  cerebellum. Subtle chronic cortical encephalomalacia in the right parietal lobe (series 9, image 21) compatible with previous small posterior right MCA infarct. Vascular: Major intracranial vascular flow voids are preserved, the distal left vertebral artery appears dominant. Skull and upper cervical spine: Diffuse abnormal bone marrow signal throughout the cervical spine and at the skullbase in keeping with widespread osseous metastatic disease. No destructive calvarial lesion identified. Negative visualized cervical spinal cord. Sinuses/Orbits: Abnormal heterogeneous appearance of the bilateral intraorbital soft tissues including the right lateral rectus muscle which is expanded in T2 hyperintense (series 14, image 20 and series 9, image 9). Similar heterogeneity throughout the left orbital soft tissues. There seems to be a mild degree of enophthalmos. The globes appear remain intact. Trace paranasal sinus mucosal thickening. Other: Mastoids are clear. Visible internal auditory structures appear normal. Negative scalp soft tissues. IMPRESSION: 1. Widespread metastatic disease to the brain. At least 15 small supratentorial metastases ranging from punctate to 10 mm diameter, and extensive cerebellar metastatic disease which appears highly suspicious for leptomeningeal metastases. 2. Only mild scattered brain edema and no significant intracranial mass effect. 3. Bilateral orbit soft tissue metastases (scirrhous infiltration of the orbits). 4. Widespread osseous metastatic disease. Electronically Signed   By: Genevie Ann M.D.   On: 10/12/2016 13:18   Mr Thoracic Spine W Wo Contrast  Result Date: 10/23/2016 CLINICAL DATA:  Metastatic breast cancer. EXAM: MRI THORACIC AND LUMBAR SPINE WITHOUT AND WITH CONTRAST TECHNIQUE: Multiplanar and multiecho pulse sequences of the thoracic and lumbar spine were obtained without and with intravenous contrast. CONTRAST:  14mL MULTIHANCE GADOBENATE DIMEGLUMINE 529 MG/ML IV SOLN  COMPARISON:  Bone scan 08/29/2016 FINDINGS: MRI THORACIC SPINE FINDINGS Alignment:  Physiologic. Vertebrae: Heterogeneous marrow diffusely from extensive metastatic disease. Every thoracic vertebra is involved. No extraosseous tumor growth is noted in the epidural or foraminal spaces. No pathologic fracture. Cord: The cord has normal signal, but there is diffuse surface enhancement of the cord and intrathecal roots. Paraspinal and other soft tissues: Known hepatic metastatic disease. Trace right pleural effusion. Disc levels: No degenerative impingement MRI LUMBAR SPINE FINDINGS Segmentation:  Standard. Alignment:  Physiologic. Vertebrae: Metastases throughout all lumbar levels. No pathologic fracture or epidural/ foraminal infiltration. Conus medullaris: Extends to the T12 level and appears normal. There is diffuse thickening enhancement of the cauda equina Paraspinal and other soft tissues: The bladder is over distended and there is bilateral moderate hydroureteronephrosis. Disc levels: Lower lumbar facet arthropathy.  No degenerative  impingement. These results will be called to the ordering clinician or representative by the Radiologist Assistant, and communication documented in the PACS or zVision Dashboard. IMPRESSION: 1. 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 2. Widespread osseous metastatic disease without pathologic fracture or detected epidural infiltration. 3. Over distended bladder with bilateral hydronephrosis, correlate for retention related to #1. 4. Known hepatic metastatic disease. Electronically Signed   By: Monte Fantasia M.D.   On: 10/23/2016 13:37   Mr Lumbar Spine W Wo Contrast  Result Date: 10/23/2016 CLINICAL DATA:  Metastatic breast cancer. EXAM: MRI THORACIC AND LUMBAR SPINE WITHOUT AND WITH CONTRAST TECHNIQUE: Multiplanar and multiecho pulse sequences of the thoracic and lumbar spine were obtained without and  with intravenous contrast. CONTRAST:  40mL MULTIHANCE GADOBENATE DIMEGLUMINE 529 MG/ML IV SOLN COMPARISON:  Bone scan 08/29/2016 FINDINGS: MRI THORACIC SPINE FINDINGS Alignment:  Physiologic. Vertebrae: Heterogeneous marrow diffusely from extensive metastatic disease. Every thoracic vertebra is involved. No extraosseous tumor growth is noted in the epidural or foraminal spaces. No pathologic fracture. Cord: The cord has normal signal, but there is diffuse surface enhancement of the cord and intrathecal roots. Paraspinal and other soft tissues: Known hepatic metastatic disease. Trace right pleural effusion. Disc levels: No degenerative impingement MRI LUMBAR SPINE FINDINGS Segmentation:  Standard. Alignment:  Physiologic. Vertebrae: Metastases throughout all lumbar levels. No pathologic fracture or epidural/ foraminal infiltration. Conus medullaris: Extends to the T12 level and appears normal. There is diffuse thickening enhancement of the cauda equina Paraspinal and other soft tissues: The bladder is over distended and there is bilateral moderate hydroureteronephrosis. Disc levels: Lower lumbar facet arthropathy.  No degenerative impingement. These results will be called to the ordering clinician or representative by the Radiologist Assistant, and communication documented in the PACS or zVision Dashboard. IMPRESSION: 1. 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 2. Widespread osseous metastatic disease without pathologic fracture or detected epidural infiltration. 3. Over distended bladder with bilateral hydronephrosis, correlate for retention related to #1. 4. Known hepatic metastatic disease. Electronically Signed   By: Monte Fantasia M.D.   On: 10/23/2016 13:37   Ct Abdomen Pelvis W Contrast  Result Date: 10/11/2016 CLINICAL DATA:  Metastatic breast cancer. EXAM: CT CHEST, ABDOMEN, AND PELVIS WITH CONTRAST TECHNIQUE: Multidetector CT  imaging of the chest, abdomen and pelvis was performed following the standard protocol during bolus administration of intravenous contrast. CONTRAST:  110mL ISOVUE-300 IOPAMIDOL (ISOVUE-300) INJECTION 61% COMPARISON:  08/29/2016 FINDINGS: CT CHEST FINDINGS Cardiovascular: The heart size appears normal. Aortic atherosclerosis noted. Mediastinum/Nodes: The trachea appears patent and is midline. Normal appearance of the esophagus. Right axillary node measures 9 mm, image number 18 of series 2. Previously this measured the same. Interval decrease in soft tissue stranding within the right axilla which may be related to prior instrumentation. No enlarged mediastinal or hilar nodes. Lungs/Pleura: Small right pleural effusion. Mild diffuse bronchial wall thickening noted. Left upper lobe pulmonary nodule is unchanged measuring 4 mm, image 56 of series 4. Perifissural nodule in the left lower lobe is stable measuring 4 mm, image number 80 of series 4. No new or enlarging pulmonary nodules identified Musculoskeletal: Extensive, widespread sclerotic bone metastases again noted. Subjectively increased to previous exam. CT ABDOMEN PELVIS FINDINGS Hepatobiliary: Multifocal liver metastases again identified. Index lesion within the right lobe measures 2.6 cm, image 41 of series 2. Previously this measured the same. Lateral segment of left lobe of liver lesion  measures 1.5 cm, image 49 of series 2. Previously 2.1 cm. The index lesion within the lateral right lobe of liver measures 2 cm, image 51 of series 2. Previously 2.3 cm. Index lesion within the inferior right lobe measures 2.4 cm, image 59 of series 2. Previously 2.6 cm. Gallbladder appears normal. No biliary dilatation. Pancreas: Normal appearance of the pancreas. Spleen: No change in small low density structure within the spleen measuring 6 mm, image 51 of series 2. Adrenals/Urinary Tract: The adrenal glands are normal. There is persistent bilateral pelvocaliectasis. No  hydroureter. Enhancing nodule along the anterior wall of the bladder is again noted, image 81 89 of series 6. Stomach/Bowel: The stomach is normal. The small bowel loops have a normal caliber. No bowel obstruction. No pathologic dilatation of the colon. Increase scratch set there is mild thickening of the appendix which measures 8 mm in diameter, image 77 of series 2. No free fluid or fluid collections noted within in the right lower quadrant. Vascular/Lymphatic: Normal appearance of the abdominal aorta. No enlarged upper abdominal lymph nodes. There is no pelvic or inguinal adenopathy. Reproductive: The uterus appears unremarkable. The right ovary measures 2.8 x 2.0 cm, image 89 of series 2. Previously 2.8 x 1.5 cm. Other: There is a small amount of free fluid identified within the pelvis. This is new from previous exam. Additionally, there is a suggestion subtle soft tissue nodularity within the peritoneal fat. For example, image number 64 of series 2 Musculoskeletal: Diffuse all osseous sclerosis compatible secondary to metastatic disease is again identified. IMPRESSION: 1. Index liver lesions are slightly decreased in size from previous exam. 2. Widespread sclerotic bone metastasis as noted previously. Subjectively there may be an increase in the sclerosis which could reflect healing of bone metastases or progression of sclerotic bone metastases. 3. New ascites identified within the pelvis. Subtle areas of soft tissue nodularity within the peritoneum is concerning for development of peritoneal metastases. 4. Slight enlargement of the right ovary. Attention to the right ovary on follow-up imaging to monitor for potential ovarian metastases. Electronically Signed   By: Kerby Moors M.D.   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 Donella Stade. Continue radiation. Given the new onset of right lower extremity weakness/ left upper extremity weakness- recommend MRI with contrast- thoracic lumbar spine. Continue dexamethasone; IV PPI. Discussed regarding Ommaya port placement for intrathecal chemotherapy. Patient is interested. Will discuss with neurosurgery from [facility]. Left messages with office.  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 Awaiting mediport placement with Dr.Oaks.   # Poor by mouth intake/dehydration- improved with IV fluids.  # Overall poor prognosis. Discussed that without treatment the life expectancy/few months; however this treatment if response- year or so. Discussed with Dr.Konidena. Also discussed with Dr.Crystal.     Cammie Sickle, MD 10/24/2016 8:53 AM

## 2016-10-24 NOTE — Progress Notes (Signed)
PT Cancellation Note  Patient Details Name: Elizabeth Mccoy MRN: [id number] DOB: 1956/10/06   Cancelled Treatment:    Reason Eval/Treat Not Completed: Fatigue/lethargy limiting ability to participate. Spoke with nursing before attempting treatment due to sign on door. Nursing notes pt was finally resting well after returning from radiation. Nursing checks on pt to see if she is still sleeping; pt is sleeping well. Nursing asks to hold PT at this time. Nursing also notes surgeon is to stop by to discuss probable upcoming surgery. Discussed Right lower extremity buckling when pt stands/transfers to bathroom; suggested possible knee brace to improve support to Right lower extremity and safety of transfers.    Larae Grooms, PTA 10/24/2016, 1:43 PM

## 2016-10-24 NOTE — Plan of Care (Signed)
Problem: Safety: Goal: Ability to remain free from injury will improve Outcome: Progressing bsc now. Too unsteady to  amb to br with assist  Problem: Pain Managment: Goal: General experience of comfort will improve Outcome: Not Progressing Increase in pain med  Needed today  Problem: Activity: Goal: Risk for activity intolerance will decrease Outcome: Not Progressing See above note

## 2016-10-25 ENCOUNTER — Ambulatory Visit
Admission: RE | Admit: 2016-10-25 | Discharge: 2016-10-25 | Disposition: A | Discharge status: Home / Self Care | Payer: BLUE CROSS/BLUE SHIELD | Source: Ambulatory Visit | Attending: Radiation Oncology | Admitting: Radiation Oncology

## 2016-10-25 ENCOUNTER — Inpatient Hospital Stay: Payer: BLUE CROSS/BLUE SHIELD

## 2016-10-25 ENCOUNTER — Ambulatory Visit: Payer: BLUE CROSS/BLUE SHIELD

## 2016-10-25 ENCOUNTER — Encounter
Admission: EM | Disposition: A | Discharge status: Hospice | Payer: Self-pay | Source: Home / Self Care | Attending: Internal Medicine

## 2016-10-25 DIAGNOSIS — M542 Cervicalgia: Secondary | ICD-10-CM

## 2016-10-25 DIAGNOSIS — R51 Headache: Secondary | ICD-10-CM

## 2016-10-25 DIAGNOSIS — R110 Nausea: Secondary | ICD-10-CM

## 2016-10-25 DIAGNOSIS — B370 Candidal stomatitis: Secondary | ICD-10-CM

## 2016-10-25 DIAGNOSIS — C50812 Malignant neoplasm of overlapping sites of left female breast: Secondary | ICD-10-CM | POA: Diagnosis not present

## 2016-10-25 SURGERY — INSERTION, TUNNELED CENTRAL VENOUS DEVICE, WITH PORT
Anesthesia: Choice

## 2016-10-25 MED ORDER — PROMETHAZINE HCL 25 MG/ML IJ SOLN
25.0000 mg | Freq: Four times a day (QID) | INTRAMUSCULAR | Status: DC | PRN
  Administered 2016-10-25: 13:00:00 25 mg via INTRAVENOUS
  Filled 2016-10-25 (×2): qty 1

## 2016-10-25 MED ORDER — LORAZEPAM 2 MG/ML IJ SOLN: 0.5000 mg | Freq: Once | INTRAMUSCULAR | Status: DC

## 2016-10-25 MED ORDER — CHLORHEXIDINE GLUCONATE CLOTH 2 % EX PADS: 6.0000 | MEDICATED_PAD | Freq: Once | CUTANEOUS | Status: DC

## 2016-10-25 MED ORDER — HALOPERIDOL LACTATE 5 MG/ML IJ SOLN
2.5000 mg | Freq: Once | INTRAMUSCULAR | Status: DC
  Filled 2016-10-25: qty 1

## 2016-10-25 MED ORDER — NYSTATIN 100000 UNIT/ML MT SUSP
5.0000 mL | Freq: Four times a day (QID) | OROMUCOSAL | Status: DC
  Administered 2016-10-25 – 2016-10-30 (×8): 500000 [IU] via ORAL
  Filled 2016-10-25 (×10): qty 5

## 2016-10-25 MED ORDER — CHLORHEXIDINE GLUCONATE CLOTH 2 % EX PADS
6.0000 | MEDICATED_PAD | Freq: Once | CUTANEOUS | Status: AC
  Administered 2016-10-25: 6 via TOPICAL

## 2016-10-25 NOTE — Progress Notes (Signed)
PT Cancellation Note  Patient Details Name: Elizabeth Mccoy MRN: [id number] DOB: February 27, 1956   Cancelled Treatment:    Reason Eval/Treat Not Completed: Pain limiting ability to participate;Fatigue/lethargy limiting ability to participate;Other (comment). Treatment attempted; pt feeling sickly with pain and nausea and severe weakness. Pt feels unable to participate with PT. Re attempt at a later time/date as medical situation allows.    Larae Grooms, PTA 10/25/2016, 12:34 PM

## 2016-10-25 NOTE — Progress Notes (Signed)
Nutrition Follow-up  DOCUMENTATION CODES:   Severe malnutrition in context of chronic illness  INTERVENTION:  Continue Ensure Enlive po BID as ordered, each supplement provides 350 kcal and 20 grams of protein. RD offered to switch ONS to something less sweet, but family refusing.  Continue to encourage adequate intake of calories and protein with meals, snacks, and beverages.   Will monitor for discussions regarding goals of care. Patient currently full code.  NUTRITION DIAGNOSIS:   Inadequate oral intake related to poor appetite, nausea, vomiting as evidenced by per patient/family report, percent weight loss.  Ongoing.  GOAL:   Patient will meet greater than or equal to 90% of their needs  Not met.  MONITOR:   PO intake, Supplement acceptance, Labs, Weight trends, I & O's  REASON FOR ASSESSMENT:   Malnutrition Screening Tool    ASSESSMENT:   60 year old female who has a history of metastatic breast cancer. She has metastasis to bone and liver and now to brain. She is to undergo radiation therapy for the brain metastases 11/13. She is recently started on some Decadron. She's now developed intractable nausea has been unable to take medications consistently including pain medications. She appears weak and unable to eat. Possible pancreatitis in setting of intractable nausea and elevated lipase.  -Patient has been continuing radiation therapy. Chemotherapy is held given ongoing radiation. -Possible Ommaya port placement for intrathecal chemotherapy pending discussion with neurosurgery from [place]. Will consider if clinical status improves. -Per Oncology note, life expectancy is only a few months without treatment, but with treatment will be a year or so. -Lipase has returned to normal and CT scan normal. -Patient with new onset right lower extremity weakness - thought to be in setting of brain metastases.  RD attempted to speak with patient at bedside. However, family outside  room would not allow RD into room to speak to patient stating, "She does not want to talk about food right now." Family reports poor appetite that has worsened in the past day and ongoing nausea. Report she does not like the Ensure because it is too sweet. RD offered another supplement that would be less sweet, but family refusing at this time saying she will not want it. Family told RD they were going to ask the physicians about TF for patient.  Meal Completion: 0-25% on 11/14. Patient did order 3 meals yesterday, unsure of how much she was able to complete. She did not want breakfast this morning.  Medications reviewed and include: dexamethasone 4 mg Q6hrs, Marinol 5 mg BID before meals, famotidine, Mycostatin 5 ml QID, Zofran 8 mg Q6hrs, Miralax, senna, NS @ 50 ml/hr.  Labs reviewed: Sodium 133, CO2 21, Creatinine 0.4, Glucose 135.  Discussed with RN. Patient has had significant decline in the past 24 hours. She has had minimal PO intake and has not had any Ensure supplements.  Diet Order:  DIET SOFT Room service appropriate? Yes; Fluid consistency: Thin  Skin:  Reviewed, no issues  Last BM:  10/19/2016  Height:   Ht Readings from Last 1 Encounters:  10/21/16 5' 1"  (1.549 m)    Weight:   Wt Readings from Last 1 Encounters:  10/21/16 116 lb 11.2 oz (52.9 kg)    Ideal Body Weight:  47.72 kg  BMI:  Body mass index is 22.05 kg/m.  Estimated Nutritional Needs:   Kcal:  1585-1850 (30-35 kcal/kg)  Protein:  80-90 grams (1.5-1.7 grams/kg)  Fluid:  >/= 1.5 L/day  EDUCATION NEEDS:   Education needs addressed (  High-Calorie High-Protein MNT, Nausea/Vomiting MNT)  Willey Blade, MS, RD, LDN Pager: [phone] After Hours Pager: [phone]

## 2016-10-25 NOTE — Progress Notes (Signed)
[facility] NOTE  Patient Care Team: Gerilyn Pilgrim, FNP as PCP - General (Nurse Practitioner)  CHIEF COMPLAINTS/PURPOSE OF CONSULTATION:  Metastatic breast cancer  CC: Patient resting; complains of pain in the head and neck. Patient has been sleeping all day yesterday. Complains of nausea. Poor appetite. After the family patient has been having difficulty getting up and walk to the bathroom. She notes more weakness in the legs and arms.    MEDICAL HISTORY:  Past Medical History:  Diagnosis Date  . Breast cancer, right ([place]) 2015   rad tx's on right hip.  . Menopause 12/10/2006   2008    SURGICAL HISTORY: Past Surgical History:  Procedure Laterality Date  . CESAREAN SECTION      SOCIAL HISTORY: Social History   Social History  . Marital status: Married    Spouse name: N/A  . Number of children: 2  . Years of education: N/A   Occupational History  . Not on file.   Social History Main Topics  . Smoking status: Never Smoker  . Smokeless tobacco: Never Used  . Alcohol use No  . Drug use: No  . Sexual activity: Not on file   Other Topics Concern  . Not on file   Social History Narrative  . No narrative on file    FAMILY HISTORY: Family History  Problem Relation Age of Onset  . Cancer Father     prostate ca  . Cancer Sister     cancer unknown primary    ALLERGIES:  is allergic to beef extract; pork (porcine) protein; and beef-derived products.  MEDICATIONS:  Current Facility-Administered Medications  Medication Dose Route Frequency Provider Last Rate Last Dose  . 0.9 %  sodium chloride infusion   Intravenous Continuous Epifanio Lesches, MD 50 mL/hr at 10/24/16 1815    . acetaminophen (TYLENOL) tablet 650 mg  650 mg Oral Q6H PRN Hillary Bow, MD   650 mg at 10/23/16 2138  . ceFAZolin (ANCEF) IVPB 2g/100 mL premix  2 g Intravenous On Call to [place], MD      . cefUROXime (ZINACEF) 1.5 g in dextrose 5 % 50 mL IVPB  1.5 g  Intravenous 60 min Pre-Op Nestor Lewandowsky, MD      . dexamethasone (DECADRON) injection 4 mg  4 mg Intravenous Q6H Baxter Hire, MD   4 mg at 10/25/16 0616  . dronabinol (MARINOL) capsule 5 mg  5 mg Oral BID AC Baxter Hire, MD   5 mg at 10/24/16 1144  . famotidine (PEPCID) IVPB 20 mg premix  20 mg Intravenous Q12H Epifanio Lesches, MD   20 mg at 10/25/16 0839  . feeding supplement (ENSURE ENLIVE) (ENSURE ENLIVE) liquid 237 mL  237 mL Oral BID BM Srikar Sudini, MD      . morphine 4 MG/ML injection 2 mg  2 mg Intravenous Q2H PRN Hillary Bow, MD   2 mg at 10/25/16 0839  . ondansetron (ZOFRAN) 8 mg in sodium chloride 0.9 % 50 mL IVPB  8 mg Intravenous Q6H Baxter Hire, MD   8 mg at 10/25/16 0616  . ondansetron (ZOFRAN) injection 4 mg  4 mg Intravenous Q6H PRN Hillary Bow, MD   4 mg at 10/25/16 0839  . oxyCODONE (Oxy IR/ROXICODONE) immediate release tablet 5 mg  5 mg Oral Q4H PRN Hillary Bow, MD   5 mg at 10/22/16 2117  . polyethylene glycol (MIRALAX / GLYCOLAX) packet 17 g  17 g Oral Daily  Epifanio Lesches, MD      . senna-docusate (Senokot-S) tablet 1 tablet  1 tablet Oral BID Epifanio Lesches, MD   1 tablet at 10/24/16 1144      .  PHYSICAL EXAMINATION:  Vitals:   10/24/16 2144 10/25/16 0543  BP: (!) 174/73 (!) 161/66  Pulse: (!) 59 63  Resp: 16 16  Temp: 97.8 F (36.6 C) 97.8 F (36.6 C)   Filed Weights   10/21/16 1455 10/21/16 1930  Weight: 123 lb (55.8 kg) 116 lb 11.2 oz (52.9 kg)    GENERAL: Moderate built moderately nourished female patient resting in the bed..   Accompanied by her father/friend EYES: no pallor or icterus OROPHARYNX: no thrush or ulceration. NECK: supple, no masses felt LYMPH:  no palpable lymphadenopathy in the cervical, axillary or inguinal regions LUNGS: decreased breath sounds to auscultation at bases and  No wheeze or crackles HEART/CVS: regular rate & rhythm and no murmurs; No lower extremity edema ABDOMEN: abdomen soft,  non-tender and normal bowel sounds Musculoskeletal:no cyanosis of digits and no clubbing  PSYCH: Sleepy; arousable easily. NEURO: Complains of more weakness all over. Patient not able to cooperate with exam. SKIN:  no rashes or significant lesions  LABORATORY DATA:  I have reviewed the data as listed Lab Results  Component Value Date   WBC 19.2 (H) 10/22/2016   HGB 12.7 10/22/2016   HCT 37.5 10/22/2016   MCV 94.7 10/22/2016   PLT 143 (L) 10/22/2016    Recent Labs  10/10/16 1315 10/21/16 1458 10/22/16 0335 10/23/16 0457  NA 132* 127* 131* 133*  K 3.7 4.4 4.6 4.4  CL 100* 97* 104 104  CO2 24 19* 20* 21*  GLUCOSE 111* 117* 128* 135*  BUN 13 17 13 12   CREATININE 0.56 0.48 0.40* 0.40*  CALCIUM 8.8* 7.8* 7.5* 7.4*  GFRNONAA >60 >60 >60 >60  GFRAA >60 >60 >60 >60  PROT 7.0 7.1  --  6.3*  ALBUMIN 4.4 4.2  --  3.7  AST 38 54*  --  55*  ALT 29 64*  --  81*  ALKPHOS 262* 229*  --  164*  BILITOT 0.6 1.0  --  0.8    RADIOGRAPHIC STUDIES: I have personally reviewed the radiological images as listed and agreed with the findings in the report. Ct Head Wo Contrast  Result Date: 10/21/2016 CLINICAL DATA:  Metastatic breast cancer. EXAM: CT HEAD WITHOUT CONTRAST TECHNIQUE: Contiguous axial images were obtained from the base of the skull through the vertex without intravenous contrast. COMPARISON:  MRI head 10/12/2016 FINDINGS: Brain: Progressive ventricular dilatation since the prior MRI. This is most consistent with hydrocephalus in this patient with metastatic disease and may be due to leptomeningeal tumor. No obstructing mass lesion. Multiple small metastatic deposits are seen throughout the brain on the MRI. These are not well seen on unenhanced CT however there is mild edema in the right occipital lobe and in the right frontal lobe over the convexity consistent with metastatic disease. Negative for hemorrhage. Vascular: Negative for dense MCA Skull: Patchy sclerotic disease in the  clivus compatible with metastatic disease. No destructive skull lesion. Sinuses/Orbits: Infiltration of the orbit with enlargement of the extra-ocular muscles most consistent with metastatic disease to the orbit as identified on MRI. Other: None IMPRESSION: Progressive ventricular dilatation since the recent MRI compatible with hydrocephalus. 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 Multiple brain metastasis better seen on MRI. No large obstructing mass lesion is identified.  Negative for hemorrhage Metastatic disease to the clivus. Metastatic disease to the orbit bilaterally. Electronically Signed   By: Franchot Gallo M.D.   On: 10/21/2016 17:22   Ct Chest W Contrast  Result Date: 10/11/2016 CLINICAL DATA:  Metastatic breast cancer. EXAM: CT CHEST, ABDOMEN, AND PELVIS WITH CONTRAST TECHNIQUE: Multidetector CT imaging of the chest, abdomen and pelvis was performed following the standard protocol during bolus administration of intravenous contrast. CONTRAST:  173mL ISOVUE-300 IOPAMIDOL (ISOVUE-300) INJECTION 61% COMPARISON:  08/29/2016 FINDINGS: CT CHEST FINDINGS Cardiovascular: The heart size appears normal. Aortic atherosclerosis noted. Mediastinum/Nodes: The trachea appears patent and is midline. Normal appearance of the esophagus. Right axillary node measures 9 mm, image number 18 of series 2. Previously this measured the same. Interval decrease in soft tissue stranding within the right axilla which may be related to prior instrumentation. No enlarged mediastinal or hilar nodes. Lungs/Pleura: Small right pleural effusion. Mild diffuse bronchial wall thickening noted. Left upper lobe pulmonary nodule is unchanged measuring 4 mm, image 56 of series 4. Perifissural nodule in the left lower lobe is stable measuring 4 mm, image number 80 of series 4. No new or enlarging pulmonary nodules identified Musculoskeletal: Extensive, widespread sclerotic bone metastases again noted.  Subjectively increased to previous exam. CT ABDOMEN PELVIS FINDINGS Hepatobiliary: Multifocal liver metastases again identified. Index lesion within the right lobe measures 2.6 cm, image 41 of series 2. Previously this measured the same. Lateral segment of left lobe of liver lesion measures 1.5 cm, image 49 of series 2. Previously 2.1 cm. The index lesion within the lateral right lobe of liver measures 2 cm, image 51 of series 2. Previously 2.3 cm. Index lesion within the inferior right lobe measures 2.4 cm, image 59 of series 2. Previously 2.6 cm. Gallbladder appears normal. No biliary dilatation. Pancreas: Normal appearance of the pancreas. Spleen: No change in small low density structure within the spleen measuring 6 mm, image 51 of series 2. Adrenals/Urinary Tract: The adrenal glands are normal. There is persistent bilateral pelvocaliectasis. No hydroureter. Enhancing nodule along the anterior wall of the bladder is again noted, image 81 89 of series 6. Stomach/Bowel: The stomach is normal. The small bowel loops have a normal caliber. No bowel obstruction. No pathologic dilatation of the colon. Increase scratch set there is mild thickening of the appendix which measures 8 mm in diameter, image 77 of series 2. No free fluid or fluid collections noted within in the right lower quadrant. Vascular/Lymphatic: Normal appearance of the abdominal aorta. No enlarged upper abdominal lymph nodes. There is no pelvic or inguinal adenopathy. Reproductive: The uterus appears unremarkable. The right ovary measures 2.8 x 2.0 cm, image 89 of series 2. Previously 2.8 x 1.5 cm. Other: There is a small amount of free fluid identified within the pelvis. This is new from previous exam. Additionally, there is a suggestion subtle soft tissue nodularity within the peritoneal fat. For example, image number 64 of series 2 Musculoskeletal: Diffuse all osseous sclerosis compatible secondary to metastatic disease is again identified.  IMPRESSION: 1. Index liver lesions are slightly decreased in size from previous exam. 2. Widespread sclerotic bone metastasis as noted previously. Subjectively there may be an increase in the sclerosis which could reflect healing of bone metastases or progression of sclerotic bone metastases. 3. New ascites identified within the pelvis. Subtle areas of soft tissue nodularity within the peritoneum is concerning for development of peritoneal metastases. 4. Slight enlargement of the right ovary. Attention to the right ovary on follow-up imaging  to monitor for potential ovarian metastases. Electronically Signed   By: Kerby Moors M.D.   On: 10/11/2016 11:59   Mr Jeri Cos [id number] Contrast  Result Date: 10/12/2016 CLINICAL DATA:  60 year old female with metastatic breast cancer. Staging. Subsequent encounter. EXAM: MRI HEAD WITHOUT AND WITH CONTRAST TECHNIQUE: Multiplanar, multiecho pulse sequences of the brain and surrounding structures were obtained without and with intravenous contrast. CONTRAST:  53mL MULTIHANCE GADOBENATE DIMEGLUMINE 529 MG/ML IV SOLN COMPARISON:  PET-CT 11/14/2015. FINDINGS: Brain: Numerous small brain metastases. There are at least 15 small supratentorial metastases ranging from punctate to 10 mm diameter. There is also extensive metastatic disease affecting the cerebellum with widespread small areas of abnormal enhancement, many of which are suspicious for leptomeningeal disease (series 16 images 14 and 15). The largest individual cerebellar lesions are 8-9 mm. Despite the extensive disease there is only occasional mild cerebral and cerebellar edema, and no significant intracranial mass effect. No hemorrhagic metastases. No abnormal ependymoma 1 enhancement. No pachymeningeal thickening or enhancement. No restricted diffusion or evidence of acute infarction. No ventriculomegaly. No acute intracranial hemorrhage identified. Negative pituitary. Negative cervicomedullary junction. No chronic cerebral  blood products identified. There are small chronic lacunar infarcts in the left cerebellum. Subtle chronic cortical encephalomalacia in the right parietal lobe (series 9, image 21) compatible with previous small posterior right MCA infarct. Vascular: Major intracranial vascular flow voids are preserved, the distal left vertebral artery appears dominant. Skull and upper cervical spine: Diffuse abnormal bone marrow signal throughout the cervical spine and at the skullbase in keeping with widespread osseous metastatic disease. No destructive calvarial lesion identified. Negative visualized cervical spinal cord. Sinuses/Orbits: Abnormal heterogeneous appearance of the bilateral intraorbital soft tissues including the right lateral rectus muscle which is expanded in T2 hyperintense (series 14, image 20 and series 9, image 9). Similar heterogeneity throughout the left orbital soft tissues. There seems to be a mild degree of enophthalmos. The globes appear remain intact. Trace paranasal sinus mucosal thickening. Other: Mastoids are clear. Visible internal auditory structures appear normal. Negative scalp soft tissues. IMPRESSION: 1. Widespread metastatic disease to the brain. At least 15 small supratentorial metastases ranging from punctate to 10 mm diameter, and extensive cerebellar metastatic disease which appears highly suspicious for leptomeningeal metastases. 2. Only mild scattered brain edema and no significant intracranial mass effect. 3. Bilateral orbit soft tissue metastases (scirrhous infiltration of the orbits). 4. Widespread osseous metastatic disease. Electronically Signed   By: Genevie Ann M.D.   On: 10/12/2016 13:18   Mr Thoracic Spine W Wo Contrast  Result Date: 10/23/2016 CLINICAL DATA:  Metastatic breast cancer. EXAM: MRI THORACIC AND LUMBAR SPINE WITHOUT AND WITH CONTRAST TECHNIQUE: Multiplanar and multiecho pulse sequences of the thoracic and lumbar spine were obtained without and with intravenous  contrast. CONTRAST:  66mL MULTIHANCE GADOBENATE DIMEGLUMINE 529 MG/ML IV SOLN COMPARISON:  Bone scan 08/29/2016 FINDINGS: MRI THORACIC SPINE FINDINGS Alignment:  Physiologic. Vertebrae: Heterogeneous marrow diffusely from extensive metastatic disease. Every thoracic vertebra is involved. No extraosseous tumor growth is noted in the epidural or foraminal spaces. No pathologic fracture. Cord: The cord has normal signal, but there is diffuse surface enhancement of the cord and intrathecal roots. Paraspinal and other soft tissues: Known hepatic metastatic disease. Trace right pleural effusion. Disc levels: No degenerative impingement MRI LUMBAR SPINE FINDINGS Segmentation:  Standard. Alignment:  Physiologic. Vertebrae: Metastases throughout all lumbar levels. No pathologic fracture or epidural/ foraminal infiltration. Conus medullaris: Extends to the T12 level and appears normal. There is diffuse thickening  enhancement of the cauda equina Paraspinal and other soft tissues: The bladder is over distended and there is bilateral moderate hydroureteronephrosis. Disc levels: Lower lumbar facet arthropathy.  No degenerative impingement. These results will be called to the ordering clinician or representative by the Radiologist Assistant, and communication documented in the PACS or zVision Dashboard. IMPRESSION: 1. 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 2. Widespread osseous metastatic disease without pathologic fracture or detected epidural infiltration. 3. Over distended bladder with bilateral hydronephrosis, correlate for retention related to #1. 4. Known hepatic metastatic disease. Electronically Signed   By: Monte Fantasia M.D.   On: 10/23/2016 13:37   Mr Lumbar Spine W Wo Contrast  Result Date: 10/23/2016 CLINICAL DATA:  Metastatic breast cancer. EXAM: MRI THORACIC AND LUMBAR SPINE WITHOUT AND WITH CONTRAST TECHNIQUE: Multiplanar and  multiecho pulse sequences of the thoracic and lumbar spine were obtained without and with intravenous contrast. CONTRAST:  55mL MULTIHANCE GADOBENATE DIMEGLUMINE 529 MG/ML IV SOLN COMPARISON:  Bone scan 08/29/2016 FINDINGS: MRI THORACIC SPINE FINDINGS Alignment:  Physiologic. Vertebrae: Heterogeneous marrow diffusely from extensive metastatic disease. Every thoracic vertebra is involved. No extraosseous tumor growth is noted in the epidural or foraminal spaces. No pathologic fracture. Cord: The cord has normal signal, but there is diffuse surface enhancement of the cord and intrathecal roots. Paraspinal and other soft tissues: Known hepatic metastatic disease. Trace right pleural effusion. Disc levels: No degenerative impingement MRI LUMBAR SPINE FINDINGS Segmentation:  Standard. Alignment:  Physiologic. Vertebrae: Metastases throughout all lumbar levels. No pathologic fracture or epidural/ foraminal infiltration. Conus medullaris: Extends to the T12 level and appears normal. There is diffuse thickening enhancement of the cauda equina Paraspinal and other soft tissues: The bladder is over distended and there is bilateral moderate hydroureteronephrosis. Disc levels: Lower lumbar facet arthropathy.  No degenerative impingement. These results will be called to the ordering clinician or representative by the Radiologist Assistant, and communication documented in the PACS or zVision Dashboard. IMPRESSION: 1. 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 2. Widespread osseous metastatic disease without pathologic fracture or detected epidural infiltration. 3. Over distended bladder with bilateral hydronephrosis, correlate for retention related to #1. 4. Known hepatic metastatic disease. Electronically Signed   By: Monte Fantasia M.D.   On: 10/23/2016 13:37   Ct Abdomen Pelvis W Contrast  Result Date: 10/11/2016 CLINICAL DATA:  Metastatic breast  cancer. EXAM: CT CHEST, ABDOMEN, AND PELVIS WITH CONTRAST TECHNIQUE: Multidetector CT imaging of the chest, abdomen and pelvis was performed following the standard protocol during bolus administration of intravenous contrast. CONTRAST:  123mL ISOVUE-300 IOPAMIDOL (ISOVUE-300) INJECTION 61% COMPARISON:  08/29/2016 FINDINGS: CT CHEST FINDINGS Cardiovascular: The heart size appears normal. Aortic atherosclerosis noted. Mediastinum/Nodes: The trachea appears patent and is midline. Normal appearance of the esophagus. Right axillary node measures 9 mm, image number 18 of series 2. Previously this measured the same. Interval decrease in soft tissue stranding within the right axilla which may be related to prior instrumentation. No enlarged mediastinal or hilar nodes. Lungs/Pleura: Small right pleural effusion. Mild diffuse bronchial wall thickening noted. Left upper lobe pulmonary nodule is unchanged measuring 4 mm, image 56 of series 4. Perifissural nodule in the left lower lobe is stable measuring 4 mm, image number 80 of series 4. No new or enlarging pulmonary nodules identified Musculoskeletal: Extensive, widespread sclerotic bone metastases again noted. Subjectively increased to previous exam. CT ABDOMEN PELVIS FINDINGS Hepatobiliary: Multifocal liver  metastases again identified. Index lesion within the right lobe measures 2.6 cm, image 41 of series 2. Previously this measured the same. Lateral segment of left lobe of liver lesion measures 1.5 cm, image 49 of series 2. Previously 2.1 cm. The index lesion within the lateral right lobe of liver measures 2 cm, image 51 of series 2. Previously 2.3 cm. Index lesion within the inferior right lobe measures 2.4 cm, image 59 of series 2. Previously 2.6 cm. Gallbladder appears normal. No biliary dilatation. Pancreas: Normal appearance of the pancreas. Spleen: No change in small low density structure within the spleen measuring 6 mm, image 51 of series 2. Adrenals/Urinary Tract:  The adrenal glands are normal. There is persistent bilateral pelvocaliectasis. No hydroureter. Enhancing nodule along the anterior wall of the bladder is again noted, image 81 89 of series 6. Stomach/Bowel: The stomach is normal. The small bowel loops have a normal caliber. No bowel obstruction. No pathologic dilatation of the colon. Increase scratch set there is mild thickening of the appendix which measures 8 mm in diameter, image 77 of series 2. No free fluid or fluid collections noted within in the right lower quadrant. Vascular/Lymphatic: Normal appearance of the abdominal aorta. No enlarged upper abdominal lymph nodes. There is no pelvic or inguinal adenopathy. Reproductive: The uterus appears unremarkable. The right ovary measures 2.8 x 2.0 cm, image 89 of series 2. Previously 2.8 x 1.5 cm. Other: There is a small amount of free fluid identified within the pelvis. This is new from previous exam. Additionally, there is a suggestion subtle soft tissue nodularity within the peritoneal fat. For example, image number 64 of series 2 Musculoskeletal: Diffuse all osseous sclerosis compatible secondary to metastatic disease is again identified. IMPRESSION: 1. Index liver lesions are slightly decreased in size from previous exam. 2. Widespread sclerotic bone metastasis as noted previously. Subjectively there may be an increase in the sclerosis which could reflect healing of bone metastases or progression of sclerotic bone metastases. 3. New ascites identified within the pelvis. Subtle areas of soft tissue nodularity within the peritoneum is concerning for development of peritoneal metastases. 4. Slight enlargement of the right ovary. Attention to the right ovary on follow-up imaging to monitor for potential ovarian metastases. Electronically Signed   By: Kerby Moors M.D.   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 Continue current steroids. Continue with radiation. Discussed with Dr. Crystal/informed about the change in her clinical status. Discussed with neurosurgery yesterday regarding O Meier port placement- could be considered if patient's clinical status improves..  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 Given the change in clinical status; hold port. A  # Pain control- continue IV morphine every 4-6 hours.  # Oral thrush recommend nystatin.  # The overall poor prognosis was discussed with the patient and her friend/and also her father especially given the significant decline over the last 24 hours.   Cammie Sickle, MD 10/25/2016 8:52 AM

## 2016-10-25 NOTE — Progress Notes (Signed)
[facility] NAME: Elizabeth Mccoy    MR#:  [id number]  DATE OF BIRTH:  09/22/1956  SUBJECTIVE: seen at bedside.pt feeling weaker and poor po intake.according to family weakness getting worse in both arms and legs.poor po intake.as per family significant decline since yesterday.not interactive like last 2 days.curled up and looks weaker.  CHIEF COMPLAINT:   Chief Complaint  Patient presents with  . Nausea  . Dizziness   Still has nausea.Marland Kitchen  REVIEW OF SYSTEMS:    Review of Systems  Constitutional: Positive for malaise/fatigue. Negative for chills and fever.  HENT: Negative for hearing loss and sore throat.   Eyes: Negative for blurred vision, double vision, photophobia and pain.  Respiratory: Negative for cough, hemoptysis, shortness of breath and wheezing.   Cardiovascular: Negative for chest pain, palpitations, orthopnea and leg swelling.  Gastrointestinal: Positive for nausea. Negative for abdominal pain, constipation, diarrhea, heartburn and vomiting.  Genitourinary: Negative for dysuria, hematuria and urgency.  Musculoskeletal: Negative for back pain, joint pain, myalgias and neck pain.  Skin: Negative for rash.  Neurological: Positive for dizziness, focal weakness and weakness. Negative for sensory change, speech change, seizures and headaches.  Endo/Heme/Allergies: Does not bruise/bleed easily.  Psychiatric/Behavioral: Negative for depression and memory loss. The patient is not nervous/anxious and does not have insomnia.     DRUG ALLERGIES:   Allergies  Allergen Reactions  . Beef Extract Anaphylaxis  . Pork (Porcine) Protein Anaphylaxis  . Beef-Derived Products Hives    VITALS:  Blood pressure (!) 161/66, pulse 63, temperature 97.8 F (36.6 C), temperature source Oral, resp. rate 16, height 5\' 1"  (1.549 m), weight 52.9 kg (116 lb 11.2 oz), SpO2 97 %.  PHYSICAL EXAMINATION:   Physical Exam  GENERAL:  60  y.o.-year-old patient lying in the bed with no acute distress. Appears cachectic. EYES: Pupils equal, round, reactive to light and accommodation. No scleral icterus. Extraocular muscles intact.  HEENT: Head atraumatic, normocephalic. Oropharynx and nasopharynx clear.  NECK:  Supple, no jugular venous distention. No thyroid enlargement, no tenderness.  LUNGS: Normal breath sounds bilaterally, no wheezing, rales, rhonchi. No use of accessory muscles of respiration.  CARDIOVASCULAR: S1, S2 normal. No murmurs, rubs, or gallops.  ABDOMEN: Soft, nontender, nondistended. Bowel sounds present. No organomegaly or mass.  EXTREMITIES: No cyanosis, clubbing or edema b/l.    NEUROLOGIC: Patient is weak on right upper and lower extremities. PSYCHIATRIC: The patient is alert and oriented, SKIN: No obvious rash, lesion, or ulcer.   LABORATORY PANEL:   CBC  Recent Labs Lab 10/22/16 1936  WBC 19.2*  HGB 12.7  HCT 37.5  PLT 143*   ------------------------------------------------------------------------------------------------------------------ Chemistries   Recent Labs Lab 10/21/16 1458  10/23/16 0457  NA 127*  < > 133*  K 4.4  < > 4.4  CL 97*  < > 104  CO2 19*  < > 21*  GLUCOSE 117*  < > 135*  BUN 17  < > 12  CREATININE 0.48  < > 0.40*  CALCIUM 7.8*  < > 7.4*  MG 2.4  --   --   AST 54*  --  55*  ALT 64*  --  81*  ALKPHOS 229*  --  164*  BILITOT 1.0  --  0.8  < > = values in this interval not displayed. ------------------------------------------------------------------------------------------------------------------  Cardiac Enzymes No results for input(s): TROPONINI in the last 168 hours. ------------------------------------------------------------------------------------------------------------------  RADIOLOGY:  Mr Thoracic Spine W Wo Contrast  Result Date:  10/23/2016 CLINICAL DATA:  Metastatic breast cancer. EXAM: MRI THORACIC AND LUMBAR SPINE WITHOUT AND WITH CONTRAST  TECHNIQUE: Multiplanar and multiecho pulse sequences of the thoracic and lumbar spine were obtained without and with intravenous contrast. CONTRAST:  30mL MULTIHANCE GADOBENATE DIMEGLUMINE 529 MG/ML IV SOLN COMPARISON:  Bone scan 08/29/2016 FINDINGS: MRI THORACIC SPINE FINDINGS Alignment:  Physiologic. Vertebrae: Heterogeneous marrow diffusely from extensive metastatic disease. Every thoracic vertebra is involved. No extraosseous tumor growth is noted in the epidural or foraminal spaces. No pathologic fracture. Cord: The cord has normal signal, but there is diffuse surface enhancement of the cord and intrathecal roots. Paraspinal and other soft tissues: Known hepatic metastatic disease. Trace right pleural effusion. Disc levels: No degenerative impingement MRI LUMBAR SPINE FINDINGS Segmentation:  Standard. Alignment:  Physiologic. Vertebrae: Metastases throughout all lumbar levels. No pathologic fracture or epidural/ foraminal infiltration. Conus medullaris: Extends to the T12 level and appears normal. There is diffuse thickening enhancement of the cauda equina Paraspinal and other soft tissues: The bladder is over distended and there is bilateral moderate hydroureteronephrosis. Disc levels: Lower lumbar facet arthropathy.  No degenerative impingement. These results will be called to the ordering clinician or representative by the Radiologist Assistant, and communication documented in the PACS or zVision Dashboard. IMPRESSION: 1. 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 2. Widespread osseous metastatic disease without pathologic fracture or detected epidural infiltration. 3. Over distended bladder with bilateral hydronephrosis, correlate for retention related to #1. 4. Known hepatic metastatic disease. Electronically Signed   By: Monte Fantasia M.D.   On: 10/23/2016 13:37   Mr Lumbar Spine W Wo Contrast  Result Date: 10/23/2016 CLINICAL  DATA:  Metastatic breast cancer. EXAM: MRI THORACIC AND LUMBAR SPINE WITHOUT AND WITH CONTRAST TECHNIQUE: Multiplanar and multiecho pulse sequences of the thoracic and lumbar spine were obtained without and with intravenous contrast. CONTRAST:  70mL MULTIHANCE GADOBENATE DIMEGLUMINE 529 MG/ML IV SOLN COMPARISON:  Bone scan 08/29/2016 FINDINGS: MRI THORACIC SPINE FINDINGS Alignment:  Physiologic. Vertebrae: Heterogeneous marrow diffusely from extensive metastatic disease. Every thoracic vertebra is involved. No extraosseous tumor growth is noted in the epidural or foraminal spaces. No pathologic fracture. Cord: The cord has normal signal, but there is diffuse surface enhancement of the cord and intrathecal roots. Paraspinal and other soft tissues: Known hepatic metastatic disease. Trace right pleural effusion. Disc levels: No degenerative impingement MRI LUMBAR SPINE FINDINGS Segmentation:  Standard. Alignment:  Physiologic. Vertebrae: Metastases throughout all lumbar levels. No pathologic fracture or epidural/ foraminal infiltration. Conus medullaris: Extends to the T12 level and appears normal. There is diffuse thickening enhancement of the cauda equina Paraspinal and other soft tissues: The bladder is over distended and there is bilateral moderate hydroureteronephrosis. Disc levels: Lower lumbar facet arthropathy.  No degenerative impingement. These results will be called to the ordering clinician or representative by the Radiologist Assistant, and communication documented in the PACS or zVision Dashboard. IMPRESSION: 1. 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 2. Widespread osseous metastatic disease without pathologic fracture or detected epidural infiltration. 3. Over distended bladder with bilateral hydronephrosis, correlate for retention related to #1. 4. Known hepatic metastatic disease. Electronically Signed   By: Monte Fantasia M.D.    On: 10/23/2016 13:37     ASSESSMENT AND PLAN:   * Intractable nausea and vomiting Likely from liver metastases. Patient also undergoing chemotherapy. On iv pepcid,iv fluids.po intake is poor.  * Elevated lipase No  abdominal tenderness. Lipase has returned to normal. Possible pancreatitis but CT scan normal. Monitor.  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 Continue Decadron, radiation therapy, patient needs new port for chemotherapy Her clinical condition is worse today,sleeping a lot since yesterday,poor po intake,not able to walk to bath room;oncology to decide on this, Because of the metastatic breast cancer with metastases to bone, liver, brain prognosis is very poor. Family is aware.   * Hypovolemic hyponatremia;poor po intake;continue iv fluids. Poor prognosis,  All the records are reviewed and case discussed with Care Management/Social Workerr. Management plans discussed with the patient, family and they are in agreement.  CODE STATUS: FULL CODE  DVT Prophylaxis: SCDs  TOTAL TIME TAKING CARE OF THIS PATIENT: 35 minutes.   POSSIBLE D/C IN 1-2 DAYS, DEPENDING ON CLINICAL CONDITION.  Epifanio Lesches M.D on 10/25/2016 at 9:43 AM  Between 7am to 6pm - Pager - [phone]  After 6pm go to [email or web address] - password [email or web address]  Office  [phone]  CC: Primary care physician; Gerilyn Pilgrim, FNP  Note: This dictation was prepared with Dragon dictation along with smaller phrase technology. Any transcriptional errors that result from this process are unintentional.

## 2016-10-25 NOTE — Progress Notes (Signed)
Discussed with multiple family members; concerned re: lack of nutrition. Would NOT recommend PEG tube. Alternatively, recommend TPN/PICC. Hold mediport for now given significant decline in PS. Spoke to Dr.Oaks. Poor prognosis. Will discuss with husband in morning.

## 2016-10-26 ENCOUNTER — Inpatient Hospital Stay: Payer: BLUE CROSS/BLUE SHIELD

## 2016-10-26 ENCOUNTER — Ambulatory Visit: Payer: BLUE CROSS/BLUE SHIELD

## 2016-10-26 ENCOUNTER — Ambulatory Visit
Admission: RE | Admit: 2016-10-26 | Discharge: 2016-10-26 | Disposition: A | Discharge status: Home / Self Care | Payer: BLUE CROSS/BLUE SHIELD | Source: Ambulatory Visit | Attending: Radiation Oncology | Admitting: Radiation Oncology

## 2016-10-26 ENCOUNTER — Inpatient Hospital Stay: Payer: BLUE CROSS/BLUE SHIELD | Admitting: Internal Medicine

## 2016-10-26 DIAGNOSIS — C50812 Malignant neoplasm of overlapping sites of left female breast: Secondary | ICD-10-CM | POA: Diagnosis not present

## 2016-10-26 DIAGNOSIS — R748 Abnormal levels of other serum enzymes: Secondary | ICD-10-CM

## 2016-10-26 LAB — CBC
HCT: 35.6 % (ref 35.0–47.0)
HEMOGLOBIN: 12 g/dL (ref 12.0–16.0)
MCH: 32 pg (ref 26.0–34.0)
MCHC: 33.6 g/dL (ref 32.0–36.0)
MCV: 95.2 fL (ref 80.0–100.0)
Platelets: 150 10*3/uL (ref 150–440)
RBC: 3.74 MIL/uL — ABNORMAL LOW (ref 3.80–5.20)
RDW: 19.3 % — ABNORMAL HIGH (ref 11.5–14.5)
WBC: 14.3 10*3/uL — ABNORMAL HIGH (ref 3.6–11.0)

## 2016-10-26 LAB — BASIC METABOLIC PANEL
Anion gap: 8 (ref 5–15)
BUN: 13 mg/dL (ref 6–20)
CHLORIDE: 101 mmol/L (ref 101–111)
CO2: 21 mmol/L — AB (ref 22–32)
CREATININE: 0.37 mg/dL — AB (ref 0.44–1.00)
Calcium: 6.9 mg/dL — ABNORMAL LOW (ref 8.9–10.3)
GFR calc Af Amer: >60 mL/min (ref >60)
GFR calc non Af Amer: >60 mL/min (ref >60)
Glucose, Bld: 133 mg/dL — ABNORMAL HIGH (ref 65–99)
Potassium: 3.9 mmol/L (ref 3.5–5.1)
Sodium: 130 mmol/L — ABNORMAL LOW (ref 135–145)

## 2016-10-26 LAB — HEPATIC FUNCTION PANEL
ALT: 217 U/L — AB (ref 14–54)
AST: 85 U/L — AB (ref 15–41)
Albumin: 3.5 g/dL (ref 3.5–5.0)
Alkaline Phosphatase: 164 U/L — ABNORMAL HIGH (ref 38–126)
BILIRUBIN DIRECT: 1.4 mg/dL — AB (ref 0.1–0.5)
BILIRUBIN TOTAL: 2.7 mg/dL — AB (ref 0.3–1.2)
Indirect Bilirubin: 1.3 mg/dL — ABNORMAL HIGH (ref 0.3–0.9)
Total Protein: 6.3 g/dL — ABNORMAL LOW (ref 6.5–8.1)

## 2016-10-26 LAB — MAGNESIUM: Magnesium: 2.4 mg/dL (ref 1.7–2.4)

## 2016-10-26 LAB — GLUCOSE, CAPILLARY
Glucose-Capillary: 152 mg/dL — ABNORMAL HIGH (ref 65–99)
Glucose-Capillary: 159 mg/dL — ABNORMAL HIGH (ref 65–99)

## 2016-10-26 LAB — PHOSPHORUS: Phosphorus: 1.4 mg/dL — ABNORMAL LOW (ref 2.5–4.6)

## 2016-10-26 MED ORDER — LEVOFLOXACIN IN D5W 500 MG/100ML IV SOLN
500.0000 mg | INTRAVENOUS | Status: DC
  Administered 2016-10-26 – 2016-10-27 (×2): 500 mg via INTRAVENOUS
  Filled 2016-10-26 (×2): qty 100

## 2016-10-26 MED ORDER — PIPERACILLIN-TAZOBACTAM 3.375 G IVPB: 3.3750 g | Freq: Three times a day (TID) | INTRAVENOUS | Status: DC

## 2016-10-26 MED ORDER — POTASSIUM PHOSPHATES 15 MMOLE/5ML IV SOLN
30.0000 mmol | Freq: Once | INTRAVENOUS | Status: AC
  Administered 2016-10-26: 30 mmol via INTRAVENOUS
  Filled 2016-10-26: qty 10

## 2016-10-26 MED ORDER — VITAL 1.5 CAL PO LIQD: 1000.0000 mL | ORAL | Status: DC

## 2016-10-26 MED ORDER — JEVITY 1.2 CAL PO LIQD
1000.0000 mL | ORAL | Status: DC
  Administered 2016-10-26: 20:00:00 1000 mL

## 2016-10-26 MED ORDER — PIPERACILLIN-TAZOBACTAM 3.375 G IVPB
3.3750 g | Freq: Three times a day (TID) | INTRAVENOUS | Status: DC
  Administered 2016-10-26 – 2016-10-31 (×15): 3.375 g via INTRAVENOUS
  Filled 2016-10-26 (×16): qty 50

## 2016-10-26 NOTE — Progress Notes (Signed)
[facility] NOTE  Patient Care Team: Gerilyn Pilgrim, FNP as PCP - General (Nurse Practitioner)  CHIEF COMPLAINTS/PURPOSE OF CONSULTATION:  Metastatic breast cancer  CC: Patient resting; patient was agitated yesterday; improvement noted after Foley catheter placement. Patient slept okay last night. Needing morphine every 4-6 hours. She was not able to tolerate radiation yesterday because of agitation.  Poor appetite positive for nausea. No vomiting. Continues to have weakness in the extremities.   MEDICAL HISTORY:  Past Medical History:  Diagnosis Date  . Breast cancer, right ([place]) 2015   rad tx's on right hip.  . Menopause 12/10/2006   2008    SURGICAL HISTORY: Past Surgical History:  Procedure Laterality Date  . CESAREAN SECTION      SOCIAL HISTORY: Social History   Social History  . Marital status: Married    Spouse name: N/A  . Number of children: 2  . Years of education: N/A   Occupational History  . Not on file.   Social History Main Topics  . Smoking status: Never Smoker  . Smokeless tobacco: Never Used  . Alcohol use No  . Drug use: No  . Sexual activity: Not on file   Other Topics Concern  . Not on file   Social History Narrative  . No narrative on file    FAMILY HISTORY: Family History  Problem Relation Age of Onset  . Cancer Father     prostate ca  . Cancer Sister     cancer unknown primary    ALLERGIES:  is allergic to beef extract; pork (porcine) protein; and beef-derived products.  MEDICATIONS:  Current Facility-Administered Medications  Medication Dose Route Frequency Provider Last Rate Last Dose  . 0.9 %  sodium chloride infusion   Intravenous Continuous Epifanio Lesches, MD 50 mL/hr at 10/25/16 1021    . acetaminophen (TYLENOL) tablet 650 mg  650 mg Oral Q6H PRN Hillary Bow, MD   650 mg at 10/23/16 2138  . dexamethasone (DECADRON) injection 4 mg  4 mg Intravenous Q6H Baxter Hire, MD   4 mg at 10/26/16  0615  . dronabinol (MARINOL) capsule 5 mg  5 mg Oral BID AC Baxter Hire, MD   5 mg at 10/24/16 1144  . famotidine (PEPCID) IVPB 20 mg premix  20 mg Intravenous Q12H Epifanio Lesches, MD   20 mg at 10/25/16 2236  . feeding supplement (ENSURE ENLIVE) (ENSURE ENLIVE) liquid 237 mL  237 mL Oral BID BM Srikar Sudini, MD      . haloperidol lactate (HALDOL) injection 2.5 mg  2.5 mg Intramuscular Once Epifanio Lesches, MD      . morphine 4 MG/ML injection 2 mg  2 mg Intravenous Q2H PRN Hillary Bow, MD   2 mg at 10/26/16 0353  . nystatin (MYCOSTATIN) 100000 UNIT/ML suspension 500,000 Units  5 mL Oral QID Cammie Sickle, MD   500,000 Units at 10/25/16 2236  . ondansetron (ZOFRAN) 8 mg in sodium chloride 0.9 % 50 mL IVPB  8 mg Intravenous Q6H Baxter Hire, MD   8 mg at 10/26/16 0353  . ondansetron (ZOFRAN) injection 4 mg  4 mg Intravenous Q6H PRN Hillary Bow, MD   4 mg at 10/25/16 0839  . oxyCODONE (Oxy IR/ROXICODONE) immediate release tablet 5 mg  5 mg Oral Q4H PRN Hillary Bow, MD   5 mg at 10/22/16 2117  . polyethylene glycol (MIRALAX / GLYCOLAX) packet 17 g  17 g Oral Daily Epifanio Lesches, MD      .  promethazine (PHENERGAN) injection 25 mg  25 mg Intravenous Q6H PRN Epifanio Lesches, MD   25 mg at 10/25/16 1308  . senna-docusate (Senokot-S) tablet 1 tablet  1 tablet Oral BID Epifanio Lesches, MD   1 tablet at 10/24/16 1144      .  PHYSICAL EXAMINATION:  Vitals:   10/25/16 2001 10/26/16 0506  BP: (!) 155/80 134/71  Pulse: 92 74  Resp: 18 17  Temp: 98.7 F (37.1 C) 98.3 F (36.8 C)   Filed Weights   10/21/16 1455 10/21/16 1930  Weight: 123 lb (55.8 kg) 116 lb 11.2 oz (52.9 kg)    GENERAL: Moderate built moderately nourished female patient resting in the bed..   Accompanied by her father/friend. More lucid today.  EYES: no pallor or icterus OROPHARYNX: no thrush or ulceration. NECK: supple, no masses felt LYMPH:  no palpable lymphadenopathy in the  cervical, axillary or inguinal regions LUNGS: decreased breath sounds to auscultation at bases and  No wheeze or crackles HEART/CVS: regular rate & rhythm and no murmurs; No lower extremity edema ABDOMEN: abdomen soft, non-tender and normal bowel sounds Musculoskeletal:no cyanosis of digits and no clubbing  PSYCH: Sleepy; arousable easily. More lucid today.  NEURO:  Sleepy; but oriented x3;  werakness 4/5 left UE; 3/5 weakness in Right LE SKIN:  no rashes or significant lesions  LABORATORY DATA:  I have reviewed the data as listed Lab Results  Component Value Date   WBC 14.3 (H) 10/26/2016   HGB 12.0 10/26/2016   HCT 35.6 10/26/2016   MCV 95.2 10/26/2016   PLT 150 10/26/2016    Recent Labs  10/21/16 1458 10/22/16 0335 10/23/16 0457 10/26/16 0356  NA 127* 131* 133* 130*  K 4.4 4.6 4.4 3.9  CL 97* 104 104 101  CO2 19* 20* 21* 21*  GLUCOSE 117* 128* 135* 133*  BUN 17 13 12 13   CREATININE 0.48 0.40* 0.40* 0.37*  CALCIUM 7.8* 7.5* 7.4* 6.9*  GFRNONAA >60 >60 >60 >60  GFRAA >60 >60 >60 >60  PROT 7.1  --  6.3* 6.3*  ALBUMIN 4.2  --  3.7 3.5  AST 54*  --  55* 85*  ALT 64*  --  81* 217*  ALKPHOS 229*  --  164* 164*  BILITOT 1.0  --  0.8 2.7*  BILIDIR  --   --   --  1.4*  IBILI  --   --   --  1.3*    RADIOGRAPHIC STUDIES: I have personally reviewed the radiological images as listed and agreed with the findings in the report. Ct Head Wo Contrast  Result Date: 10/21/2016 CLINICAL DATA:  Metastatic breast cancer. EXAM: CT HEAD WITHOUT CONTRAST TECHNIQUE: Contiguous axial images were obtained from the base of the skull through the vertex without intravenous contrast. COMPARISON:  MRI head 10/12/2016 FINDINGS: Brain: Progressive ventricular dilatation since the prior MRI. This is most consistent with hydrocephalus in this patient with metastatic disease and may be due to leptomeningeal tumor. No obstructing mass lesion. Multiple small metastatic deposits are seen throughout the  brain on the MRI. These are not well seen on unenhanced CT however there is mild edema in the right occipital lobe and in the right frontal lobe over the convexity consistent with metastatic disease. Negative for hemorrhage. Vascular: Negative for dense MCA Skull: Patchy sclerotic disease in the clivus compatible with metastatic disease. No destructive skull lesion. Sinuses/Orbits: Infiltration of the orbit with enlargement of the extra-ocular muscles most consistent with metastatic disease to the  orbit as identified on MRI. Other: None IMPRESSION: Progressive ventricular dilatation since the recent MRI compatible with hydrocephalus. 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 Multiple brain metastasis better seen on MRI. No large obstructing mass lesion is identified. Negative for hemorrhage Metastatic disease to the clivus. Metastatic disease to the orbit bilaterally. Electronically Signed   By: Franchot Gallo M.D.   On: 10/21/2016 17:22   Ct Chest W Contrast  Result Date: 10/11/2016 CLINICAL DATA:  Metastatic breast cancer. EXAM: CT CHEST, ABDOMEN, AND PELVIS WITH CONTRAST TECHNIQUE: Multidetector CT imaging of the chest, abdomen and pelvis was performed following the standard protocol during bolus administration of intravenous contrast. CONTRAST:  141mL ISOVUE-300 IOPAMIDOL (ISOVUE-300) INJECTION 61% COMPARISON:  08/29/2016 FINDINGS: CT CHEST FINDINGS Cardiovascular: The heart size appears normal. Aortic atherosclerosis noted. Mediastinum/Nodes: The trachea appears patent and is midline. Normal appearance of the esophagus. Right axillary node measures 9 mm, image number 18 of series 2. Previously this measured the same. Interval decrease in soft tissue stranding within the right axilla which may be related to prior instrumentation. No enlarged mediastinal or hilar nodes. Lungs/Pleura: Small right pleural effusion. Mild diffuse bronchial wall thickening noted. Left upper  lobe pulmonary nodule is unchanged measuring 4 mm, image 56 of series 4. Perifissural nodule in the left lower lobe is stable measuring 4 mm, image number 80 of series 4. No new or enlarging pulmonary nodules identified Musculoskeletal: Extensive, widespread sclerotic bone metastases again noted. Subjectively increased to previous exam. CT ABDOMEN PELVIS FINDINGS Hepatobiliary: Multifocal liver metastases again identified. Index lesion within the right lobe measures 2.6 cm, image 41 of series 2. Previously this measured the same. Lateral segment of left lobe of liver lesion measures 1.5 cm, image 49 of series 2. Previously 2.1 cm. The index lesion within the lateral right lobe of liver measures 2 cm, image 51 of series 2. Previously 2.3 cm. Index lesion within the inferior right lobe measures 2.4 cm, image 59 of series 2. Previously 2.6 cm. Gallbladder appears normal. No biliary dilatation. Pancreas: Normal appearance of the pancreas. Spleen: No change in small low density structure within the spleen measuring 6 mm, image 51 of series 2. Adrenals/Urinary Tract: The adrenal glands are normal. There is persistent bilateral pelvocaliectasis. No hydroureter. Enhancing nodule along the anterior wall of the bladder is again noted, image 81 89 of series 6. Stomach/Bowel: The stomach is normal. The small bowel loops have a normal caliber. No bowel obstruction. No pathologic dilatation of the colon. Increase scratch set there is mild thickening of the appendix which measures 8 mm in diameter, image 77 of series 2. No free fluid or fluid collections noted within in the right lower quadrant. Vascular/Lymphatic: Normal appearance of the abdominal aorta. No enlarged upper abdominal lymph nodes. There is no pelvic or inguinal adenopathy. Reproductive: The uterus appears unremarkable. The right ovary measures 2.8 x 2.0 cm, image 89 of series 2. Previously 2.8 x 1.5 cm. Other: There is a small amount of free fluid identified within  the pelvis. This is new from previous exam. Additionally, there is a suggestion subtle soft tissue nodularity within the peritoneal fat. For example, image number 64 of series 2 Musculoskeletal: Diffuse all osseous sclerosis compatible secondary to metastatic disease is again identified. IMPRESSION: 1. Index liver lesions are slightly decreased in size from previous exam. 2. Widespread sclerotic bone metastasis as noted previously. Subjectively there may be an increase in the sclerosis which could reflect healing of bone  metastases or progression of sclerotic bone metastases. 3. New ascites identified within the pelvis. Subtle areas of soft tissue nodularity within the peritoneum is concerning for development of peritoneal metastases. 4. Slight enlargement of the right ovary. Attention to the right ovary on follow-up imaging to monitor for potential ovarian metastases. Electronically Signed   By: Kerby Moors M.D.   On: 10/11/2016 11:59   Mr Jeri Cos [id number] Contrast  Result Date: 10/12/2016 CLINICAL DATA:  60 year old female with metastatic breast cancer. Staging. Subsequent encounter. EXAM: MRI HEAD WITHOUT AND WITH CONTRAST TECHNIQUE: Multiplanar, multiecho pulse sequences of the brain and surrounding structures were obtained without and with intravenous contrast. CONTRAST:  36mL MULTIHANCE GADOBENATE DIMEGLUMINE 529 MG/ML IV SOLN COMPARISON:  PET-CT 11/14/2015. FINDINGS: Brain: Numerous small brain metastases. There are at least 15 small supratentorial metastases ranging from punctate to 10 mm diameter. There is also extensive metastatic disease affecting the cerebellum with widespread small areas of abnormal enhancement, many of which are suspicious for leptomeningeal disease (series 16 images 14 and 15). The largest individual cerebellar lesions are 8-9 mm. Despite the extensive disease there is only occasional mild cerebral and cerebellar edema, and no significant intracranial mass effect. No hemorrhagic  metastases. No abnormal ependymoma 1 enhancement. No pachymeningeal thickening or enhancement. No restricted diffusion or evidence of acute infarction. No ventriculomegaly. No acute intracranial hemorrhage identified. Negative pituitary. Negative cervicomedullary junction. No chronic cerebral blood products identified. There are small chronic lacunar infarcts in the left cerebellum. Subtle chronic cortical encephalomalacia in the right parietal lobe (series 9, image 21) compatible with previous small posterior right MCA infarct. Vascular: Major intracranial vascular flow voids are preserved, the distal left vertebral artery appears dominant. Skull and upper cervical spine: Diffuse abnormal bone marrow signal throughout the cervical spine and at the skullbase in keeping with widespread osseous metastatic disease. No destructive calvarial lesion identified. Negative visualized cervical spinal cord. Sinuses/Orbits: Abnormal heterogeneous appearance of the bilateral intraorbital soft tissues including the right lateral rectus muscle which is expanded in T2 hyperintense (series 14, image 20 and series 9, image 9). Similar heterogeneity throughout the left orbital soft tissues. There seems to be a mild degree of enophthalmos. The globes appear remain intact. Trace paranasal sinus mucosal thickening. Other: Mastoids are clear. Visible internal auditory structures appear normal. Negative scalp soft tissues. IMPRESSION: 1. Widespread metastatic disease to the brain. At least 15 small supratentorial metastases ranging from punctate to 10 mm diameter, and extensive cerebellar metastatic disease which appears highly suspicious for leptomeningeal metastases. 2. Only mild scattered brain edema and no significant intracranial mass effect. 3. Bilateral orbit soft tissue metastases (scirrhous infiltration of the orbits). 4. Widespread osseous metastatic disease. Electronically Signed   By: Genevie Ann M.D.   On: 10/12/2016 13:18   Mr  Thoracic Spine W Wo Contrast  Result Date: 10/23/2016 CLINICAL DATA:  Metastatic breast cancer. EXAM: MRI THORACIC AND LUMBAR SPINE WITHOUT AND WITH CONTRAST TECHNIQUE: Multiplanar and multiecho pulse sequences of the thoracic and lumbar spine were obtained without and with intravenous contrast. CONTRAST:  1mL MULTIHANCE GADOBENATE DIMEGLUMINE 529 MG/ML IV SOLN COMPARISON:  Bone scan 08/29/2016 FINDINGS: MRI THORACIC SPINE FINDINGS Alignment:  Physiologic. Vertebrae: Heterogeneous marrow diffusely from extensive metastatic disease. Every thoracic vertebra is involved. No extraosseous tumor growth is noted in the epidural or foraminal spaces. No pathologic fracture. Cord: The cord has normal signal, but there is diffuse surface enhancement of the cord and intrathecal roots. Paraspinal and other soft tissues: Known hepatic metastatic disease. Trace  right pleural effusion. Disc levels: No degenerative impingement MRI LUMBAR SPINE FINDINGS Segmentation:  Standard. Alignment:  Physiologic. Vertebrae: Metastases throughout all lumbar levels. No pathologic fracture or epidural/ foraminal infiltration. Conus medullaris: Extends to the T12 level and appears normal. There is diffuse thickening enhancement of the cauda equina Paraspinal and other soft tissues: The bladder is over distended and there is bilateral moderate hydroureteronephrosis. Disc levels: Lower lumbar facet arthropathy.  No degenerative impingement. These results will be called to the ordering clinician or representative by the Radiologist Assistant, and communication documented in the PACS or zVision Dashboard. IMPRESSION: 1. 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 2. Widespread osseous metastatic disease without pathologic fracture or detected epidural infiltration. 3. Over distended bladder with bilateral hydronephrosis, correlate for retention related to #1. 4. Known hepatic  metastatic disease. Electronically Signed   By: Monte Fantasia M.D.   On: 10/23/2016 13:37   Mr Lumbar Spine W Wo Contrast  Result Date: 10/23/2016 CLINICAL DATA:  Metastatic breast cancer. EXAM: MRI THORACIC AND LUMBAR SPINE WITHOUT AND WITH CONTRAST TECHNIQUE: Multiplanar and multiecho pulse sequences of the thoracic and lumbar spine were obtained without and with intravenous contrast. CONTRAST:  83mL MULTIHANCE GADOBENATE DIMEGLUMINE 529 MG/ML IV SOLN COMPARISON:  Bone scan 08/29/2016 FINDINGS: MRI THORACIC SPINE FINDINGS Alignment:  Physiologic. Vertebrae: Heterogeneous marrow diffusely from extensive metastatic disease. Every thoracic vertebra is involved. No extraosseous tumor growth is noted in the epidural or foraminal spaces. No pathologic fracture. Cord: The cord has normal signal, but there is diffuse surface enhancement of the cord and intrathecal roots. Paraspinal and other soft tissues: Known hepatic metastatic disease. Trace right pleural effusion. Disc levels: No degenerative impingement MRI LUMBAR SPINE FINDINGS Segmentation:  Standard. Alignment:  Physiologic. Vertebrae: Metastases throughout all lumbar levels. No pathologic fracture or epidural/ foraminal infiltration. Conus medullaris: Extends to the T12 level and appears normal. There is diffuse thickening enhancement of the cauda equina Paraspinal and other soft tissues: The bladder is over distended and there is bilateral moderate hydroureteronephrosis. Disc levels: Lower lumbar facet arthropathy.  No degenerative impingement. These results will be called to the ordering clinician or representative by the Radiologist Assistant, and communication documented in the PACS or zVision Dashboard. IMPRESSION: 1. 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 2. Widespread osseous metastatic disease without pathologic fracture or detected epidural infiltration. 3. Over distended  bladder with bilateral hydronephrosis, correlate for retention related to #1. 4. Known hepatic metastatic disease. Electronically Signed   By: Monte Fantasia M.D.   On: 10/23/2016 13:37   Ct Abdomen Pelvis W Contrast  Result Date: 10/11/2016 CLINICAL DATA:  Metastatic breast cancer. EXAM: CT CHEST, ABDOMEN, AND PELVIS WITH CONTRAST TECHNIQUE: Multidetector CT imaging of the chest, abdomen and pelvis was performed following the standard protocol during bolus administration of intravenous contrast. CONTRAST:  134mL ISOVUE-300 IOPAMIDOL (ISOVUE-300) INJECTION 61% COMPARISON:  08/29/2016 FINDINGS: CT CHEST FINDINGS Cardiovascular: The heart size appears normal. Aortic atherosclerosis noted. Mediastinum/Nodes: The trachea appears patent and is midline. Normal appearance of the esophagus. Right axillary node measures 9 mm, image number 18 of series 2. Previously this measured the same. Interval decrease in soft tissue stranding within the right axilla which may be related to prior instrumentation. No enlarged mediastinal or hilar nodes. Lungs/Pleura: Small right pleural effusion. Mild diffuse bronchial wall thickening noted. Left upper lobe pulmonary nodule is unchanged measuring 4 mm, image 56 of series 4.  Perifissural nodule in the left lower lobe is stable measuring 4 mm, image number 80 of series 4. No new or enlarging pulmonary nodules identified Musculoskeletal: Extensive, widespread sclerotic bone metastases again noted. Subjectively increased to previous exam. CT ABDOMEN PELVIS FINDINGS Hepatobiliary: Multifocal liver metastases again identified. Index lesion within the right lobe measures 2.6 cm, image 41 of series 2. Previously this measured the same. Lateral segment of left lobe of liver lesion measures 1.5 cm, image 49 of series 2. Previously 2.1 cm. The index lesion within the lateral right lobe of liver measures 2 cm, image 51 of series 2. Previously 2.3 cm. Index lesion within the inferior right lobe  measures 2.4 cm, image 59 of series 2. Previously 2.6 cm. Gallbladder appears normal. No biliary dilatation. Pancreas: Normal appearance of the pancreas. Spleen: No change in small low density structure within the spleen measuring 6 mm, image 51 of series 2. Adrenals/Urinary Tract: The adrenal glands are normal. There is persistent bilateral pelvocaliectasis. No hydroureter. Enhancing nodule along the anterior wall of the bladder is again noted, image 81 89 of series 6. Stomach/Bowel: The stomach is normal. The small bowel loops have a normal caliber. No bowel obstruction. No pathologic dilatation of the colon. Increase scratch set there is mild thickening of the appendix which measures 8 mm in diameter, image 77 of series 2. No free fluid or fluid collections noted within in the right lower quadrant. Vascular/Lymphatic: Normal appearance of the abdominal aorta. No enlarged upper abdominal lymph nodes. There is no pelvic or inguinal adenopathy. Reproductive: The uterus appears unremarkable. The right ovary measures 2.8 x 2.0 cm, image 89 of series 2. Previously 2.8 x 1.5 cm. Other: There is a small amount of free fluid identified within the pelvis. This is new from previous exam. Additionally, there is a suggestion subtle soft tissue nodularity within the peritoneal fat. For example, image number 64 of series 2 Musculoskeletal: Diffuse all osseous sclerosis compatible secondary to metastatic disease is again identified. IMPRESSION: 1. Index liver lesions are slightly decreased in size from previous exam. 2. Widespread sclerotic bone metastasis as noted previously. Subjectively there may be an increase in the sclerosis which could reflect healing of bone metastases or progression of sclerotic bone metastases. 3. New ascites identified within the pelvis. Subtle areas of soft tissue nodularity within the peritoneum is concerning for development of peritoneal metastases. 4. Slight enlargement of the right ovary.  Attention to the right ovary on follow-up imaging to monitor for potential ovarian metastases. Electronically Signed   By: Kerby Moors M.D.   On: 10/11/2016 11:59   Dg Chest Port 1 View  Result Date: 10/25/2016 CLINICAL DATA:  PICC line placement EXAM: PORTABLE CHEST 1 VIEW COMPARISON:  10/11/2016 chest CT FINDINGS: The heart is normal in size. There is aortic atherosclerosis. Diffuse axial and appendicular skeletal metastasis is noted. PICC line tip terminates at the cavoatrial junction from a right-sided approach. New car fell linear pulmonary opacities in the right upper lobe cannot exclude pneumonia. The left lung is clear. There is no effusion. IMPRESSION: PICC line tip in the cavoatrial junction. Patchy airspace opacities in the right upper lobe are new and may reflect pneumonia. Diffuse osteoblastic skeletal metastatic disease. Electronically Signed   By: Ashley Royalty M.D.   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 Continue current steroids. Continue RT.   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 HOLD port placement for now. Elevated LFTs- likley secondary to progression of disease.   # Pain control- continue IV morphine every 4-6 hours.  # discussed re: code status; no decisions made yet.  Also recommend palliative care consult re: goals of care.   # Poor nutrition- family interested in nutrition. Plan TPN;  # CXR- possible pneumonia- recommend Levaquin.   # The overall poor prognosis was discussed with the patient and her friend/and also  her husband. Discussed with Dr.Konidena. Patient will need placement/ skilled facility at discharge.   # 35 minutes face-to-face with the patient discussing the above plan of care; more than 50% of time spent on prognosis/ natural history; counseling and coordination.    Cammie Sickle, MD 10/26/2016 8:41 AM

## 2016-10-26 NOTE — Progress Notes (Signed)
Pharmacy Antibiotic Note  Gissell Ladona Rosendale is a 60 y.o. female admitted on 10/21/2016 with intractable nausea and vomiting secondary to metastatic breast cancer.  Pharmacy has been consulted for Zosyn dosing.  Plan: Will start Zosyn 3.375 g IVq8 hours.    Height: 5\' 1"  (154.9 cm) Weight: 116 lb 11.2 oz (52.9 kg) IBW/kg (Calculated) : 47.8  Temp (24hrs), Avg:98.1 F (36.7 C), Min:97.4 F (36.3 C), Max:98.7 F (37.1 C)   Recent Labs Lab 10/21/16 1458 10/22/16 0335 10/22/16 1936 10/23/16 0457 10/26/16 0356  WBC 14.3*  --  19.2*  --  14.3*  CREATININE 0.48 0.40*  --  0.40* 0.37*    Estimated Creatinine Clearance: 57.1 mL/min (by C-G formula based on SCr of 0.37 mg/dL (L)).    Allergies  Allergen Reactions  . Beef Extract Anaphylaxis  . Pork (Porcine) Protein Anaphylaxis  . Beef-Derived Products Hives    Antimicrobials this admission:   >>   >>  Dose adjustments this admission:   Microbiology results:  BCx:  UCx:   Sputum:    MRSA PCR:  Thank you for allowing pharmacy to be a part of this patient's care.  Toneisha Savary D 10/26/2016 10:04 AM

## 2016-10-26 NOTE — Progress Notes (Signed)
Nutrition/pharmacy feels pt not a candidate for TPN; recommend NG tube. Spoke to pt/family agreed to NG tube/ dobb hoff; discussed pro & cons; spoke to radiology- plan to have it placed today/ start tube feeds. Spoke to [employer].

## 2016-10-26 NOTE — Progress Notes (Signed)
SLP Cancellation Note  Patient Details Name: Elizabeth Mccoy MRN: [id number] DOB: [birth year]   Cancelled treatment:       Reason Eval/Treat Not Completed: Patient declined, no reason specified;Patient at procedure or test/unavailable (reviewed chart notes; consulted MD, NSG, Family present).  Met w/ pt who has been gagging/choking intermittently when attempting oral intake - unsure if related to regurgitation and/or fear of regurgitation. When asked if pt would try a few po trials of water or a thickened liquid, pt declined. Daughter stated pt has been taking ice chips and would be interested in a [employer]. Recommended both of these things for the pleasure of oral stimulation and hygiene as well following general aspiration precautions. However, if pt is not wanting oral intake, this may be related to other issues not oropharyngeal swallowing issues. ST services will f/u on Monday w/ an objective swallow assessment(MBSS) if an appropriate consideration for poc. Recommend frequent oral care at this time d/t reduced oral intake. Recommend consideration of alternative means of feeding for nutrition and medications.     [name], MS, CCC-SLP Elizabeth Mccoy 10/26/2016, 10:07 AM

## 2016-10-26 NOTE — Progress Notes (Signed)
Palliative Medicine Consult Order Noted. Due to limited staffing, there will be a delay seeing this patient. Palliative Medicine Provider will return to [facility] on Monday 10/29/16, and patient will be evaluated then. Please call the Palliative Medicine Team office at [phone] if recommendations are needed in the interim.  Thank you for inviting [place] to see this patient.  Marjie Skiff Danise Dehne, RN, BSN, [facility] 10/26/2016 9:47 AM Cell [phone] 8:00-4:00 Monday-Friday Office [phone]

## 2016-10-26 NOTE — Progress Notes (Signed)
[facility] [place] NAME: Elizabeth Mccoy    MR#:  [id number]  DATE OF BIRTH:  28-Jun-1956  SUBJECTIVE:seen at bedside.having dysphagia difficulty swallowing. c/oback pain. S/p foley yesterday.family noticed immediate improvement in her mental status after foley,she voided a gallon of urine after foley?  CHIEF COMPLAINT:   Chief Complaint  Patient presents with  . Nausea  . Dizziness    REVIEW OF SYSTEMS:    Review of Systems  Constitutional: Positive for malaise/fatigue. Negative for chills and fever.  HENT: Negative for hearing loss and sore throat.   Eyes: Negative for blurred vision, double vision, photophobia and pain.  Respiratory: Negative for cough, hemoptysis, shortness of breath and wheezing.   Cardiovascular: Negative for chest pain, palpitations, orthopnea and leg swelling.  Gastrointestinal: Positive for nausea. Negative for abdominal pain, constipation, diarrhea, heartburn and vomiting.  Genitourinary: Negative for dysuria, hematuria and urgency.  Musculoskeletal: Negative for back pain, joint pain, myalgias and neck pain.  Skin: Negative for rash.  Neurological: Positive for dizziness, focal weakness and weakness. Negative for sensory change, speech change, seizures and headaches.  Endo/Heme/Allergies: Does not bruise/bleed easily.  Psychiatric/Behavioral: Negative for depression and memory loss. The patient is not nervous/anxious and does not have insomnia.     DRUG ALLERGIES:   Allergies  Allergen Reactions  . Beef Extract Anaphylaxis  . Pork (Porcine) Protein Anaphylaxis  . Beef-Derived Products Hives    VITALS:  Blood pressure 134/71, pulse 74, temperature 98.3 F (36.8 C), temperature source Oral, resp. rate 17, height 5\' 1"  (1.549 m), weight 52.9 kg (116 lb 11.2 oz), SpO2 96 %.  PHYSICAL EXAMINATION:   Physical Exam  GENERAL:  60 y.o.-year-old patient lying in the bed with no acute distress. Appears  cachectic. EYES: Pupils equal, round, reactive to light and accommodation. No scleral icterus. Extraocular muscles intact.  HEENT: Head atraumatic, normocephalic. Oropharynx and nasopharynx clear.  NECK:  Supple, no jugular venous distention. No thyroid enlargement, no tenderness.  LUNGS: Normal breath sounds bilaterally, no wheezing, rales, rhonchi. No use of accessory muscles of respiration.  CARDIOVASCULAR: S1, S2 normal. No murmurs, rubs, or gallops.  ABDOMEN: Soft, nontender, nondistended. Bowel sounds present. No organomegaly or mass.  EXTREMITIES: No cyanosis, clubbing or edema b/l.    NEUROLOGIC: Patient is weak on right upper and lower extremities. PSYCHIATRIC: The patient is alert and oriented, SKIN: No obvious rash, lesion, or ulcer.   LABORATORY PANEL:   CBC  Recent Labs Lab 10/26/16 0356  WBC 14.3*  HGB 12.0  HCT 35.6  PLT 150   ------------------------------------------------------------------------------------------------------------------ Chemistries   Recent Labs Lab 10/26/16 0356  NA 130*  K 3.9  CL 101  CO2 21*  GLUCOSE 133*  BUN 13  CREATININE 0.37*  CALCIUM 6.9*  MG 2.4  AST 85*  ALT 217*  ALKPHOS 164*  BILITOT 2.7*   ------------------------------------------------------------------------------------------------------------------  Cardiac Enzymes No results for input(s): TROPONINI in the last 168 hours. ------------------------------------------------------------------------------------------------------------------  RADIOLOGY:  Dg Chest Port 1 View  Result Date: 10/25/2016 CLINICAL DATA:  PICC line placement EXAM: PORTABLE CHEST 1 VIEW COMPARISON:  10/11/2016 chest CT FINDINGS: The heart is normal in size. There is aortic atherosclerosis. Diffuse axial and appendicular skeletal metastasis is noted. PICC line tip terminates at the cavoatrial junction from a right-sided approach. New car fell linear pulmonary opacities in the right upper lobe  cannot exclude pneumonia. The left lung is clear. There is no effusion. IMPRESSION: PICC line tip in the cavoatrial junction.  Patchy airspace opacities in the right upper lobe are new and may reflect pneumonia. Diffuse osteoblastic skeletal metastatic disease. Electronically Signed   By: Ashley Royalty M.D.   On: 10/25/2016 22:17     ASSESSMENT AND PLAN:   * Nausea, vomiting secondary to metastatic breast cancer: Continue fluids, PPIs, nausea medicines Dysphagia: Poor by mouth intake. Started on IV fluids, pharmacy consulted for TPN, speech therapy consult regarding dysphagia,  patient has aspiration pneumonia on the x-ray ,started on Zosyn. With Dr. Rogue Bussing  prognosis is really poor,   * Elevated lipase No abdominal tenderness. Lipase has returned to normal. Possible pancreatitis but CT scan normal. Monitor.  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 Would not recommend PEG tube placement because of poor prognosis and advanced disease, Dr. Rogue Bussing spoke with family regarding CODE STATUS. For now full code,  * Hypovolemic hyponatremia;poor po intake;continue iv fluids. Poor prognosis,  All the records are reviewed and case discussed with Care Management/Social Workerr. Management plans discussed with the patient, family and they are in agreement.  CODE STATUS: FULL CODE  DVT Prophylaxis: SCDs  TOTAL TIME TAKING CARE OF THIS PATIENT: 35 minutes.   POSSIBLE D/C IN 1-2 DAYS, DEPENDING ON CLINICAL CONDITION.  Epifanio Lesches M.D on 10/26/2016 at 9:24 AM  Between 7am to 6pm - Pager - [phone]  After 6pm go to [email or web address] - password [email or web address] [facility]  Office  [phone]  CC: Primary care physician; Gerilyn Pilgrim, FNP  Note: This dictation was prepared with Dragon dictation along with  smaller phrase technology. Any transcriptional errors that result from this process are unintentional.

## 2016-10-26 NOTE — Progress Notes (Signed)
[place] for electrolyte management  Indication: hypophosphatemia   Pharmacy consulted for electrolyte management for 60 yo female severe malnutrition in setting of  Metastatic breast cancer. Patient does not meet recommended requirements for receiving TPN and is having a NG tube placed this evening.   Plan:   Patient to be started on Jevity 1.2 at 88mL/hr after NG tube placement. Patient with low serum phosphorus and at risk for refeeding syndrome. Will replace with potassium phosphate 77mmol IV x 1. Will recheck all electrolytes with am labs.   Allergies  Allergen Reactions  . Beef Extract Anaphylaxis  . Pork (Porcine) Protein Anaphylaxis  . Beef-Derived Products Hives    Patient Measurements: Height: 5\' 1"  (154.9 cm) Weight: 116 lb 11.2 oz (52.9 kg) IBW/kg (Calculated) : 47.8   Vital Signs: Temp: 98.4 F (36.9 C) (11/17 1546) Temp Source: Oral (11/17 1546) BP: 153/79 (11/17 1546) Pulse Rate: 78 (11/17 1546) Intake/Output from previous day: 11/16 0701 - 11/17 0700 In: -  Out: 1925 [Urine:1925] Intake/Output from this shift: Total I/O In: -  Out: 350 [Urine:350]  Labs:  Recent Labs  10/26/16 0356  WBC 14.3*  HGB 12.0  HCT 35.6  PLT 150  CREATININE 0.37*  MG 2.4  PHOS 1.4*  ALBUMIN 3.5  PROT 6.3*  AST 85*  ALT 217*  ALKPHOS 164*  BILITOT 2.7*  BILIDIR 1.4*  IBILI 1.3*   Estimated Creatinine Clearance: 57.1 mL/min (by C-G formula based on SCr of 0.37 mg/dL (L)).   Pharmacy will continue to monitor and adjust per consult.    Simpson,Michael L 10/26/2016,7:46 PM

## 2016-10-26 NOTE — Progress Notes (Signed)
Nutrition Brief Follow-Up Note   DOCUMENTATION CODES:   Severe malnutrition in context of chronic illness  INTERVENTION:  Continue Ensure Enlive po BID as ordered, each supplement provides 350 kcal and 20 grams of protein. RD offered to switch ONS to something less sweet, but family refusing.  Patient not appropriate for TPN as she does have a functional GI tract and is acutely unable to eat in setting of nausea. As patient is immunocompromised, she is at high risk for infection with TPN.  As patient remains full code and is still receiving radiation, recommend placement of small-bore NG tube for tube feeding as short-term bridge for poor intake. As patient with poor prognosis, Oncology is not recommending PEG tube.  Once NG tube placed and confirmed, recommend initiating Jevity 1.2 @ 20 ml/hr. Advance by 20 ml/hr Q8hrs to goal of Jevity 1.2 @ 60 ml/hr. Provides 1728 kcal, 80 grams protein, 1166 ml H2O daily. Can provide an additional 300 ml H2O for free water flush (75 ml Q6hrs).   ASSESSMENT:  Palliative consult in but per short note, will be unable to discuss goals of care with patient and family until Monday. Patient with poor prognosis in setting of advanced disease with metastasis. Per Spiritual Care note this morning, patient expressed to Chaplain "It's over." Family resistant for palliative care.  Spoke with Dr. Rogue Bussing and Dr. Vianne Bulls about how patient is not an appropriate candidate for TPN. Also discussed recommendations for placement of NG tube if short-term nutrition support desired.  SLP attempted to see patient this morning, but patient declined. She had been gagging/choking intermittently with oral intake. Per note, may be regurgitation and/or fear of regurgitation. Plan for MBSS on Monday if appropriate.  Estimated Nutritional Needs:  Kcal:  1585-1850 (30-35 kcal/kg) Protein:  80-90 grams (1.5-1.7 grams/kg) Fluid:  >/= 1.5 L/day  Willey Blade, MS, RD, LDN Pager:  [phone] After Hours Pager: [phone]

## 2016-10-26 NOTE — Progress Notes (Signed)
While rounding, Elizabeth Mccoy made follow up visit. Nurse indicated that there was some discussion about palliative care, but that the family was resistant. Pt presented exhausted and rarely opened her eyes. Pt expressed to me "It's over". Pt wants to fight but feels it is useless. Daughter came bedside a few minutes after my arrival. I sat in the moment for several minutes holding Pt hand. Pt was transported to radiation. [place] will follow up later in my rounding today.    10/26/16 1000  Clinical Encounter Type  Visited With Patient;Patient and family together;Health care provider  Visit Type Follow-up;Spiritual support  Referral From Nurse  Spiritual Encounters  Spiritual Needs Prayer;Emotional

## 2016-10-26 NOTE — Plan of Care (Signed)
Pt had radiation at cancer center this morning.  Pt c/o pain, PRN meds given with improvement.  Per pharmacy, pt doesn't meet requirements for TPN.  NG tube placed for feedings, awaiting confirmation of placement to start feedings.  Pt appears to not be able to really see.  When speaking to pt she doesn't looking directly at speakers face as she cannot see it, per pt's husband she has told him she can see light and shadows.  When daughter tried to show her flowers, she stated that she could not see them.  Pt given a drink of water today and immediately stated choking and aspirated.  Not able to swallow pills at all today.  Clarise Cruz, RN

## 2016-10-27 DIAGNOSIS — Z66 Do not resuscitate: Secondary | ICD-10-CM

## 2016-10-27 LAB — POCT CBG MONITORING
POCT GLUCOSE (MANUAL ENTRY) KUC: 151 mg/dL — AB (ref 70–99)
POCT Glucose (KUC): 131 mg/dL — AB (ref 70–99)
POCT Glucose (KUC): 211 mg/dL — AB (ref 70–99)

## 2016-10-27 LAB — BASIC METABOLIC PANEL
Anion gap: 7 (ref 5–15)
BUN: 11 mg/dL (ref 6–20)
CHLORIDE: 100 mmol/L — AB (ref 101–111)
CO2: 21 mmol/L — ABNORMAL LOW (ref 22–32)
CREATININE: 0.31 mg/dL — AB (ref 0.44–1.00)
Calcium: 6.1 mg/dL — CL (ref 8.9–10.3)
GFR calc Af Amer: >60 mL/min (ref >60)
GFR calc non Af Amer: >60 mL/min (ref >60)
GLUCOSE: 127 mg/dL — AB (ref 65–99)
Potassium: 4.2 mmol/L (ref 3.5–5.1)
SODIUM: 128 mmol/L — AB (ref 135–145)

## 2016-10-27 LAB — MAGNESIUM: MAGNESIUM: 2.2 mg/dL (ref 1.7–2.4)

## 2016-10-27 LAB — PHOSPHORUS: Phosphorus: 1.6 mg/dL — ABNORMAL LOW (ref 2.5–4.6)

## 2016-10-27 MED ORDER — K PHOS MONO-SOD PHOS DI & MONO 155-852-130 MG PO TABS
500.0000 mg | ORAL_TABLET | ORAL | Status: DC
  Filled 2016-10-27 (×2): qty 2

## 2016-10-27 MED ORDER — SODIUM CHLORIDE 0.9 % IV SOLN
1.0000 g | Freq: Once | INTRAVENOUS | Status: AC
  Administered 2016-10-27: 1 g via INTRAVENOUS
  Filled 2016-10-27: qty 10

## 2016-10-27 MED ORDER — OSMOLITE 1.5 CAL PO LIQD
210.0000 mL | Freq: Four times a day (QID) | ORAL | Status: DC
  Administered 2016-10-27: 210 mL

## 2016-10-27 MED ORDER — SODIUM PHOSPHATES 45 MMOLE/15ML IV SOLN
30.0000 mmol | Freq: Once | INTRAVENOUS | Status: AC
  Administered 2016-10-27: 30 mmol via INTRAVENOUS
  Filled 2016-10-27: qty 10

## 2016-10-27 MED ORDER — SODIUM PHOSPHATES 45 MMOLE/15ML IV SOLN
30.0000 mmol | Freq: Once | INTRAVENOUS | Status: DC
  Filled 2016-10-27: qty 10

## 2016-10-27 MED ORDER — PRO-STAT SUGAR FREE PO LIQD: 30.0000 mL | Freq: Three times a day (TID) | ORAL | Status: DC

## 2016-10-27 MED ORDER — ADULT MULTIVITAMIN LIQUID CH
15.0000 mL | Freq: Every day | ORAL | Status: DC
  Filled 2016-10-27 (×5): qty 15

## 2016-10-27 NOTE — Progress Notes (Signed)
[place] for electrolyte management  Indication: hypophosphatemia   Pharmacy consulted for electrolyte management for 60 yo female severe malnutrition in setting of  Metastatic breast cancer. Patient does not meet recommended requirements for receiving TPN and is having a NG tube placed   Plan:   Patient started on Jevity 1.2 at 63mL/hr after NG tube placement. Patient with low serum phosphorus and at risk for refeeding syndrome. Will replace with sodium phosphate 39mmol IV x 1. Will recheck all electrolytes with am labs.   Allergies  Allergen Reactions  . Beef Extract Anaphylaxis  . Pork (Porcine) Protein Anaphylaxis  . Beef-Derived Products Hives    Patient Measurements: Height: 5\' 1"  (154.9 cm) Weight: 128 lb (58.1 kg) IBW/kg (Calculated) : 47.8   Vital Signs: Temp: 97.7 F (36.5 C) (11/18 0451) Temp Source: Oral (11/18 0451) BP: 168/70 (11/18 0451) Pulse Rate: 71 (11/18 0451) Intake/Output from previous day: 11/17 0701 - 11/18 0700 In: 1641.5 [I.V.:787.5; NG/GT:60; IV Piggyback:794] Out: [id number] [Urine:1750] Intake/Output from this shift: Total I/O In: 2710 [I.V.:2710] Out: -   Labs:  Recent Labs  10/26/16 0356 10/27/16 0541  WBC 14.3*  --   HGB 12.0  --   HCT 35.6  --   PLT 150  --   CREATININE 0.37* 0.31*  MG 2.4 2.2  PHOS 1.4* 1.6*  ALBUMIN 3.5  --   PROT 6.3*  --   AST 85*  --   ALT 217*  --   ALKPHOS 164*  --   BILITOT 2.7*  --   BILIDIR 1.4*  --   IBILI 1.3*  --    Estimated Creatinine Clearance: 62 mL/min (by C-G formula based on SCr of 0.31 mg/dL (L)).   Pharmacy will continue to monitor and adjust per consult.    Evadna Donaghy D 10/27/2016,9:15 AM

## 2016-10-27 NOTE — Progress Notes (Signed)
Paged and spoke to Dr. Jannifer Franklin about pt not able to tolerate the feeding tube and report of nausea and headache. Currently, order to hold the feed and reassess nausea in 30 minutes since pt was just repositioned. Will continue to monitor.

## 2016-10-27 NOTE — Progress Notes (Signed)
[facility] NOTE  Patient Care Team: Gerilyn Pilgrim, FNP as PCP - General (Nurse Practitioner)  CHIEF COMPLAINTS/PURPOSE OF CONSULTATION:  Metastatic breast cancer  CC: Patient alert. NG tube feedings discontinued secondary to patient with nausea as well as pain in her neck secondary to the angle of the bad. She otherwise offers no complaints.    MEDICAL HISTORY:  Past Medical History:  Diagnosis Date  . Breast cancer, right ([place]) 2015   rad tx's on right hip.  . Menopause 12/10/2006   2008    SURGICAL HISTORY: Past Surgical History:  Procedure Laterality Date  . CESAREAN SECTION      SOCIAL HISTORY: Social History   Social History  . Marital status: Married    Spouse name: N/A  . Number of children: 2  . Years of education: N/A   Occupational History  . Not on file.   Social History Main Topics  . Smoking status: Never Smoker  . Smokeless tobacco: Never Used  . Alcohol use No  . Drug use: No  . Sexual activity: Not on file   Other Topics Concern  . Not on file   Social History Narrative  . No narrative on file    FAMILY HISTORY: Family History  Problem Relation Age of Onset  . Cancer Father     prostate ca  . Cancer Sister     cancer unknown primary    ALLERGIES:  is allergic to beef extract; pork (porcine) protein; and beef-derived products.  MEDICATIONS:  Current Facility-Administered Medications  Medication Dose Route Frequency Provider Last Rate Last Dose  . 0.9 %  sodium chloride infusion   Intravenous Continuous Epifanio Lesches, MD 50 mL/hr at 10/27/16 0851    . acetaminophen (TYLENOL) tablet 650 mg  650 mg Oral Q6H PRN Hillary Bow, MD   650 mg at 10/23/16 2138  . dexamethasone (DECADRON) injection 4 mg  4 mg Intravenous Q6H Baxter Hire, MD   4 mg at 10/27/16 0535  . dronabinol (MARINOL) capsule 5 mg  5 mg Oral BID AC Baxter Hire, MD   5 mg at 10/26/16 [phone]  . famotidine (PEPCID) IVPB 20 mg premix  20 mg  Intravenous Q12H Epifanio Lesches, MD   20 mg at 10/26/16 2244  . feeding supplement (ENSURE ENLIVE) (ENSURE ENLIVE) liquid 237 mL  237 mL Oral BID BM Srikar Sudini, MD      . feeding supplement (OSMOLITE 1.5 CAL) liquid 210 mL  210 mL Per Tube QID Epifanio Lesches, MD      . feeding supplement (PRO-STAT SUGAR FREE 64) liquid 30 mL  30 mL Per Tube TID Epifanio Lesches, MD      . haloperidol lactate (HALDOL) injection 2.5 mg  2.5 mg Intramuscular Once Epifanio Lesches, MD      . morphine 4 MG/ML injection 2 mg  2 mg Intravenous Q2H PRN Srikar Sudini, MD   2 mg at 10/27/16 1215  . multivitamin liquid 15 mL  15 mL Per Tube Daily Epifanio Lesches, MD      . nystatin (MYCOSTATIN) 100000 UNIT/ML suspension 500,000 Units  5 mL Oral QID Cammie Sickle, MD   500,000 Units at 10/26/16 2244  . ondansetron (ZOFRAN) 8 mg in sodium chloride 0.9 % 50 mL IVPB  8 mg Intravenous Q6H Baxter Hire, MD   8 mg at 10/27/16 [id number]  . ondansetron (ZOFRAN) injection 4 mg  4 mg Intravenous Q6H PRN Hillary Bow, MD   4  mg at 10/25/16 0839  . oxyCODONE (Oxy IR/ROXICODONE) immediate release tablet 5 mg  5 mg Oral Q4H PRN Hillary Bow, MD   5 mg at 10/22/16 2117  . piperacillin-tazobactam (ZOSYN) IVPB 3.375 g  3.375 g Intravenous Q8H Epifanio Lesches, MD   3.375 g at 10/27/16 0201  . polyethylene glycol (MIRALAX / GLYCOLAX) packet 17 g  17 g Oral Daily Epifanio Lesches, MD      . promethazine (PHENERGAN) injection 25 mg  25 mg Intravenous Q6H PRN Epifanio Lesches, MD   25 mg at 10/25/16 1308  . senna-docusate (Senokot-S) tablet 1 tablet  1 tablet Oral BID Epifanio Lesches, MD   1 tablet at 10/26/16 0842  . sodium phosphate 30 mmol in dextrose 5 % 250 mL infusion  30 mmol Intravenous Once Epifanio Lesches, MD   30 mmol at 10/27/16 1100      .  PHYSICAL EXAMINATION:  Vitals:   10/26/16 2038 10/27/16 0451  BP: 133/62 (!) 168/70  Pulse: 78 71  Resp: 20 20  Temp: 98 F (36.7 C) 97.7  F (36.5 C)   Filed Weights   10/21/16 1455 10/21/16 1930 10/27/16 0457  Weight: 123 lb (55.8 kg) 116 lb 11.2 oz (52.9 kg) 128 lb (58.1 kg)    GENERAL: Moderate built moderately nourished female patient resting in the bed..   Accompanied by her father/friend. More lucid today.  EYES: no pallor or icterus OROPHARYNX: no thrush or ulceration. NECK: supple, no masses felt LYMPH:  no palpable lymphadenopathy in the cervical, axillary or inguinal regions LUNGS: decreased breath sounds to auscultation at bases and  No wheeze or crackles HEART/CVS: regular rate & rhythm and no murmurs; No lower extremity edema ABDOMEN: abdomen soft, non-tender and normal bowel sounds Musculoskeletal:no cyanosis of digits and no clubbing  PSYCH: Sleepy; arousable easily. More lucid today.  NEURO:  Sleepy; but oriented x3;  werakness 4/5 left UE; 3/5 weakness in Right LE SKIN:  no rashes or significant lesions  LABORATORY DATA:  I have reviewed the data as listed Lab Results  Component Value Date   WBC 14.3 (H) 10/26/2016   HGB 12.0 10/26/2016   HCT 35.6 10/26/2016   MCV 95.2 10/26/2016   PLT 150 10/26/2016    Recent Labs  10/21/16 1458  10/23/16 0457 10/26/16 0356 10/27/16 0541  NA 127*  < > 133* 130* 128*  K 4.4  < > 4.4 3.9 4.2  CL 97*  < > 104 101 100*  CO2 19*  < > 21* 21* 21*  GLUCOSE 117*  < > 135* 133* 127*  BUN 17  < > 12 13 11   CREATININE 0.48  < > 0.40* 0.37* 0.31*  CALCIUM 7.8*  < > 7.4* 6.9* 6.1*  GFRNONAA >60  < > >60 >60 >60  GFRAA >60  < > >60 >60 >60  PROT 7.1  --  6.3* 6.3*  --   ALBUMIN 4.2  --  3.7 3.5  --   AST 54*  --  55* 85*  --   ALT 64*  --  81* 217*  --   ALKPHOS 229*  --  164* 164*  --   BILITOT 1.0  --  0.8 2.7*  --   BILIDIR  --   --   --  1.4*  --   IBILI  --   --   --  1.3*  --   < > = values in this interval not displayed.  RADIOGRAPHIC STUDIES: I have personally  reviewed the radiological images as listed and agreed with the findings in the  report. Ct Head Wo Contrast  Result Date: 10/21/2016 CLINICAL DATA:  Metastatic breast cancer. EXAM: CT HEAD WITHOUT CONTRAST TECHNIQUE: Contiguous axial images were obtained from the base of the skull through the vertex without intravenous contrast. COMPARISON:  MRI head 10/12/2016 FINDINGS: Brain: Progressive ventricular dilatation since the prior MRI. This is most consistent with hydrocephalus in this patient with metastatic disease and may be due to leptomeningeal tumor. No obstructing mass lesion. Multiple small metastatic deposits are seen throughout the brain on the MRI. These are not well seen on unenhanced CT however there is mild edema in the right occipital lobe and in the right frontal lobe over the convexity consistent with metastatic disease. Negative for hemorrhage. Vascular: Negative for dense MCA Skull: Patchy sclerotic disease in the clivus compatible with metastatic disease. No destructive skull lesion. Sinuses/Orbits: Infiltration of the orbit with enlargement of the extra-ocular muscles most consistent with metastatic disease to the orbit as identified on MRI. Other: None IMPRESSION: Progressive ventricular dilatation since the recent MRI compatible with hydrocephalus. 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 Multiple brain metastasis better seen on MRI. No large obstructing mass lesion is identified. Negative for hemorrhage Metastatic disease to the clivus. Metastatic disease to the orbit bilaterally. Electronically Signed   By: Franchot Gallo M.D.   On: 10/21/2016 17:22   Ct Chest W Contrast  Result Date: 10/11/2016 CLINICAL DATA:  Metastatic breast cancer. EXAM: CT CHEST, ABDOMEN, AND PELVIS WITH CONTRAST TECHNIQUE: Multidetector CT imaging of the chest, abdomen and pelvis was performed following the standard protocol during bolus administration of intravenous contrast. CONTRAST:  111mL ISOVUE-300 IOPAMIDOL (ISOVUE-300) INJECTION 61% COMPARISON:   08/29/2016 FINDINGS: CT CHEST FINDINGS Cardiovascular: The heart size appears normal. Aortic atherosclerosis noted. Mediastinum/Nodes: The trachea appears patent and is midline. Normal appearance of the esophagus. Right axillary node measures 9 mm, image number 18 of series 2. Previously this measured the same. Interval decrease in soft tissue stranding within the right axilla which may be related to prior instrumentation. No enlarged mediastinal or hilar nodes. Lungs/Pleura: Small right pleural effusion. Mild diffuse bronchial wall thickening noted. Left upper lobe pulmonary nodule is unchanged measuring 4 mm, image 56 of series 4. Perifissural nodule in the left lower lobe is stable measuring 4 mm, image number 80 of series 4. No new or enlarging pulmonary nodules identified Musculoskeletal: Extensive, widespread sclerotic bone metastases again noted. Subjectively increased to previous exam. CT ABDOMEN PELVIS FINDINGS Hepatobiliary: Multifocal liver metastases again identified. Index lesion within the right lobe measures 2.6 cm, image 41 of series 2. Previously this measured the same. Lateral segment of left lobe of liver lesion measures 1.5 cm, image 49 of series 2. Previously 2.1 cm. The index lesion within the lateral right lobe of liver measures 2 cm, image 51 of series 2. Previously 2.3 cm. Index lesion within the inferior right lobe measures 2.4 cm, image 59 of series 2. Previously 2.6 cm. Gallbladder appears normal. No biliary dilatation. Pancreas: Normal appearance of the pancreas. Spleen: No change in small low density structure within the spleen measuring 6 mm, image 51 of series 2. Adrenals/Urinary Tract: The adrenal glands are normal. There is persistent bilateral pelvocaliectasis. No hydroureter. Enhancing nodule along the anterior wall of the bladder is again noted, image 81 89 of series 6. Stomach/Bowel: The stomach is normal. The small bowel loops have a normal caliber. No bowel obstruction.  No  pathologic dilatation of the colon. Increase scratch set there is mild thickening of the appendix which measures 8 mm in diameter, image 77 of series 2. No free fluid or fluid collections noted within in the right lower quadrant. Vascular/Lymphatic: Normal appearance of the abdominal aorta. No enlarged upper abdominal lymph nodes. There is no pelvic or inguinal adenopathy. Reproductive: The uterus appears unremarkable. The right ovary measures 2.8 x 2.0 cm, image 89 of series 2. Previously 2.8 x 1.5 cm. Other: There is a small amount of free fluid identified within the pelvis. This is new from previous exam. Additionally, there is a suggestion subtle soft tissue nodularity within the peritoneal fat. For example, image number 64 of series 2 Musculoskeletal: Diffuse all osseous sclerosis compatible secondary to metastatic disease is again identified. IMPRESSION: 1. Index liver lesions are slightly decreased in size from previous exam. 2. Widespread sclerotic bone metastasis as noted previously. Subjectively there may be an increase in the sclerosis which could reflect healing of bone metastases or progression of sclerotic bone metastases. 3. New ascites identified within the pelvis. Subtle areas of soft tissue nodularity within the peritoneum is concerning for development of peritoneal metastases. 4. Slight enlargement of the right ovary. Attention to the right ovary on follow-up imaging to monitor for potential ovarian metastases. Electronically Signed   By: Kerby Moors M.D.   On: 10/11/2016 11:59   Mr Jeri Cos [id number] Contrast  Result Date: 10/12/2016 CLINICAL DATA:  60 year old female with metastatic breast cancer. Staging. Subsequent encounter. EXAM: MRI HEAD WITHOUT AND WITH CONTRAST TECHNIQUE: Multiplanar, multiecho pulse sequences of the brain and surrounding structures were obtained without and with intravenous contrast. CONTRAST:  75mL MULTIHANCE GADOBENATE DIMEGLUMINE 529 MG/ML IV SOLN COMPARISON:  PET-CT  11/14/2015. FINDINGS: Brain: Numerous small brain metastases. There are at least 15 small supratentorial metastases ranging from punctate to 10 mm diameter. There is also extensive metastatic disease affecting the cerebellum with widespread small areas of abnormal enhancement, many of which are suspicious for leptomeningeal disease (series 16 images 14 and 15). The largest individual cerebellar lesions are 8-9 mm. Despite the extensive disease there is only occasional mild cerebral and cerebellar edema, and no significant intracranial mass effect. No hemorrhagic metastases. No abnormal ependymoma 1 enhancement. No pachymeningeal thickening or enhancement. No restricted diffusion or evidence of acute infarction. No ventriculomegaly. No acute intracranial hemorrhage identified. Negative pituitary. Negative cervicomedullary junction. No chronic cerebral blood products identified. There are small chronic lacunar infarcts in the left cerebellum. Subtle chronic cortical encephalomalacia in the right parietal lobe (series 9, image 21) compatible with previous small posterior right MCA infarct. Vascular: Major intracranial vascular flow voids are preserved, the distal left vertebral artery appears dominant. Skull and upper cervical spine: Diffuse abnormal bone marrow signal throughout the cervical spine and at the skullbase in keeping with widespread osseous metastatic disease. No destructive calvarial lesion identified. Negative visualized cervical spinal cord. Sinuses/Orbits: Abnormal heterogeneous appearance of the bilateral intraorbital soft tissues including the right lateral rectus muscle which is expanded in T2 hyperintense (series 14, image 20 and series 9, image 9). Similar heterogeneity throughout the left orbital soft tissues. There seems to be a mild degree of enophthalmos. The globes appear remain intact. Trace paranasal sinus mucosal thickening. Other: Mastoids are clear. Visible internal auditory structures  appear normal. Negative scalp soft tissues. IMPRESSION: 1. Widespread metastatic disease to the brain. At least 15 small supratentorial metastases ranging from punctate to 10 mm diameter, and extensive cerebellar metastatic disease which appears  highly suspicious for leptomeningeal metastases. 2. Only mild scattered brain edema and no significant intracranial mass effect. 3. Bilateral orbit soft tissue metastases (scirrhous infiltration of the orbits). 4. Widespread osseous metastatic disease. Electronically Signed   By: Genevie Ann M.D.   On: 10/12/2016 13:18   Mr Thoracic Spine W Wo Contrast  Result Date: 10/23/2016 CLINICAL DATA:  Metastatic breast cancer. EXAM: MRI THORACIC AND LUMBAR SPINE WITHOUT AND WITH CONTRAST TECHNIQUE: Multiplanar and multiecho pulse sequences of the thoracic and lumbar spine were obtained without and with intravenous contrast. CONTRAST:  62mL MULTIHANCE GADOBENATE DIMEGLUMINE 529 MG/ML IV SOLN COMPARISON:  Bone scan 08/29/2016 FINDINGS: MRI THORACIC SPINE FINDINGS Alignment:  Physiologic. Vertebrae: Heterogeneous marrow diffusely from extensive metastatic disease. Every thoracic vertebra is involved. No extraosseous tumor growth is noted in the epidural or foraminal spaces. No pathologic fracture. Cord: The cord has normal signal, but there is diffuse surface enhancement of the cord and intrathecal roots. Paraspinal and other soft tissues: Known hepatic metastatic disease. Trace right pleural effusion. Disc levels: No degenerative impingement MRI LUMBAR SPINE FINDINGS Segmentation:  Standard. Alignment:  Physiologic. Vertebrae: Metastases throughout all lumbar levels. No pathologic fracture or epidural/ foraminal infiltration. Conus medullaris: Extends to the T12 level and appears normal. There is diffuse thickening enhancement of the cauda equina Paraspinal and other soft tissues: The bladder is over distended and there is bilateral moderate hydroureteronephrosis. Disc levels: Lower  lumbar facet arthropathy.  No degenerative impingement. These results will be called to the ordering clinician or representative by the Radiologist Assistant, and communication documented in the PACS or zVision Dashboard. IMPRESSION: 1. 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 2. Widespread osseous metastatic disease without pathologic fracture or detected epidural infiltration. 3. Over distended bladder with bilateral hydronephrosis, correlate for retention related to #1. 4. Known hepatic metastatic disease. Electronically Signed   By: Monte Fantasia M.D.   On: 10/23/2016 13:37   Mr Lumbar Spine W Wo Contrast  Result Date: 10/23/2016 CLINICAL DATA:  Metastatic breast cancer. EXAM: MRI THORACIC AND LUMBAR SPINE WITHOUT AND WITH CONTRAST TECHNIQUE: Multiplanar and multiecho pulse sequences of the thoracic and lumbar spine were obtained without and with intravenous contrast. CONTRAST:  63mL MULTIHANCE GADOBENATE DIMEGLUMINE 529 MG/ML IV SOLN COMPARISON:  Bone scan 08/29/2016 FINDINGS: MRI THORACIC SPINE FINDINGS Alignment:  Physiologic. Vertebrae: Heterogeneous marrow diffusely from extensive metastatic disease. Every thoracic vertebra is involved. No extraosseous tumor growth is noted in the epidural or foraminal spaces. No pathologic fracture. Cord: The cord has normal signal, but there is diffuse surface enhancement of the cord and intrathecal roots. Paraspinal and other soft tissues: Known hepatic metastatic disease. Trace right pleural effusion. Disc levels: No degenerative impingement MRI LUMBAR SPINE FINDINGS Segmentation:  Standard. Alignment:  Physiologic. Vertebrae: Metastases throughout all lumbar levels. No pathologic fracture or epidural/ foraminal infiltration. Conus medullaris: Extends to the T12 level and appears normal. There is diffuse thickening enhancement of the cauda equina Paraspinal and other soft tissues: The  bladder is over distended and there is bilateral moderate hydroureteronephrosis. Disc levels: Lower lumbar facet arthropathy.  No degenerative impingement. These results will be called to the ordering clinician or representative by the Radiologist Assistant, and communication documented in the PACS or zVision Dashboard. IMPRESSION: 1. 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 2. Widespread osseous metastatic disease without pathologic fracture or detected epidural infiltration. 3. Over distended bladder with bilateral hydronephrosis, correlate for  retention related to #1. 4. Known hepatic metastatic disease. Electronically Signed   By: Monte Fantasia M.D.   On: 10/23/2016 13:37   Ct Abdomen Pelvis W Contrast  Result Date: 10/11/2016 CLINICAL DATA:  Metastatic breast cancer. EXAM: CT CHEST, ABDOMEN, AND PELVIS WITH CONTRAST TECHNIQUE: Multidetector CT imaging of the chest, abdomen and pelvis was performed following the standard protocol during bolus administration of intravenous contrast. CONTRAST:  134mL ISOVUE-300 IOPAMIDOL (ISOVUE-300) INJECTION 61% COMPARISON:  08/29/2016 FINDINGS: CT CHEST FINDINGS Cardiovascular: The heart size appears normal. Aortic atherosclerosis noted. Mediastinum/Nodes: The trachea appears patent and is midline. Normal appearance of the esophagus. Right axillary node measures 9 mm, image number 18 of series 2. Previously this measured the same. Interval decrease in soft tissue stranding within the right axilla which may be related to prior instrumentation. No enlarged mediastinal or hilar nodes. Lungs/Pleura: Small right pleural effusion. Mild diffuse bronchial wall thickening noted. Left upper lobe pulmonary nodule is unchanged measuring 4 mm, image 56 of series 4. Perifissural nodule in the left lower lobe is stable measuring 4 mm, image number 80 of series 4. No new or enlarging pulmonary nodules identified  Musculoskeletal: Extensive, widespread sclerotic bone metastases again noted. Subjectively increased to previous exam. CT ABDOMEN PELVIS FINDINGS Hepatobiliary: Multifocal liver metastases again identified. Index lesion within the right lobe measures 2.6 cm, image 41 of series 2. Previously this measured the same. Lateral segment of left lobe of liver lesion measures 1.5 cm, image 49 of series 2. Previously 2.1 cm. The index lesion within the lateral right lobe of liver measures 2 cm, image 51 of series 2. Previously 2.3 cm. Index lesion within the inferior right lobe measures 2.4 cm, image 59 of series 2. Previously 2.6 cm. Gallbladder appears normal. No biliary dilatation. Pancreas: Normal appearance of the pancreas. Spleen: No change in small low density structure within the spleen measuring 6 mm, image 51 of series 2. Adrenals/Urinary Tract: The adrenal glands are normal. There is persistent bilateral pelvocaliectasis. No hydroureter. Enhancing nodule along the anterior wall of the bladder is again noted, image 81 89 of series 6. Stomach/Bowel: The stomach is normal. The small bowel loops have a normal caliber. No bowel obstruction. No pathologic dilatation of the colon. Increase scratch set there is mild thickening of the appendix which measures 8 mm in diameter, image 77 of series 2. No free fluid or fluid collections noted within in the right lower quadrant. Vascular/Lymphatic: Normal appearance of the abdominal aorta. No enlarged upper abdominal lymph nodes. There is no pelvic or inguinal adenopathy. Reproductive: The uterus appears unremarkable. The right ovary measures 2.8 x 2.0 cm, image 89 of series 2. Previously 2.8 x 1.5 cm. Other: There is a small amount of free fluid identified within the pelvis. This is new from previous exam. Additionally, there is a suggestion subtle soft tissue nodularity within the peritoneal fat. For example, image number 64 of series 2 Musculoskeletal: Diffuse all osseous  sclerosis compatible secondary to metastatic disease is again identified. IMPRESSION: 1. Index liver lesions are slightly decreased in size from previous exam. 2. Widespread sclerotic bone metastasis as noted previously. Subjectively there may be an increase in the sclerosis which could reflect healing of bone metastases or progression of sclerotic bone metastases. 3. New ascites identified within the pelvis. Subtle areas of soft tissue nodularity within the peritoneum is concerning for development of peritoneal metastases. 4. Slight enlargement of the right ovary. Attention to the right ovary on follow-up imaging to monitor for potential  ovarian metastases. Electronically Signed   By: Kerby Moors M.D.   On: 10/11/2016 11:59   Dg Chest Port 1 View  Result Date: 10/25/2016 CLINICAL DATA:  PICC line placement EXAM: PORTABLE CHEST 1 VIEW COMPARISON:  10/11/2016 chest CT FINDINGS: The heart is normal in size. There is aortic atherosclerosis. Diffuse axial and appendicular skeletal metastasis is noted. PICC line tip terminates at the cavoatrial junction from a right-sided approach. New car fell linear pulmonary opacities in the right upper lobe cannot exclude pneumonia. The left lung is clear. There is no effusion. IMPRESSION: PICC line tip in the cavoatrial junction. Patchy airspace opacities in the right upper lobe are new and may reflect pneumonia. Diffuse osteoblastic skeletal metastatic disease. Electronically Signed   By: Ashley Royalty M.D.   On: 10/25/2016 22:17   Dg Addison Bailey G Tube Plc W/fl W/rad  Result Date: 10/26/2016 CLINICAL DATA:  NG tube placement EXAM: NASO G TUBE PLACEMENT WITH FL AND WITH RAD FLUOROSCOPY TIME:  Fluoroscopy Time:  6 second Radiation Exposure Index (if provided by the fluoroscopic device): Number of Acquired Spot Images: 0 COMPARISON:  None. FINDINGS: NG tube was placed by the radiologist. Single spot image over the average demonstrates the NG tube tip in the proximal stomach.  IMPRESSION: NG tube tip in the proximal stomach. Electronically Signed   By: Rolm Baptise M.D.   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 Continue current steroids. XRT delayed Friday secondary to agitation. Patient on scheduled to reinitiate on Monday.  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 HOLD port placement for now. Elevated LFTs- likley secondary to progression of disease.   # Pain control- continue IV morphine every 4-6 hours. Patient states her narcotic regimen is adequate at this time.  # CODE STATUS: Patient's husband has changed her CODE STATUS to DO NOT RESUSCITATE. Continue supportive care. Palliative care consult pending.   # Poor nutrition- family interested in nutrition. TPN no longer an option. NG tube placed. Patient could not tolerate continuous feeds, therefore will try intermittent feeds.  # CXR- possible pneumonia- continue Levaquin.   # The overall poor prognosis was discussed with the patient and her friend/and also her husband. Discussed with Dr.Konidena. Patient will need placement/ skilled facility at discharge.     Lloyd Huger, MD 10/27/2016 12:29 PM

## 2016-10-27 NOTE — Progress Notes (Signed)
Nutrition Follow-up  DOCUMENTATION CODES:   Severe malnutrition in context of chronic illness  INTERVENTION:   11/18: Osmolite 1.5 100 ml QID with 30 ml Prostat TID = 900 kcal and 70 grams protein 11/19: Osmolite 1.5 150 ml QID with 30 ml Prostat TID = 1200 kcal and 82 grams protein 11/20: Osmolite 1.5 210 ml QID with 30 ml Prostat TID = 1560 kcal,  97 grams protein, and 640 ml H2O.   MVI daily  Can add 250 ml H2O QID to provide 1640 ml H2O.    Paged by bedside RN. NG placed by radiology yesterday 11/17 and Jevity 1.2 started at 20 ml however soon stopped (after 4 hours) due to headache and nausea. Per RN pt mostly did not tolerate HOB > 30 degrees very long. MD would like to try bolus type feedings. As it appears that pt would not tolerate nocturnal feedings will attempt slow progression of small TF bolus as pt's phosphorus was very low at baseline.  Pt does have zofran scheduled. Spoke with RN, recommend provide feeding after dose of zofran delivered.   Noted PO 4: 1.4 (11/17), 1.6 (11/18)  - potassium phosphate dose delivered 11/17, Sodium phosphate dose ordered 11/18  Estimated Nutritional Needs:   Kcal:  1585-1850 (30-35 kcal/kg)  Protein:  80-90 grams (1.5-1.7 grams/kg)  Fluid:  >/= 1.5 L/day  Maylon Peppers RD, LDN, CNSC [phone] Pager [phone] After Hours Pager

## 2016-10-27 NOTE — Progress Notes (Signed)
[facility] [facility] [place] NAME: Elizabeth Mccoy    MR#:  [id number]  DATE OF BIRTH:  [birth year]  SUBJECTIVE: Started on now NG tube feedings last night but patient did not tolerate the tube feeds at rate of 30 mL per hour had nausea, had a headache with angle of 30.   CHIEF COMPLAINT:   Chief Complaint  Patient presents with  . Nausea  . Dizziness    REVIEW OF SYSTEMS:    Review of Systems  Constitutional: Positive for malaise/fatigue. Negative for chills and fever.  HENT: Negative for hearing loss and sore throat.   Eyes: Negative for blurred vision, double vision, photophobia and pain.  Respiratory: Negative for cough, hemoptysis, shortness of breath and wheezing.   Cardiovascular: Negative for chest pain, palpitations, orthopnea and leg swelling.  Gastrointestinal: Positive for nausea. Negative for abdominal pain, constipation, diarrhea, heartburn and vomiting.  Genitourinary: Negative for dysuria, hematuria and urgency.  Musculoskeletal: Negative for back pain, joint pain, myalgias and neck pain.  Skin: Negative for rash.  Neurological: Positive for dizziness, focal weakness and weakness. Negative for sensory change, speech change, seizures and headaches.  Endo/Heme/Allergies: Does not bruise/bleed easily.  Psychiatric/Behavioral: Negative for depression and memory loss. The patient is not nervous/anxious and does not have insomnia.     DRUG ALLERGIES:   Allergies  Allergen Reactions  . Beef Extract Anaphylaxis  . Pork (Porcine) Protein Anaphylaxis  . Beef-Derived Products Hives    VITALS:  Blood pressure (!) 168/70, pulse 71, temperature 97.7 F (36.5 C), temperature source Oral, resp. rate 20, height 5\' 1"  (1.549 m), weight 58.1 kg (128 lb), SpO2 98 %.  PHYSICAL EXAMINATION:   Physical Exam  GENERAL:  60 y.o.-year-old patient lying in the bed with no acute distress. Appears cachectic. EYES: Pupils equal, round, reactive to  light and accommodation. No scleral icterus. Extraocular muscles intact.  HEENT: Head atraumatic, normocephalic. Oropharynx and nasopharynx clear.  NECK:  Supple, no jugular venous distention. No thyroid enlargement, no tenderness.  LUNGS: Normal breath sounds bilaterally, no wheezing, rales, rhonchi. No use of accessory muscles of respiration.  CARDIOVASCULAR: S1, S2 normal. No murmurs, rubs, or gallops.  ABDOMEN: Soft, nontender, nondistended. Bowel sounds present. No organomegaly or mass.  EXTREMITIES: No cyanosis, clubbing or edema b/l.    NEUROLOGIC: Patient is weak on right upper and lower extremities. PSYCHIATRIC: The patient is alert and oriented, SKIN: No obvious rash, lesion, or ulcer.   LABORATORY PANEL:   CBC  Recent Labs Lab 10/26/16 0356  WBC 14.3*  HGB 12.0  HCT 35.6  PLT 150   ------------------------------------------------------------------------------------------------------------------ Chemistries   Recent Labs Lab 10/26/16 0356 10/27/16 0541  NA 130* 128*  K 3.9 4.2  CL 101 100*  CO2 21* 21*  GLUCOSE 133* 127*  BUN 13 11  CREATININE 0.37* 0.31*  CALCIUM 6.9* 6.1*  MG 2.4 2.2  AST 85*  --   ALT 217*  --   ALKPHOS 164*  --   BILITOT 2.7*  --    ------------------------------------------------------------------------------------------------------------------  Cardiac Enzymes No results for input(s): TROPONINI in the last 168 hours. ------------------------------------------------------------------------------------------------------------------  RADIOLOGY:  Dg Chest Port 1 View  Result Date: 10/25/2016 CLINICAL DATA:  PICC line placement EXAM: PORTABLE CHEST 1 VIEW COMPARISON:  10/11/2016 chest CT FINDINGS: The heart is normal in size. There is aortic atherosclerosis. Diffuse axial and appendicular skeletal metastasis is noted. PICC line tip terminates at the cavoatrial junction from a right-sided approach. New car fell  linear pulmonary  opacities in the right upper lobe cannot exclude pneumonia. The left lung is clear. There is no effusion. IMPRESSION: PICC line tip in the cavoatrial junction. Patchy airspace opacities in the right upper lobe are new and may reflect pneumonia. Diffuse osteoblastic skeletal metastatic disease. Electronically Signed   By: Ashley Royalty M.D.   On: 10/25/2016 22:17   Dg Addison Bailey G Tube Plc W/fl W/rad  Result Date: 10/26/2016 CLINICAL DATA:  NG tube placement EXAM: NASO G TUBE PLACEMENT WITH FL AND WITH RAD FLUOROSCOPY TIME:  Fluoroscopy Time:  6 second Radiation Exposure Index (if provided by the fluoroscopic device): Number of Acquired Spot Images: 0 COMPARISON:  None. FINDINGS: NG tube was placed by the radiologist. Single spot image over the average demonstrates the NG tube tip in the proximal stomach. IMPRESSION: NG tube tip in the proximal stomach. Electronically Signed   By: Rolm Baptise M.D.   On: 10/26/2016 19:18     ASSESSMENT AND PLAN:   * Nausea, vomiting secondary to metastatic breast cancer:  Now has aphasia: Started on tube feeding, continue aspiration precautions, try and see if she can take bolus feedings instead of continuous feedings to prevent headaches, nausea,. Spoke with nurse, patient's family.  * Elevated lipase No abdominal tenderness. Lipase has returned to normal. Possible pancreatitis but CT scan normal. Monitor.  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 On IV steroids. Port placement is on hold because of decline in medical condition, patient's prognosis is really poor, family wants aggressive care  Aspiration pneumonia: Started on Zosyn, continue strict aspiration precautions,  ,  Hyponatremia;: Continue IV fluids D/w husband,RN  All the records are reviewed and case discussed with Care Management/Social Workerr. Management plans discussed with the patient, family and they are in  agreement.  CODE STATUS: FULL CODE  DVT Prophylaxis: SCDs  TOTAL TIME TAKING CARE OF THIS PATIENT: 35 minutes.   POSSIBLE D/C IN 1-2 DAYS, DEPENDING ON CLINICAL CONDITION.  Epifanio Lesches M.D on 10/27/2016 at 11:49 AM  Between 7am to 6pm - Pager - [phone]  After 6pm go to [email or web address] - password [email or web address] [facility]  Office  [phone]  CC: Primary care physician; Gerilyn Pilgrim, FNP  Note: This dictation was prepared with Dragon dictation along with smaller phrase technology. Any transcriptional errors that result from this process are unintentional.

## 2016-10-28 LAB — BASIC METABOLIC PANEL
Anion gap: 7 (ref 5–15)
BUN: 11 mg/dL (ref 6–20)
CALCIUM: 6.2 mg/dL — AB (ref 8.9–10.3)
CO2: 21 mmol/L — ABNORMAL LOW (ref 22–32)
CREATININE: 0.36 mg/dL — AB (ref 0.44–1.00)
Chloride: 102 mmol/L (ref 101–111)
GFR calc Af Amer: >60 mL/min (ref >60)
GLUCOSE: 127 mg/dL — AB (ref 65–99)
Potassium: 4.1 mmol/L (ref 3.5–5.1)
Sodium: 130 mmol/L — ABNORMAL LOW (ref 135–145)

## 2016-10-28 LAB — MAGNESIUM: Magnesium: 2.2 mg/dL (ref 1.7–2.4)

## 2016-10-28 LAB — PHOSPHORUS: PHOSPHORUS: 1.5 mg/dL — AB (ref 2.5–4.6)

## 2016-10-28 MED ORDER — SODIUM PHOSPHATES 45 MMOLE/15ML IV SOLN
30.0000 mmol | Freq: Once | INTRAVENOUS | Status: AC
  Administered 2016-10-28: 12:00:00 30 mmol via INTRAVENOUS
  Filled 2016-10-28: qty 10

## 2016-10-28 MED ORDER — SODIUM CHLORIDE 0.9 % IV SOLN
1.0000 g | Freq: Once | INTRAVENOUS | Status: AC
  Administered 2016-10-28: 1 g via INTRAVENOUS
  Filled 2016-10-28: qty 10

## 2016-10-28 NOTE — Progress Notes (Signed)
[name]    MR#:  [id number]  DATE OF BIRTH:  [birth year]  SUBJECTIVE: NG tube came out last night. Denies complaints. Husband is requesting comfort care, discussed with him at length her prognosis is poor.  CHIEF COMPLAINT:   Chief Complaint  Patient presents with  . Nausea  . Dizziness    REVIEW OF SYSTEMS:    Review of Systems  Constitutional: Positive for malaise/fatigue. Negative for chills and fever.  HENT: Negative for hearing loss and sore throat.   Eyes: Negative for blurred vision, double vision, photophobia and pain.  Respiratory: Negative for cough, hemoptysis, shortness of breath and wheezing.   Cardiovascular: Negative for chest pain, palpitations, orthopnea and leg swelling.  Gastrointestinal: Positive for nausea. Negative for abdominal pain, constipation, diarrhea, heartburn and vomiting.  Genitourinary: Negative for dysuria, hematuria and urgency.  Musculoskeletal: Negative for back pain, joint pain, myalgias and neck pain.  Skin: Negative for rash.  Neurological: Positive for dizziness, focal weakness and weakness. Negative for sensory change, speech change, seizures and headaches.  Endo/Heme/Allergies: Does not bruise/bleed easily.  Psychiatric/Behavioral: Negative for depression and memory loss. The patient is not nervous/anxious and does not have insomnia.     DRUG ALLERGIES:   Allergies  Allergen Reactions  . Beef Extract Anaphylaxis  . Pork (Porcine) Protein Anaphylaxis  . Beef-Derived Products Hives    VITALS:  Blood pressure 138/78, pulse 83, temperature 98.5 F (36.9 C), temperature source Oral, resp. rate 20, height 5\' 1"  (1.549 m), weight 54.9 kg (121 lb), SpO2 98 %.  PHYSICAL EXAMINATION:   Physical Exam  GENERAL:  60 y.o.-year-old patient lying in the bed with no acute distress. Appears cachectic. EYES: Pupils equal, round, reactive to light and accommodation. No  scleral icterus. Extraocular muscles intact.  HEENT: Head atraumatic, normocephalic. Oropharynx and nasopharynx clear.  NECK:  Supple, no jugular venous distention. No thyroid enlargement, no tenderness.  LUNGS: Normal breath sounds bilaterally, no wheezing, rales, rhonchi. No use of accessory muscles of respiration.  CARDIOVASCULAR: S1, S2 normal. No murmurs, rubs, or gallops.  ABDOMEN: Soft, nontender, nondistended. Bowel sounds present. No organomegaly or mass.  EXTREMITIES: No cyanosis, clubbing or edema b/l.    NEUROLOGIC: Patient is weak on right upper and lower extremities. PSYCHIATRIC: The patient is alert and oriented, SKIN: No obvious rash, lesion, or ulcer.   LABORATORY PANEL:   CBC  Recent Labs Lab 10/26/16 0356  WBC 14.3*  HGB 12.0  HCT 35.6  PLT 150   ------------------------------------------------------------------------------------------------------------------ Chemistries   Recent Labs Lab 10/26/16 0356  10/28/16 0548  NA 130*  < > 130*  K 3.9  < > 4.1  CL 101  < > 102  CO2 21*  < > 21*  GLUCOSE 133*  < > 127*  BUN 13  < > 11  CREATININE 0.37*  < > 0.36*  CALCIUM 6.9*  < > 6.2*  MG 2.4  < > 2.2  AST 85*  --   --   ALT 217*  --   --   ALKPHOS 164*  --   --   BILITOT 2.7*  --   --   < > = values in this interval not displayed. ------------------------------------------------------------------------------------------------------------------  Cardiac Enzymes No results for input(s): TROPONINI in the last 168 hours. ------------------------------------------------------------------------------------------------------------------  RADIOLOGY:  Dg Naso G Tube Plc W/fl W/rad  Result Date: 10/26/2016 CLINICAL DATA:  NG tube placement EXAM: NASO G TUBE PLACEMENT WITH  FL AND WITH RAD FLUOROSCOPY TIME:  Fluoroscopy Time:  6 second Radiation Exposure Index (if provided by the fluoroscopic device): Number of Acquired Spot Images: 0 COMPARISON:  None. FINDINGS:  NG tube was placed by the radiologist. Single spot image over the average demonstrates the NG tube tip in the proximal stomach. IMPRESSION: NG tube tip in the proximal stomach. Electronically Signed   By: Rolm Baptise M.D.   On: 10/26/2016 19:18     ASSESSMENT AND PLAN:   * Nausea, vomiting secondary to metastatic breast cancer:  Now has aphasia: Started on tube feeding, continue aspiration precautions,Unable to tolerate tube feedings, bolus feedings yesterday, NG tube came out. And patient's husband is the requesting comfort care for her. We can now start comfort care orders for her. Discussed with him at length about the poor prognosis, he wanted [place] to talk to patient's daughter also.  * Elevated lipase No abdominal tenderness. Lipase has returned to normal. Possible pancreatitis but CT scan normal. Monitor.  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 On IV steroids. Port placement is on hold because of decline in medical condition, patient's prognosis is really poor, CODE STATUS changed to DO NOT RESUSCITATE by her husband. Continue supportive care. We will order the full comfort care orders after talking to her daughter also. Aspiration pneumonia: Started on Zosyn, continue strict aspiration precautions,  ,  Hyponatremia;: Continue IV fluids D/w husband,RN  All the records are reviewed and case discussed with Care Management/Social Workerr. Management plans discussed with the patient, family and they are in agreement.  CODE STATUS: FULL CODE  DVT Prophylaxis: SCDs  TOTAL TIME TAKING CARE OF THIS PATIENT: 35 minutes.   POSSIBLE D/C IN 1-2 DAYS, DEPENDING ON CLINICAL CONDITION.  Epifanio Lesches M.D on 10/28/2016 at 10:03 AM  Between 7am to 6pm - Pager - [phone]  After 6pm go to [email or web address] - password [email or web address] [facility]  Office  [phone]  CC: Primary  care physician; Gerilyn Pilgrim, FNP  Note: This dictation was prepared with Dragon dictation along with smaller phrase technology. Any transcriptional errors that result from this process are unintentional.

## 2016-10-28 NOTE — Plan of Care (Signed)
Problem: Education: Goal: Knowledge of [facility] Education information/materials will improve edu to family  When pt  unable  Problem: Pain Managment: Goal: General experience of comfort will improve Outcome: Progressing Morphine x 3 today this shift  For c/o pain/ moaning  Problem: Physical Regulation: Goal: Ability to maintain clinical measurements within normal limits will improve Outcome: Not Progressing Bedrest today  Problem: Skin Integrity: Goal: Risk for impaired skin integrity will decrease Able to turn self in bed. No breakdown  Problem: Activity: Goal: Risk for activity intolerance will decrease See above note  Problem: Fluid Volume: Goal: Ability to maintain a balanced intake and output will improve Outcome: Progressing ivfs cont. Dr Vianne Bulls spoke with spouse today re comfort care spouse in agreement.  Dr b to  Speak with dtr tomorrow

## 2016-10-28 NOTE — Progress Notes (Signed)
[place] for electrolyte management  Indication: hypophosphatemia   Pharmacy consulted for electrolyte management for 60 yo female severe malnutrition in setting of  Metastatic breast cancer. Patient does not meet recommended requirements for receiving TPN and is having a NG tube placed   Plan:   Patient started on Jevity 1.2 at 74mL/hr after NG tube placement. Patient with low serum phosphorus and at risk for refeeding syndrome. Will replace with sodium phosphate 86mmol IV x 1 again. Will recheck all electrolytes with am labs.   Allergies  Allergen Reactions  . Beef Extract Anaphylaxis  . Pork (Porcine) Protein Anaphylaxis  . Beef-Derived Products Hives    Patient Measurements: Height: 5\' 1"  (154.9 cm) Weight: 121 lb (54.9 kg) IBW/kg (Calculated) : 47.8   Vital Signs: Temp: 98 F (36.7 C) (11/19 0421) Temp Source: Oral (11/19 0421) BP: 149/77 (11/19 0421) Pulse Rate: 71 (11/19 0421) Intake/Output from previous day: 11/18 0701 - 11/19 0700 In: 4488.8 [I.V.:3690.8; NG/GT:300; IV Piggyback:498] Out: 1850 [Urine:1850] Intake/Output from this shift: No intake/output data recorded.  Labs:  Recent Labs  10/26/16 0356 10/27/16 0541 10/28/16 0548  WBC 14.3*  --   --   HGB 12.0  --   --   HCT 35.6  --   --   PLT 150  --   --   CREATININE 0.37* 0.31* 0.36*  MG 2.4 2.2 2.2  PHOS 1.4* 1.6* 1.5*  ALBUMIN 3.5  --   --   PROT 6.3*  --   --   AST 85*  --   --   ALT 217*  --   --   ALKPHOS 164*  --   --   BILITOT 2.7*  --   --   BILIDIR 1.4*  --   --   IBILI 1.3*  --   --    Estimated Creatinine Clearance: 56.4 mL/min (by C-G formula based on SCr of 0.36 mg/dL (L)).   Pharmacy will continue to monitor and adjust per consult.    Terance Pomplun D 10/28/2016,8:07 AM

## 2016-10-28 NOTE — Progress Notes (Signed)
Nutrition Brief Note  Chart reviewed. Pt now transitioning to comfort care.  No further nutrition interventions warranted at this time.  Please re-consult as needed.   Laporcha Marchesi M. Kelsea Mousel, MS, RD LDN Inpatient Clinical Dietitian Pager [phone]    

## 2016-10-28 NOTE — Progress Notes (Signed)
Called and reported to Dr. Estanislado Pandy pt critical Ca 6.2.

## 2016-10-28 NOTE — Progress Notes (Signed)
Paged and spoke with Dr. Estanislado Pandy that pt report she cannot see and pt also mistakenly removed NG tube. Per Dr. Estanislado Pandy, hold off on NG tube for now. Will continue to monitor.

## 2016-10-29 ENCOUNTER — Ambulatory Visit
Admission: RE | Admit: 2016-10-29 | Discharge: 2016-10-29 | Disposition: A | Discharge status: Home / Self Care | Payer: BLUE CROSS/BLUE SHIELD | Source: Ambulatory Visit | Attending: Radiation Oncology | Admitting: Radiation Oncology

## 2016-10-29 ENCOUNTER — Ambulatory Visit: Payer: BLUE CROSS/BLUE SHIELD

## 2016-10-29 DIAGNOSIS — E43 Unspecified severe protein-calorie malnutrition: Secondary | ICD-10-CM

## 2016-10-29 DIAGNOSIS — Z7189 Other specified counseling: Secondary | ICD-10-CM

## 2016-10-29 DIAGNOSIS — H547 Unspecified visual loss: Secondary | ICD-10-CM

## 2016-10-29 DIAGNOSIS — C50812 Malignant neoplasm of overlapping sites of left female breast: Secondary | ICD-10-CM | POA: Diagnosis not present

## 2016-10-29 DIAGNOSIS — R918 Other nonspecific abnormal finding of lung field: Secondary | ICD-10-CM

## 2016-10-29 DIAGNOSIS — R7989 Other specified abnormal findings of blood chemistry: Secondary | ICD-10-CM

## 2016-10-29 DIAGNOSIS — Z515 Encounter for palliative care: Secondary | ICD-10-CM

## 2016-10-29 LAB — GLUCOSE, CAPILLARY
GLUCOSE-CAPILLARY: 131 mg/dL — AB (ref 65–99)
GLUCOSE-CAPILLARY: 151 mg/dL — AB (ref 65–99)
GLUCOSE-CAPILLARY: 211 mg/dL — AB (ref 65–99)
Glucose-Capillary: 150 mg/dL — ABNORMAL HIGH (ref 65–99)
Glucose-Capillary: 139 mg/dL — ABNORMAL HIGH (ref 65–99)
Glucose-Capillary: 145 mg/dL — ABNORMAL HIGH (ref 65–99)
Glucose-Capillary: 130 mg/dL — ABNORMAL HIGH (ref 65–99)
Glucose-Capillary: 109 mg/dL — ABNORMAL HIGH (ref 65–99)

## 2016-10-29 LAB — BASIC METABOLIC PANEL
Anion gap: 8 (ref 5–15)
BUN: 12 mg/dL (ref 6–20)
CALCIUM: 6.1 mg/dL — AB (ref 8.9–10.3)
CO2: 20 mmol/L — ABNORMAL LOW (ref 22–32)
Chloride: 101 mmol/L (ref 101–111)
Glucose, Bld: 121 mg/dL — ABNORMAL HIGH (ref 65–99)
Potassium: 4.2 mmol/L (ref 3.5–5.1)
SODIUM: 129 mmol/L — AB (ref 135–145)

## 2016-10-29 LAB — MAGNESIUM: Magnesium: 2.2 mg/dL (ref 1.7–2.4)

## 2016-10-29 LAB — PHOSPHORUS: PHOSPHORUS: 1.9 mg/dL — AB (ref 2.5–4.6)

## 2016-10-29 MED ORDER — SODIUM CHLORIDE 0.9 % IV SOLN
1.0000 g | Freq: Once | INTRAVENOUS | Status: AC
  Administered 2016-10-29: 1 g via INTRAVENOUS
  Filled 2016-10-29 (×2): qty 10

## 2016-10-29 MED ORDER — SODIUM PHOSPHATES 45 MMOLE/15ML IV SOLN
30.0000 mmol | Freq: Once | INTRAVENOUS | Status: AC
  Administered 2016-10-29: 30 mmol via INTRAVENOUS
  Filled 2016-10-29: qty 10

## 2016-10-29 NOTE — Progress Notes (Signed)
PT Cancellation Note  Patient Details Name: Elizabeth Mccoy MRN: [id number] DOB: 20-May-1956   Cancelled Treatment:    Reason Eval/Treat Not Completed: Medical issues which prohibited therapy (Per chart review, patient with recent medical/functional decline; family considering transition to comfort care.  Will hold services at this time until goals of care confirmed; will discontinue PT if transitions to comfort care.)   Waverly Tarquinio H. Owens Shark, PT, DPT, NCS 10/29/16, 11:46 AM [phone]

## 2016-10-29 NOTE — Progress Notes (Signed)
Ch made a follow up visit. Pt was being attended by the nurse. Friend was bedside. Pt was responsive but lethargic. Spent time just being present. Husband came into room, but I had to leave before we had a chance to converse. [place] will attempt a follow up later today.    10/29/16 1300  Clinical Encounter Type  Visited With Patient;Patient and family together  Visit Type Follow-up;Spiritual support  Referral From Nurse  Spiritual Encounters  Spiritual Needs Prayer  Stress Factors  Patient Stress Factors Health changes;Major life changes

## 2016-10-29 NOTE — Progress Notes (Signed)
[facility] NOTE  Patient Care Team: Gerilyn Pilgrim, FNP as PCP - General (Nurse Practitioner)  CHIEF COMPLAINTS/PURPOSE OF CONSULTATION:  Metastatic breast cancer  CC: Patient more lucid this morning. However cannot see. No improvement of the weakness in the left upper extremity and right lower extremity. Poor appetite. No nausea no vomiting. Neck pain stable/on morphine 2 mg every 3-4 hours.    MEDICAL HISTORY:  Past Medical History:  Diagnosis Date  . Breast cancer, right ([place]) 2015   rad tx's on right hip.  . Menopause 12/10/2006   2008    SURGICAL HISTORY: Past Surgical History:  Procedure Laterality Date  . CESAREAN SECTION      SOCIAL HISTORY: Social History   Social History  . Marital status: Married    Spouse name: N/A  . Number of children: 2  . Years of education: N/A   Occupational History  . Not on file.   Social History Main Topics  . Smoking status: Never Smoker  . Smokeless tobacco: Never Used  . Alcohol use No  . Drug use: No  . Sexual activity: Not on file   Other Topics Concern  . Not on file   Social History Narrative  . No narrative on file    FAMILY HISTORY: Family History  Problem Relation Age of Onset  . Cancer Father     prostate ca  . Cancer Sister     cancer unknown primary    ALLERGIES:  is allergic to beef extract; pork (porcine) protein; and beef-derived products.  MEDICATIONS:  Current Facility-Administered Medications  Medication Dose Route Frequency Provider Last Rate Last Dose  . 0.9 %  sodium chloride infusion   Intravenous Continuous Epifanio Lesches, MD 50 mL/hr at 10/29/16 [id number]    . acetaminophen (TYLENOL) tablet 650 mg  650 mg Oral Q6H PRN Hillary Bow, MD   650 mg at 10/23/16 2138  . calcium gluconate 1 g in sodium chloride 0.9 % 100 mL IVPB  1 g Intravenous Once Pavan Pyreddy, MD      . dexamethasone (DECADRON) injection 4 mg  4 mg Intravenous Q6H Baxter Hire, MD   4 mg at 10/29/16  [id number]  . dronabinol (MARINOL) capsule 5 mg  5 mg Oral BID AC Baxter Hire, MD   5 mg at 10/26/16 [phone]  . famotidine (PEPCID) IVPB 20 mg premix  20 mg Intravenous Q12H Epifanio Lesches, MD   20 mg at 10/28/16 2141  . feeding supplement (ENSURE ENLIVE) (ENSURE ENLIVE) liquid 237 mL  237 mL Oral BID BM Srikar Sudini, MD      . feeding supplement (OSMOLITE 1.5 CAL) liquid 210 mL  210 mL Per Tube QID Epifanio Lesches, MD   210 mL at 10/27/16 1451  . feeding supplement (PRO-STAT SUGAR FREE 64) liquid 30 mL  30 mL Per Tube TID Epifanio Lesches, MD      . haloperidol lactate (HALDOL) injection 2.5 mg  2.5 mg Intramuscular Once Epifanio Lesches, MD      . morphine 4 MG/ML injection 2 mg  2 mg Intravenous Q2H PRN Hillary Bow, MD   2 mg at 10/29/16 0846  . multivitamin liquid 15 mL  15 mL Per Tube Daily Epifanio Lesches, MD      . nystatin (MYCOSTATIN) 100000 UNIT/ML suspension 500,000 Units  5 mL Oral QID Cammie Sickle, MD   500,000 Units at 10/27/16 2115  . ondansetron (ZOFRAN) 8 mg in sodium chloride 0.9 % 50 mL  IVPB  8 mg Intravenous Q6H Baxter Hire, MD   8 mg at 10/29/16 0256  . ondansetron (ZOFRAN) injection 4 mg  4 mg Intravenous Q6H PRN Hillary Bow, MD   4 mg at 10/29/16 0153  . oxyCODONE (Oxy IR/ROXICODONE) immediate release tablet 5 mg  5 mg Oral Q4H PRN Hillary Bow, MD   5 mg at 10/22/16 2117  . piperacillin-tazobactam (ZOSYN) IVPB 3.375 g  3.375 g Intravenous Q8H Epifanio Lesches, MD   3.375 g at 10/29/16 0149  . polyethylene glycol (MIRALAX / GLYCOLAX) packet 17 g  17 g Oral Daily Epifanio Lesches, MD      . promethazine (PHENERGAN) injection 25 mg  25 mg Intravenous Q6H PRN Epifanio Lesches, MD   25 mg at 10/25/16 1308  . senna-docusate (Senokot-S) tablet 1 tablet  1 tablet Oral BID Epifanio Lesches, MD   1 tablet at 10/26/16 0842  . sodium phosphate 30 mmol in dextrose 5 % 250 mL infusion  30 mmol Intravenous Once Loree Fee, RPH           .  PHYSICAL EXAMINATION:  Vitals:   10/28/16 2024 10/29/16 0406  BP: (!) 155/76 (!) 166/91  Pulse: 76 85  Resp:  17  Temp: 97.9 F (36.6 C) 98 F (36.7 C)   Filed Weights   10/27/16 0457 10/28/16 0431 10/29/16 0500  Weight: 128 lb (58.1 kg) 121 lb (54.9 kg) 119 lb (54 kg)    GENERAL: Moderate built moderately nourished female patient resting in the bed..   Accompanied by her father/daughter. More lucid today.  EYES: no pallor or icterus OROPHARYNX: no thrush or ulceration. NECK: supple, no masses felt LYMPH:  no palpable lymphadenopathy in the cervical, axillary or inguinal regions LUNGS: decreased breath sounds to auscultation at bases and  No wheeze or crackles HEART/CVS: regular rate & rhythm and no murmurs; No lower extremity edema ABDOMEN: abdomen soft, non-tender and normal bowel sounds Musculoskeletal:no cyanosis of digits and no clubbing  PSYCH: Sleepy; arousable easily. More lucid today.  NEURO:  Sleepy; but oriented x3; Cannot see.  werakness 4/5 left UE; 3/5 weakness in Right LE SKIN:  no rashes or significant lesions  LABORATORY DATA:  I have reviewed the data as listed Lab Results  Component Value Date   WBC 14.3 (H) 10/26/2016   HGB 12.0 10/26/2016   HCT 35.6 10/26/2016   MCV 95.2 10/26/2016   PLT 150 10/26/2016    Recent Labs  10/21/16 1458  10/23/16 0457 10/26/16 0356 10/27/16 0541 10/28/16 0548 10/29/16 0528  NA 127*  < > 133* 130* 128* 130* 129*  K 4.4  < > 4.4 3.9 4.2 4.1 4.2  CL 97*  < > 104 101 100* 102 101  CO2 19*  < > 21* 21* 21* 21* 20*  GLUCOSE 117*  < > 135* 133* 127* 127* 121*  BUN 17  < > 12 13 11 11 12   CREATININE 0.48  < > 0.40* 0.37* 0.31* 0.36* <0.30*  CALCIUM 7.8*  < > 7.4* 6.9* 6.1* 6.2* 6.1*  GFRNONAA >60  < > >60 >60 >60 >60 NOT CALCULATED  GFRAA >60  < > >60 >60 >60 >60 NOT CALCULATED  PROT 7.1  --  6.3* 6.3*  --   --   --   ALBUMIN 4.2  --  3.7 3.5  --   --   --   AST 54*  --  55* 85*  --   --   --  ALT 64*  --   81* 217*  --   --   --   ALKPHOS 229*  --  164* 164*  --   --   --   BILITOT 1.0  --  0.8 2.7*  --   --   --   BILIDIR  --   --   --  1.4*  --   --   --   IBILI  --   --   --  1.3*  --   --   --   < > = values in this interval not displayed.  RADIOGRAPHIC STUDIES: I have personally reviewed the radiological images as listed and agreed with the findings in the report. Ct Head Wo Contrast  Result Date: 10/21/2016 CLINICAL DATA:  Metastatic breast cancer. EXAM: CT HEAD WITHOUT CONTRAST TECHNIQUE: Contiguous axial images were obtained from the base of the skull through the vertex without intravenous contrast. COMPARISON:  MRI head 10/12/2016 FINDINGS: Brain: Progressive ventricular dilatation since the prior MRI. This is most consistent with hydrocephalus in this patient with metastatic disease and may be due to leptomeningeal tumor. No obstructing mass lesion. Multiple small metastatic deposits are seen throughout the brain on the MRI. These are not well seen on unenhanced CT however there is mild edema in the right occipital lobe and in the right frontal lobe over the convexity consistent with metastatic disease. Negative for hemorrhage. Vascular: Negative for dense MCA Skull: Patchy sclerotic disease in the clivus compatible with metastatic disease. No destructive skull lesion. Sinuses/Orbits: Infiltration of the orbit with enlargement of the extra-ocular muscles most consistent with metastatic disease to the orbit as identified on MRI. Other: None IMPRESSION: Progressive ventricular dilatation since the recent MRI compatible with hydrocephalus. 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 Multiple brain metastasis better seen on MRI. No large obstructing mass lesion is identified. Negative for hemorrhage Metastatic disease to the clivus. Metastatic disease to the orbit bilaterally. Electronically Signed   By: Franchot Gallo M.D.   On: 10/21/2016 17:22   Ct Chest W  Contrast  Result Date: 10/11/2016 CLINICAL DATA:  Metastatic breast cancer. EXAM: CT CHEST, ABDOMEN, AND PELVIS WITH CONTRAST TECHNIQUE: Multidetector CT imaging of the chest, abdomen and pelvis was performed following the standard protocol during bolus administration of intravenous contrast. CONTRAST:  138mL ISOVUE-300 IOPAMIDOL (ISOVUE-300) INJECTION 61% COMPARISON:  08/29/2016 FINDINGS: CT CHEST FINDINGS Cardiovascular: The heart size appears normal. Aortic atherosclerosis noted. Mediastinum/Nodes: The trachea appears patent and is midline. Normal appearance of the esophagus. Right axillary node measures 9 mm, image number 18 of series 2. Previously this measured the same. Interval decrease in soft tissue stranding within the right axilla which may be related to prior instrumentation. No enlarged mediastinal or hilar nodes. Lungs/Pleura: Small right pleural effusion. Mild diffuse bronchial wall thickening noted. Left upper lobe pulmonary nodule is unchanged measuring 4 mm, image 56 of series 4. Perifissural nodule in the left lower lobe is stable measuring 4 mm, image number 80 of series 4. No new or enlarging pulmonary nodules identified Musculoskeletal: Extensive, widespread sclerotic bone metastases again noted. Subjectively increased to previous exam. CT ABDOMEN PELVIS FINDINGS Hepatobiliary: Multifocal liver metastases again identified. Index lesion within the right lobe measures 2.6 cm, image 41 of series 2. Previously this measured the same. Lateral segment of left lobe of liver lesion measures 1.5 cm, image 49 of series 2. Previously 2.1 cm. The index lesion within the lateral right lobe of liver  measures 2 cm, image 51 of series 2. Previously 2.3 cm. Index lesion within the inferior right lobe measures 2.4 cm, image 59 of series 2. Previously 2.6 cm. Gallbladder appears normal. No biliary dilatation. Pancreas: Normal appearance of the pancreas. Spleen: No change in small low density structure within  the spleen measuring 6 mm, image 51 of series 2. Adrenals/Urinary Tract: The adrenal glands are normal. There is persistent bilateral pelvocaliectasis. No hydroureter. Enhancing nodule along the anterior wall of the bladder is again noted, image 81 89 of series 6. Stomach/Bowel: The stomach is normal. The small bowel loops have a normal caliber. No bowel obstruction. No pathologic dilatation of the colon. Increase scratch set there is mild thickening of the appendix which measures 8 mm in diameter, image 77 of series 2. No free fluid or fluid collections noted within in the right lower quadrant. Vascular/Lymphatic: Normal appearance of the abdominal aorta. No enlarged upper abdominal lymph nodes. There is no pelvic or inguinal adenopathy. Reproductive: The uterus appears unremarkable. The right ovary measures 2.8 x 2.0 cm, image 89 of series 2. Previously 2.8 x 1.5 cm. Other: There is a small amount of free fluid identified within the pelvis. This is new from previous exam. Additionally, there is a suggestion subtle soft tissue nodularity within the peritoneal fat. For example, image number 64 of series 2 Musculoskeletal: Diffuse all osseous sclerosis compatible secondary to metastatic disease is again identified. IMPRESSION: 1. Index liver lesions are slightly decreased in size from previous exam. 2. Widespread sclerotic bone metastasis as noted previously. Subjectively there may be an increase in the sclerosis which could reflect healing of bone metastases or progression of sclerotic bone metastases. 3. New ascites identified within the pelvis. Subtle areas of soft tissue nodularity within the peritoneum is concerning for development of peritoneal metastases. 4. Slight enlargement of the right ovary. Attention to the right ovary on follow-up imaging to monitor for potential ovarian metastases. Electronically Signed   By: Kerby Moors M.D.   On: 10/11/2016 11:59   Mr Jeri Cos [id number] Contrast  Result Date:  10/12/2016 CLINICAL DATA:  60 year old female with metastatic breast cancer. Staging. Subsequent encounter. EXAM: MRI HEAD WITHOUT AND WITH CONTRAST TECHNIQUE: Multiplanar, multiecho pulse sequences of the brain and surrounding structures were obtained without and with intravenous contrast. CONTRAST:  75mL MULTIHANCE GADOBENATE DIMEGLUMINE 529 MG/ML IV SOLN COMPARISON:  PET-CT 11/14/2015. FINDINGS: Brain: Numerous small brain metastases. There are at least 15 small supratentorial metastases ranging from punctate to 10 mm diameter. There is also extensive metastatic disease affecting the cerebellum with widespread small areas of abnormal enhancement, many of which are suspicious for leptomeningeal disease (series 16 images 14 and 15). The largest individual cerebellar lesions are 8-9 mm. Despite the extensive disease there is only occasional mild cerebral and cerebellar edema, and no significant intracranial mass effect. No hemorrhagic metastases. No abnormal ependymoma 1 enhancement. No pachymeningeal thickening or enhancement. No restricted diffusion or evidence of acute infarction. No ventriculomegaly. No acute intracranial hemorrhage identified. Negative pituitary. Negative cervicomedullary junction. No chronic cerebral blood products identified. There are small chronic lacunar infarcts in the left cerebellum. Subtle chronic cortical encephalomalacia in the right parietal lobe (series 9, image 21) compatible with previous small posterior right MCA infarct. Vascular: Major intracranial vascular flow voids are preserved, the distal left vertebral artery appears dominant. Skull and upper cervical spine: Diffuse abnormal bone marrow signal throughout the cervical spine and at the skullbase in keeping with widespread osseous metastatic disease. No destructive calvarial  lesion identified. Negative visualized cervical spinal cord. Sinuses/Orbits: Abnormal heterogeneous appearance of the bilateral intraorbital soft  tissues including the right lateral rectus muscle which is expanded in T2 hyperintense (series 14, image 20 and series 9, image 9). Similar heterogeneity throughout the left orbital soft tissues. There seems to be a mild degree of enophthalmos. The globes appear remain intact. Trace paranasal sinus mucosal thickening. Other: Mastoids are clear. Visible internal auditory structures appear normal. Negative scalp soft tissues. IMPRESSION: 1. Widespread metastatic disease to the brain. At least 15 small supratentorial metastases ranging from punctate to 10 mm diameter, and extensive cerebellar metastatic disease which appears highly suspicious for leptomeningeal metastases. 2. Only mild scattered brain edema and no significant intracranial mass effect. 3. Bilateral orbit soft tissue metastases (scirrhous infiltration of the orbits). 4. Widespread osseous metastatic disease. Electronically Signed   By: Genevie Ann M.D.   On: 10/12/2016 13:18   Mr Thoracic Spine W Wo Contrast  Result Date: 10/23/2016 CLINICAL DATA:  Metastatic breast cancer. EXAM: MRI THORACIC AND LUMBAR SPINE WITHOUT AND WITH CONTRAST TECHNIQUE: Multiplanar and multiecho pulse sequences of the thoracic and lumbar spine were obtained without and with intravenous contrast. CONTRAST:  69mL MULTIHANCE GADOBENATE DIMEGLUMINE 529 MG/ML IV SOLN COMPARISON:  Bone scan 08/29/2016 FINDINGS: MRI THORACIC SPINE FINDINGS Alignment:  Physiologic. Vertebrae: Heterogeneous marrow diffusely from extensive metastatic disease. Every thoracic vertebra is involved. No extraosseous tumor growth is noted in the epidural or foraminal spaces. No pathologic fracture. Cord: The cord has normal signal, but there is diffuse surface enhancement of the cord and intrathecal roots. Paraspinal and other soft tissues: Known hepatic metastatic disease. Trace right pleural effusion. Disc levels: No degenerative impingement MRI LUMBAR SPINE FINDINGS Segmentation:  Standard. Alignment:   Physiologic. Vertebrae: Metastases throughout all lumbar levels. No pathologic fracture or epidural/ foraminal infiltration. Conus medullaris: Extends to the T12 level and appears normal. There is diffuse thickening enhancement of the cauda equina Paraspinal and other soft tissues: The bladder is over distended and there is bilateral moderate hydroureteronephrosis. Disc levels: Lower lumbar facet arthropathy.  No degenerative impingement. These results will be called to the ordering clinician or representative by the Radiologist Assistant, and communication documented in the PACS or zVision Dashboard. IMPRESSION: 1. 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 2. Widespread osseous metastatic disease without pathologic fracture or detected epidural infiltration. 3. Over distended bladder with bilateral hydronephrosis, correlate for retention related to #1. 4. Known hepatic metastatic disease. Electronically Signed   By: Monte Fantasia M.D.   On: 10/23/2016 13:37   Mr Lumbar Spine W Wo Contrast  Result Date: 10/23/2016 CLINICAL DATA:  Metastatic breast cancer. EXAM: MRI THORACIC AND LUMBAR SPINE WITHOUT AND WITH CONTRAST TECHNIQUE: Multiplanar and multiecho pulse sequences of the thoracic and lumbar spine were obtained without and with intravenous contrast. CONTRAST:  68mL MULTIHANCE GADOBENATE DIMEGLUMINE 529 MG/ML IV SOLN COMPARISON:  Bone scan 08/29/2016 FINDINGS: MRI THORACIC SPINE FINDINGS Alignment:  Physiologic. Vertebrae: Heterogeneous marrow diffusely from extensive metastatic disease. Every thoracic vertebra is involved. No extraosseous tumor growth is noted in the epidural or foraminal spaces. No pathologic fracture. Cord: The cord has normal signal, but there is diffuse surface enhancement of the cord and intrathecal roots. Paraspinal and other soft tissues: Known hepatic metastatic disease. Trace right pleural effusion. Disc  levels: No degenerative impingement MRI LUMBAR SPINE FINDINGS Segmentation:  Standard. Alignment:  Physiologic. Vertebrae: Metastases throughout all lumbar levels. No pathologic fracture or epidural/ foraminal  infiltration. Conus medullaris: Extends to the T12 level and appears normal. There is diffuse thickening enhancement of the cauda equina Paraspinal and other soft tissues: The bladder is over distended and there is bilateral moderate hydroureteronephrosis. Disc levels: Lower lumbar facet arthropathy.  No degenerative impingement. These results will be called to the ordering clinician or representative by the Radiologist Assistant, and communication documented in the PACS or zVision Dashboard. IMPRESSION: 1. 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 2. Widespread osseous metastatic disease without pathologic fracture or detected epidural infiltration. 3. Over distended bladder with bilateral hydronephrosis, correlate for retention related to #1. 4. Known hepatic metastatic disease. Electronically Signed   By: Monte Fantasia M.D.   On: 10/23/2016 13:37   Ct Abdomen Pelvis W Contrast  Result Date: 10/11/2016 CLINICAL DATA:  Metastatic breast cancer. EXAM: CT CHEST, ABDOMEN, AND PELVIS WITH CONTRAST TECHNIQUE: Multidetector CT imaging of the chest, abdomen and pelvis was performed following the standard protocol during bolus administration of intravenous contrast. CONTRAST:  164mL ISOVUE-300 IOPAMIDOL (ISOVUE-300) INJECTION 61% COMPARISON:  08/29/2016 FINDINGS: CT CHEST FINDINGS Cardiovascular: The heart size appears normal. Aortic atherosclerosis noted. Mediastinum/Nodes: The trachea appears patent and is midline. Normal appearance of the esophagus. Right axillary node measures 9 mm, image number 18 of series 2. Previously this measured the same. Interval decrease in soft tissue stranding within the right axilla which may be related to  prior instrumentation. No enlarged mediastinal or hilar nodes. Lungs/Pleura: Small right pleural effusion. Mild diffuse bronchial wall thickening noted. Left upper lobe pulmonary nodule is unchanged measuring 4 mm, image 56 of series 4. Perifissural nodule in the left lower lobe is stable measuring 4 mm, image number 80 of series 4. No new or enlarging pulmonary nodules identified Musculoskeletal: Extensive, widespread sclerotic bone metastases again noted. Subjectively increased to previous exam. CT ABDOMEN PELVIS FINDINGS Hepatobiliary: Multifocal liver metastases again identified. Index lesion within the right lobe measures 2.6 cm, image 41 of series 2. Previously this measured the same. Lateral segment of left lobe of liver lesion measures 1.5 cm, image 49 of series 2. Previously 2.1 cm. The index lesion within the lateral right lobe of liver measures 2 cm, image 51 of series 2. Previously 2.3 cm. Index lesion within the inferior right lobe measures 2.4 cm, image 59 of series 2. Previously 2.6 cm. Gallbladder appears normal. No biliary dilatation. Pancreas: Normal appearance of the pancreas. Spleen: No change in small low density structure within the spleen measuring 6 mm, image 51 of series 2. Adrenals/Urinary Tract: The adrenal glands are normal. There is persistent bilateral pelvocaliectasis. No hydroureter. Enhancing nodule along the anterior wall of the bladder is again noted, image 81 89 of series 6. Stomach/Bowel: The stomach is normal. The small bowel loops have a normal caliber. No bowel obstruction. No pathologic dilatation of the colon. Increase scratch set there is mild thickening of the appendix which measures 8 mm in diameter, image 77 of series 2. No free fluid or fluid collections noted within in the right lower quadrant. Vascular/Lymphatic: Normal appearance of the abdominal aorta. No enlarged upper abdominal lymph nodes. There is no pelvic or inguinal adenopathy. Reproductive: The uterus  appears unremarkable. The right ovary measures 2.8 x 2.0 cm, image 89 of series 2. Previously 2.8 x 1.5 cm. Other: There is a small amount of free fluid identified within the pelvis. This is new from previous exam. Additionally, there is a suggestion subtle soft tissue nodularity within the  peritoneal fat. For example, image number 64 of series 2 Musculoskeletal: Diffuse all osseous sclerosis compatible secondary to metastatic disease is again identified. IMPRESSION: 1. Index liver lesions are slightly decreased in size from previous exam. 2. Widespread sclerotic bone metastasis as noted previously. Subjectively there may be an increase in the sclerosis which could reflect healing of bone metastases or progression of sclerotic bone metastases. 3. New ascites identified within the pelvis. Subtle areas of soft tissue nodularity within the peritoneum is concerning for development of peritoneal metastases. 4. Slight enlargement of the right ovary. Attention to the right ovary on follow-up imaging to monitor for potential ovarian metastases. Electronically Signed   By: Kerby Moors M.D.   On: 10/11/2016 11:59   Dg Chest Port 1 View  Result Date: 10/25/2016 CLINICAL DATA:  PICC line placement EXAM: PORTABLE CHEST 1 VIEW COMPARISON:  10/11/2016 chest CT FINDINGS: The heart is normal in size. There is aortic atherosclerosis. Diffuse axial and appendicular skeletal metastasis is noted. PICC line tip terminates at the cavoatrial junction from a right-sided approach. New car fell linear pulmonary opacities in the right upper lobe cannot exclude pneumonia. The left lung is clear. There is no effusion. IMPRESSION: PICC line tip in the cavoatrial junction. Patchy airspace opacities in the right upper lobe are new and may reflect pneumonia. Diffuse osteoblastic skeletal metastatic disease. Electronically Signed   By: Ashley Royalty M.D.   On: 10/25/2016 22:17   Dg Addison Bailey G Tube Plc W/fl W/rad  Result Date:  10/26/2016 CLINICAL DATA:  NG tube placement EXAM: NASO G TUBE PLACEMENT WITH FL AND WITH RAD FLUOROSCOPY TIME:  Fluoroscopy Time:  6 second Radiation Exposure Index (if provided by the fluoroscopic device): Number of Acquired Spot Images: 0 COMPARISON:  None. FINDINGS: NG tube was placed by the radiologist. Single spot image over the average demonstrates the NG tube tip in the proximal stomach. IMPRESSION: NG tube tip in the proximal stomach. Electronically Signed   By: Rolm Baptise M.D.   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 Clinically worsening patient with new onset of visual impairment; cortical blindness. Continue steroids. Continue radiation for now.    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 Elevated LFTs- likley secondary to progression of disease.  Awaiting palliative care consultation this morning. Spoke to [employer]; Palliative care RN>   # Pain control- continue IV morphine every 3-4 hours.   # discussed re: code status; No CODE.   # Poor nutrition- patient not a candidate for TPN ; patient pulled NG tube out. Discussed that in the context of her progressive malignancy - I would recommend focusing on symptom management. Discussed with the patient's daughter.   # CXR- possible pneumonia- recommend Levaquin.    Cammie Sickle, MD 10/29/2016 9:28 AM

## 2016-10-29 NOTE — Progress Notes (Signed)
[facility] NAME: Elizabeth Mccoy    MR#:  [id number]  DATE OF BIRTH:  24-Feb-1956  SUBJECTIVE:  Patient is feeling nauseous, not feeling good  CHIEF COMPLAINT:   Chief Complaint  Patient presents with  . Nausea  . Dizziness    REVIEW OF SYSTEMS:    Review of Systems  Constitutional: Positive for malaise/fatigue. Negative for chills and fever.  HENT: Negative for hearing loss and sore throat.   Eyes: Negative for blurred vision, double vision, photophobia and pain.  Respiratory: Negative for cough, hemoptysis, shortness of breath and wheezing.   Cardiovascular: Negative for chest pain, palpitations, orthopnea and leg swelling.  Gastrointestinal: Positive for nausea. Negative for abdominal pain, constipation, diarrhea, heartburn and vomiting.  Genitourinary: Negative for dysuria, hematuria and urgency.  Musculoskeletal: Negative for back pain, joint pain, myalgias and neck pain.  Skin: Negative for rash.  Neurological: Positive for dizziness, focal weakness and weakness. Negative for sensory change, speech change, seizures and headaches.  Endo/Heme/Allergies: Does not bruise/bleed easily.  Psychiatric/Behavioral: Negative for depression and memory loss. The patient is not nervous/anxious and does not have insomnia.     DRUG ALLERGIES:   Allergies  Allergen Reactions  . Beef Extract Anaphylaxis  . Pork (Porcine) Protein Anaphylaxis  . Beef-Derived Products Hives    VITALS:  Blood pressure (!) 166/91, pulse 85, temperature 98 F (36.7 C), temperature source Oral, resp. rate 17, height 5\' 1"  (1.549 m), weight 54 kg (119 lb), SpO2 100 %.  PHYSICAL EXAMINATION:   Physical Exam  GENERAL:  60 y.o.-year-old patient lying in the bed with no acute distress. Appears cachectic. EYES: Pupils equal, round, reactive to light and accommodation. No scleral icterus. Extraocular muscles intact.  HEENT: Head atraumatic, normocephalic.  Oropharynx and nasopharynx clear.  NECK:  Supple, no jugular venous distention. No thyroid enlargement, no tenderness.  LUNGS: Normal breath sounds bilaterally, no wheezing, rales, rhonchi. No use of accessory muscles of respiration.  CARDIOVASCULAR: S1, S2 normal. No murmurs, rubs, or gallops.  ABDOMEN: Soft, nontender, nondistended. Bowel sounds present. No organomegaly or mass.  EXTREMITIES: No cyanosis, clubbing or edema b/l.    NEUROLOGIC: Patient is weak on right upper and lower extremities. PSYCHIATRIC: The patient is alert and oriented, SKIN: No obvious rash, lesion, or ulcer.   LABORATORY PANEL:   CBC  Recent Labs Lab 10/26/16 0356  WBC 14.3*  HGB 12.0  HCT 35.6  PLT 150   ------------------------------------------------------------------------------------------------------------------ Chemistries   Recent Labs Lab 10/26/16 0356  10/29/16 0528  NA 130*  < > 129*  K 3.9  < > 4.2  CL 101  < > 101  CO2 21*  < > 20*  GLUCOSE 133*  < > 121*  BUN 13  < > 12  CREATININE 0.37*  < > <0.30*  CALCIUM 6.9*  < > 6.1*  MG 2.4  < > 2.2  AST 85*  --   --   ALT 217*  --   --   ALKPHOS 164*  --   --   BILITOT 2.7*  --   --   < > = values in this interval not displayed. ------------------------------------------------------------------------------------------------------------------  Cardiac Enzymes No results for input(s): TROPONINI in the last 168 hours. ------------------------------------------------------------------------------------------------------------------  RADIOLOGY:  No results found.   ASSESSMENT AND PLAN:   * Nausea, vomiting  With worsening hadache-secondary to metastatic breast cancer with brain metastases- possible worsening of intracranial pressure Now has aphasia  New onset visual  impairment  Started on tube feeding,  But patient has pulled the NG tube , not a good cadidte for TPN continue aspiration precautions Continue steroids. Continue  radiation for now as per oncology rcommendations  Appreciate oncology conult   * Elevated lipase No abdominal tenderness. Lipase has returned to normal. Possible pancreatitis but CT scan normal. Monitor.  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 On IV steroids.  Port placement is on hold because of decline in medical condition  patient's prognosis is really poor,  CODE STATUS changed to DO NOT RESUSCITATE by her husband patiet is agreeable Continue supportive care. Patient is thinking about comfort care but not ready yet  palliative care is meeting with the family members today   Aspiration pneumonia:  on Zosyn, continue strict aspiration precautions,  ,  Hyponatremia;: Continue IV fluids   D/w husband,RN  All the records are reviewed and case discussed with Care Management/Social Workerr. Management plans discussed with the patient, family and they are in agreement.  CODE STATUS: FULL CODE  DVT Prophylaxis: SCDs  TOTAL TIME TAKING CARE OF THIS PATIENT: 35 minutes.   POSSIBLE D/C IN 1-2 DAYS, DEPENDING ON CLINICAL CONDITION.  Nicholes Mango M.D on 10/29/2016 at 2:14 PM  Between 7am to 6pm - Pager - [phone]  After 6pm go to [email or web address] - password [email or web address] [facility]  Office  [phone]  CC: Primary care physician; Gerilyn Pilgrim, FNP  Note: This dictation was prepared with Dragon dictation along with smaller phrase technology. Any transcriptional errors that result from this process are unintentional.

## 2016-10-29 NOTE — Progress Notes (Signed)
[facility] for electrolyte management  Indication: hypophosphatemia   Pharmacy consulted for electrolyte management for 60 yo female severe malnutrition in setting of  Metastatic breast cancer. Patient does not meet recommended requirements for receiving TPN and is having a NG tube placed   Plan:   No NGT placed at this time and patient is not receiving feeding supplements. Called nurse and MD is currently talking to family about comfort care at this time. Patient with low serum phosphorus and at risk for refeeding syndrome. At this time, will replace with sodium phosphate 30mmol IV x 1 again. Will recheck all electrolytes with am labs.   Allergies  Allergen Reactions  . Beef Extract Anaphylaxis  . Pork (Porcine) Protein Anaphylaxis  . Beef-Derived Products Hives    Patient Measurements: Height: 5\' 1"  (154.9 cm) Weight: 119 lb (54 kg) IBW/kg (Calculated) : 47.8   Vital Signs: Temp: 98 F (36.7 C) (11/20 0406) Temp Source: Oral (11/20 0406) BP: 166/91 (11/20 0406) Pulse Rate: 85 (11/20 0406) Intake/Output from previous day: 11/19 0701 - 11/20 0700 In: [id number] [I.V.:740; IV Piggyback:723] Out: 1950 [Urine:1950] Intake/Output from this shift: No intake/output data recorded.  Labs:  Recent Labs  10/27/16 0541 10/28/16 0548 10/29/16 0528  CREATININE 0.31* 0.36* <0.30*  MG 2.2 2.2 2.2  PHOS 1.6* 1.5* 1.9*   CrCl cannot be calculated (This lab value cannot be used to calculate CrCl because it is not a number: <0.30).   Pharmacy will continue to monitor and adjust per consult.    Loree Fee, PharmD 10/29/2016,8:25 AM

## 2016-10-29 NOTE — Plan of Care (Signed)
Problem: Fluid Volume: Goal: Ability to maintain a balanced intake and output will improve Outcome: Not Progressing Pt with generalized pain. Morphine 2 mg IV prn has helped control pt pain. Pt on soft diet but has poor po intake. Scheduled Zofran IV 8 mg. Also, pt required prn dose of 4 mg.NGT no longer in place. Per husband NGT not to be reinserted. Pt seen by palliative care. Decision to made in regards to comfort care measures today. Pt currently DNR/NMP.

## 2016-10-29 NOTE — Plan of Care (Signed)
Problem: Pain Managment: Goal: General experience of comfort will improve Outcome: Progressing Morphine 2 mg IV given throughout shift. Improvement noted.  Problem: Fluid Volume: Goal: Ability to maintain a balanced intake and output will improve Outcome: Progressing Remains on IVF's. Foley catheter intact.

## 2016-10-29 NOTE — Progress Notes (Signed)
SLP Cancellation Note  Patient Details Name: Elizabeth Mccoy MRN: [id number] DOB: [birth year]   Cancelled treatment:       Reason Eval/Treat Not Completed: Medical issues which prohibited therapy;Patient declined, no reason specified (ST services will f/u each day; Palliative Care ongoing); goals of care are being confirmed this afternoon w/ another family meeting w/ palliative care. Pt has been recommended to take ice chips for swallowing and oral hygiene/comfort; general aspiration precautions. Noted MD note re: NG tube.     [name], [place], CCC-SLP  Watson,Katherine 10/29/2016, 2:39 PM

## 2016-10-29 NOTE — Consult Note (Signed)
Consultation Note Date: 10/29/2016   Patient Name: Elizabeth Mccoy  DOB: 1956/07/09  MRN: [id number]  Age / Sex: 60 y.o., female  PCP: Elizabeth Pilgrim, FNP Referring Physician: Nicholes Mango, MD  Reason for Consultation: Establishing goals of care  HPI/Patient Profile: 60 y.o. female  with past medical history of breast cancer with metastases to liver, bone, and brain including leptomeningeal metastasis admitted on 10/21/2016 with poor PO intake, dehydration, nausea, and weakness. Worsening headache and nausea from leptomeningeal mets with new onset blindness. Patient receiving steroids and radiation. Also with elevated LFT's likely due to disease progression per Dr. Rogue Mccoy. Poor nutrition-patient is not a candidate for TPN and NGT is out. Very poor prognosis. She did receive radiation today, 11/20. Palliative medicine consultation for goals of care.   Clinical Assessment and Goals of Care: This NP initially met with patient, daughter, and husband at bedside. Introduced palliative care. Patient wakes easily to voice and answering all questions but very withdrawn with short answers. States pain relief from IV morphine. Patient tells me she feels "in a daze about all of this." She also tells me she has a lot to live for with a 53 year old son. She denies concerns or fears.   Met with husband and his sister Elizabeth Mccoy) outside of the room. They both have a good understanding of disease progression and poor prognosis. They do not want to see her suffer. Elizabeth Mccoy tells me the patient wants to continue to fight because she has a 46 year old son with Down Syndrome but that she is not afraid to die. She has been battling cancer for over two years now. Husband tells me that her daughter, Elizabeth Mccoy, is from another marriage and they do not get along very well. Husband speaks of the challenging family dynamics with daughter and  sister Elizabeth Mccoy) who both want to continue aggressive treatment and do not agree with hospice because it will "kill her." Husband also tells me that the patient will tell him different wishes than his daughter. A few days ago, the patient told him she was ready to go but today, when Elizabeth Mccoy is at the bedside, she tells [place] she has a lot to live for.   Discussed with husband and sister-in-law in detail comfort measures, no escalation of care, symptom management, and residential hospice vs. Home hospice. They would both want to take her home but unsure if they could manage her or not. Husband wants comfort but feels that the rest of the family is against him.   Later on this afternoon, met with sister Elizabeth Mccoy). She tells me that she knows this cancer will kill her sister but she is hopeful to get "six more months with her." She knows this is most likely not possible but despite detailed education from this NP on disease progression and EOL expectations regarding symptoms and nutrition, Elizabeth Mccoy that her sister will "perk up" if she has some nutrition in her system. Elizabeth Mccoy feels that the doctors are giving up on her sister and just going to "pump  her with morphine till she dies." Discussed my concern with continued aggressive interventions that may cause more harm than good to the patient. Discussed that artificial nutrition may prolong her life but most likely not give better quality of life. Spoke of the importance of keeping the patient at the center of decision making. Answered questions. Offered emotional support. I told Elizabeth Mccoy that I will talk with the patient alone in the morning to see if she will open up and tell me her thoughts and wishes with no family around. Elizabeth Mccoy agrees with this.   Answered all questions for family. Provided emotional support.   NEXT OF KIN-husband Elizabeth Mccoy)   SUMMARY OF RECOMMENDATIONS    PMT will continue to discuss Elizabeth Mccoy and educate patient and family on disease  progression, poor prognosis, natural trajectory of cancer, and EOL expectations. Challenging family dynamics. Family is not on the same page and not ready for transition to comfort measures only.   Code Status/Advance Care Planning:  DNR   Symptom Management:   Per attending  Palliative Prophylaxis:   Aspiration, Delirium Protocol, Frequent Pain Assessment and Turn Reposition  Psycho-social/Spiritual:   Desire for further Chaplaincy support:yes  Additional Recommendations: Caregiving  Support/Resources and Education on Hospice  Prognosis:   Unable to determine-poor prognosis-likely days to weeks. Metastatic breast cancer to bone, liver, and leptomeningeal involvement. Severe nutrition and functional status decline.   Discharge Planning: To Be Determined      Primary Diagnoses: Present on Admission: . Intractable nausea and vomiting   I have reviewed the medical record, interviewed the patient and family, and examined the patient. The following aspects are pertinent.  Past Medical History:  Diagnosis Date  . Breast cancer, right ([place]) 2015   rad tx's on right hip.  . Menopause 12/10/2006   2008   Social History   Social History  . Marital status: Married    Spouse name: N/A  . Number of children: 2  . Years of education: N/A   Social History Main Topics  . Smoking status: Never Smoker  . Smokeless tobacco: Never Used  . Alcohol use No  . Drug use: No  . Sexual activity: Not Asked   Other Topics Concern  . None   Social History Narrative  . None   Family History  Problem Relation Age of Onset  . Cancer Father     prostate ca  . Cancer Sister     cancer unknown primary   Scheduled Meds: . calcium gluconate  1 g Intravenous Once  . dexamethasone  4 mg Intravenous Q6H  . dronabinol  5 mg Oral BID AC  . famotidine (PEPCID) IV  20 mg Intravenous Q12H  . feeding supplement (ENSURE ENLIVE)  237 mL Oral BID BM  . feeding supplement (OSMOLITE 1.5 CAL)   210 mL Per Tube QID  . feeding supplement (PRO-STAT SUGAR FREE 64)  30 mL Per Tube TID  . haloperidol lactate  2.5 mg Intramuscular Once  . multivitamin  15 mL Per Tube Daily  . nystatin  5 mL Oral QID  . ondansetron (ZOFRAN) IV  8 mg Intravenous Q6H  . piperacillin-tazobactam (ZOSYN)  IV  3.375 g Intravenous Q8H  . polyethylene glycol  17 g Oral Daily  . senna-docusate  1 tablet Oral BID  . sodium phosphate  Dextrose 5% IVPB  30 mmol Intravenous Once   Continuous Infusions: . sodium chloride 50 mL/hr at 10/29/16 0632   PRN Meds:.acetaminophen, morphine injection, ondansetron (ZOFRAN) IV, oxyCODONE, promethazine  Medications Prior to Admission:  Prior to Admission medications   Medication Sig Start Date End Date Taking? Authorizing Provider  calcium-vitamin D (OSCAL WITH D) 500-200 MG-UNIT per tablet Take 1 tablet by mouth 3 (three) times daily.   Yes Historical Provider, MD  dexamethasone (DECADRON) 4 MG tablet Every 8 hours with food. Patient taking differently: Take 4 mg by mouth every 6 (six) hours. Every 8 hours with food. 10/12/16  Yes Cammie Sickle, MD  dronabinol (MARINOL) 5 MG capsule Take 1 capsule (5 mg total) by mouth 2 (two) times daily before a meal. 10/10/16  Yes Cammie Sickle, MD  loratadine (CLARITIN) 10 MG tablet Take 10 mg by mouth daily.   Yes Historical Provider, MD  omeprazole (PRILOSEC) 20 MG capsule Take 20 mg by mouth every evening. Take at supper   Yes Historical Provider, MD  prochlorperazine (COMPAZINE) 10 MG tablet Take 1 tablet by mouth every 8 (eight) hours as needed.    Yes Historical Provider, MD  traMADol (ULTRAM) 50 MG tablet Take 1 tablet (50 mg total) by mouth every 8 (eight) hours as needed. 10/18/16  Yes Cammie Sickle, MD  lidocaine-prilocaine (EMLA) cream Apply 1 application topically as needed. 10/11/16   Cammie Sickle, MD   Allergies  Allergen Reactions  . Beef Extract Anaphylaxis  . Pork (Porcine) Protein Anaphylaxis  .  Beef-Derived Products Hives   Review of Systems  Constitutional: Positive for activity change, appetite change and fatigue.  Eyes:       Blindness  Gastrointestinal: Positive for nausea.  Neurological: Positive for weakness.   Physical Exam  Constitutional: She is oriented to person, place, and time. She is cooperative. She is easily aroused.  Cardiovascular: Regular rhythm and normal heart sounds.   Pulmonary/Chest: Effort normal. No tachypnea. No respiratory distress. She has decreased breath sounds.  Abdominal: Soft.  Neurological: She is oriented to person, place, and time and easily aroused.  Skin: Skin is warm and dry. There is pallor.  Psychiatric: Her speech is normal. She is withdrawn. Cognition and memory are normal.  Nursing note and vitals reviewed.   Vital Signs: BP (!) 166/91 (BP Location: Left Arm)   Pulse 85   Temp 98 F (36.7 C) (Oral)   Resp 17   Ht 5' 1"  (1.549 m)   Wt 54 kg (119 lb)   LMP  (Approximate)   SpO2 100%   BMI 22.48 kg/m  Pain Assessment: PAINAD POSS *See Group Information*: 1-Acceptable,Awake and alert Pain Score: Asleep   SpO2: SpO2: 100 % O2 Device:SpO2: 100 % O2 Flow Rate: .   IO: Intake/output summary:   Intake/Output Summary (Last 24 hours) at 10/29/16 1553 Last data filed at 10/29/16 0538  Gross per 24 hour  Intake             1463 ml  Output             1050 ml  Net              413 ml    LBM: Last BM Date: 10/24/16 Baseline Weight: Weight: 55.8 kg (123 lb) Most recent weight: Weight: 54 kg (119 lb)     Palliative Assessment/Data: PPS 20%   Flowsheet Rows   Flowsheet Row Most Recent Value  Intake Tab  Referral Department  Oncology  Unit at Time of Referral  Oncology Unit  Palliative Care Primary Diagnosis  Cancer  Date Notified  10/26/16  Palliative Care Type  New Palliative care  Reason for referral  Clarify Goals of Care  Date of Admission  10/21/16  Date first seen by Palliative Care  10/29/16  # of days IP  prior to Palliative referral  5  Clinical Assessment  Palliative Performance Scale Score  20%  Psychosocial & Spiritual Assessment  Palliative Care Outcomes  Patient/Family meeting held?  Yes  Who was at the meeting?  husband and sister-in-law, and then sister later on that afternoon  Palliative Care Outcomes  Clarified goals of care, Provided psychosocial or spiritual support, Counseled regarding hospice      Time In: 0930, 1400 Time Out: 3926, 1430 Time Total: 117mn Greater than 50%  of this time was spent counseling and coordinating care related to the above assessment and plan.  Signed by:  MIhor Dow FNP-C Palliative Medicine Team  Phone: [phone]: [phone]: [phone]  Please contact Palliative Medicine Team phone at [phone] questions and concerns.  For individual provider: See AShea Evans

## 2016-10-30 ENCOUNTER — Ambulatory Visit: Payer: BLUE CROSS/BLUE SHIELD

## 2016-10-30 ENCOUNTER — Ambulatory Visit
Admission: RE | Admit: 2016-10-30 | Discharge: 2016-10-30 | Disposition: A | Discharge status: Home / Self Care | Payer: BLUE CROSS/BLUE SHIELD | Source: Ambulatory Visit | Attending: Radiation Oncology | Admitting: Radiation Oncology

## 2016-10-30 DIAGNOSIS — C50812 Malignant neoplasm of overlapping sites of left female breast: Secondary | ICD-10-CM | POA: Diagnosis not present

## 2016-10-30 DIAGNOSIS — B370 Candidal stomatitis: Secondary | ICD-10-CM

## 2016-10-30 LAB — BASIC METABOLIC PANEL
ANION GAP: 9 (ref 5–15)
BUN: 11 mg/dL (ref 6–20)
CALCIUM: 6 mg/dL — AB (ref 8.9–10.3)
CHLORIDE: 102 mmol/L (ref 101–111)
CO2: 21 mmol/L — AB (ref 22–32)
Creatinine, Ser: 0.33 mg/dL — ABNORMAL LOW (ref 0.44–1.00)
GFR calc non Af Amer: >60 mL/min (ref >60)
GLUCOSE: 125 mg/dL — AB (ref 65–99)
POTASSIUM: 4.1 mmol/L (ref 3.5–5.1)
Sodium: 132 mmol/L — ABNORMAL LOW (ref 135–145)

## 2016-10-30 LAB — ALBUMIN: Albumin: 3.1 g/dL — ABNORMAL LOW (ref 3.5–5.0)

## 2016-10-30 LAB — PHOSPHORUS: Phosphorus: 2 mg/dL — ABNORMAL LOW (ref 2.5–4.6)

## 2016-10-30 MED ORDER — SODIUM CHLORIDE 0.9 % IV SOLN
2.0000 g | Freq: Once | INTRAVENOUS | Status: AC
  Administered 2016-10-30: 2 g via INTRAVENOUS
  Filled 2016-10-30: qty 20

## 2016-10-30 MED ORDER — MORPHINE SULFATE (PF) 4 MG/ML IV SOLN
2.0000 mg | INTRAVENOUS | Status: DC | PRN
Start: 2016-10-30 — End: 2016-10-31
  Administered 2016-10-30 – 2016-10-31 (×9): 2 mg via INTRAVENOUS
  Filled 2016-10-30 (×9): qty 1

## 2016-10-30 MED ORDER — LORAZEPAM 2 MG/ML IJ SOLN
0.5000 mg | INTRAMUSCULAR | Status: DC | PRN
  Administered 2016-10-31: 0.5 mg via INTRAVENOUS
  Filled 2016-10-30: qty 1

## 2016-10-30 MED ORDER — FLUCONAZOLE IN SODIUM CHLORIDE 200-0.9 MG/100ML-% IV SOLN
200.0000 mg | Freq: Once | INTRAVENOUS | Status: AC
  Administered 2016-10-30: 22:00:00 200 mg via INTRAVENOUS
  Filled 2016-10-30: qty 100

## 2016-10-30 MED ORDER — POTASSIUM PHOSPHATES 15 MMOLE/5ML IV SOLN
15.0000 meq | Freq: Once | INTRAVENOUS | Status: AC
  Administered 2016-10-30: 18:00:00 15 meq via INTRAVENOUS
  Filled 2016-10-30: qty 3.41

## 2016-10-30 NOTE — Plan of Care (Signed)
Problem: Fluid Volume: Goal: Ability to maintain a balanced intake and output will improve Outcome: Not Progressing Pt still with poor prognosis. Morphine q 2 hrs as needed for pain. Scheduled doses of Zofran and Pepcid. Spouse at bedside.

## 2016-10-30 NOTE — Progress Notes (Signed)
[facility] NAME: Elizabeth Mccoy    MR#:  [id number]  DATE OF BIRTH:  Jun 15, 1956  SUBJECTIVE:  Patient is feeling nauseous, not feeling good. still complaining of blurry vision   CHIEF COMPLAINT:   Chief Complaint  Patient presents with  . Nausea  . Dizziness    REVIEW OF SYSTEMS:    Review of Systems  Constitutional: Positive for malaise/fatigue. Negative for chills and fever.  HENT: Negative for hearing loss and sore throat.   Eyes: Positive for blurred vision. Negative for double vision, photophobia and pain.  Respiratory: Negative for cough, hemoptysis, shortness of breath and wheezing.   Cardiovascular: Negative for chest pain, palpitations, orthopnea and leg swelling.  Gastrointestinal: Positive for nausea. Negative for abdominal pain, constipation, diarrhea, heartburn and vomiting.  Genitourinary: Negative for dysuria, hematuria and urgency.  Musculoskeletal: Negative for back pain, joint pain, myalgias and neck pain.  Skin: Negative for rash.  Neurological: Positive for dizziness, focal weakness and weakness. Negative for sensory change, speech change, seizures and headaches.  Endo/Heme/Allergies: Does not bruise/bleed easily.  Psychiatric/Behavioral: Negative for depression and memory loss. The patient is not nervous/anxious and does not have insomnia.     DRUG ALLERGIES:   Allergies  Allergen Reactions  . Beef Extract Anaphylaxis  . Pork (Porcine) Protein Anaphylaxis  . Beef-Derived Products Hives    VITALS:  Blood pressure (!) 161/87, pulse 69, temperature 98 F (36.7 C), temperature source Oral, resp. rate 16, height 5\' 1"  (1.549 m), weight 54.5 kg (120 lb 3.2 oz), SpO2 97 %.  PHYSICAL EXAMINATION:   Physical Exam  GENERAL:  60 y.o.-year-old patient lying in the bed with no acute distress. Appears cachectic. EYES: Pupils equal, round, reactive to light and accommodation. No scleral icterus. Extraocular  muscles intact.  HEENT: Head atraumatic, normocephalic. Oropharynx and nasopharynx clear.  NECK:  Supple, no jugular venous distention. No thyroid enlargement, no tenderness.  LUNGS: Normal breath sounds bilaterally, no wheezing, rales, rhonchi. No use of accessory muscles of respiration.  CARDIOVASCULAR: S1, S2 normal. No murmurs, rubs, or gallops.  ABDOMEN: Soft, nontender, nondistended. Bowel sounds present. No organomegaly or mass.  EXTREMITIES: No cyanosis, clubbing or edema b/l.    NEUROLOGIC: Patient is weak on right upper and lower extremities. PSYCHIATRIC: The patient is alert and oriented, SKIN: No obvious rash, lesion, or ulcer.   LABORATORY PANEL:   CBC  Recent Labs Lab 10/26/16 0356  WBC 14.3*  HGB 12.0  HCT 35.6  PLT 150   ------------------------------------------------------------------------------------------------------------------ Chemistries   Recent Labs Lab 10/26/16 0356  10/29/16 0528 10/30/16 0455  NA 130*  < > 129* 132*  K 3.9  < > 4.2 4.1  CL 101  < > 101 102  CO2 21*  < > 20* 21*  GLUCOSE 133*  < > 121* 125*  BUN 13  < > 12 11  CREATININE 0.37*  < > <0.30* 0.33*  CALCIUM 6.9*  < > 6.1* 6.0*  MG 2.4  < > 2.2  --   AST 85*  --   --   --   ALT 217*  --   --   --   ALKPHOS 164*  --   --   --   BILITOT 2.7*  --   --   --   < > = values in this interval not displayed. ------------------------------------------------------------------------------------------------------------------  Cardiac Enzymes No results for input(s): TROPONINI in the last 168 hours. ------------------------------------------------------------------------------------------------------------------  RADIOLOGY:  No  results found.   ASSESSMENT AND PLAN:   * Nausea, vomiting  With worsening hadache-secondary to metastatic breast cancer with brain metastases- possible worsening of intracranial pressure Now has aphasia  New onset visual impairment  Started on tube feeding,   But patient has pulled the NG tube , not a good cadidte for TPN continue aspiration precautions Continue Decadron 4 mg IV every 6 hours. Continue radiation for now as per oncology rcommendations  Appreciate oncology conult   * Elevated lipase No abdominal tenderness. Lipase has returned to normal. Possible pancreatitis but CT scan normal. Monitor.  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 On IV steroids.  Port placement is on hold because of decline in medical condition  patient's prognosis is really poor,  CODE STATUS changed to DO NOT RESUSCITATE by her husband patiet is agreeable Continue supportive care. Patient is thinking about comfort care but not ready yet  palliative care is meeting with the family members today at 3 PM   Aspiration pneumonia:  on Zosyn, continue strict aspiration precautions,  ,  Hyponatremia;: Continue IV fluids. Sodium is at 132 today   D/w husband,RN. Patient is concerned and really worried about taking care of her son with Rella Larve  All the records are reviewed and case discussed with Care Management/Social Workerr. Management plans discussed with the patient, family and they are in agreement.  CODE STATUS: FULL CODE  DVT Prophylaxis: SCDs  TOTAL TIME TAKING CARE OF THIS PATIENT: 33 minutes.   POSSIBLE D/C IN 1-2 DAYS, DEPENDING ON CLINICAL CONDITION.  Nicholes Mango M.D on 10/30/2016 at 4:40 PM  Between 7am to 6pm - Pager - [phone]  After 6pm go to [email or web address] - password [email or web address] [facility]  Office  [phone]  CC: Primary care physician; Gerilyn Pilgrim, FNP  Note: This dictation was prepared with Dragon dictation along with smaller phrase technology. Any transcriptional errors that result from this process are unintentional.

## 2016-10-30 NOTE — Progress Notes (Signed)
[facility] for electrolyte management  Indication: hypophosphatemia   Pharmacy consulted for electrolyte management for 60 yo female severe malnutrition in setting of  Metastatic breast cancer. Patient does not meet recommended requirements for receiving TPN and is having a NG tube placed   No NGT placed at this time and patient is not receiving feeding supplements. Not able to take oral meds. Calcium 6.0, corrected Ca: 6.72, Phos 2.0   Plan:   Calcium gluconate 2gm IV x 1 Potassium phosphate 36mmol IV x 1 Recheck electrolytes with AM labs   Allergies  Allergen Reactions  . Beef Extract Anaphylaxis  . Pork (Porcine) Protein Anaphylaxis  . Beef-Derived Products Hives    Patient Measurements: Height: 5\' 1"  (154.9 cm) Weight: 120 lb 3.2 oz (54.5 kg) IBW/kg (Calculated) : 47.8   Vital Signs: Temp: 98 F (36.7 C) (11/21 0453) Temp Source: Oral (11/21 0453) BP: 161/87 (11/21 0453) Pulse Rate: 69 (11/21 0453) Intake/Output from previous day: 11/20 0701 - 11/21 0700 In: 1926.8 [I.V.:1200.8; IV Piggyback:726] Out: 1800 [Urine:1800] Intake/Output from this shift: No intake/output data recorded.  Labs:  Recent Labs  10/28/16 0548 10/29/16 0528 10/30/16 0455  CREATININE 0.36* <0.30* 0.33*  MG 2.2 2.2  --   PHOS 1.5* 1.9* 2.0*  ALBUMIN  --   --  3.1*   Estimated Creatinine Clearance: 56.4 mL/min (by C-G formula based on SCr of 0.33 mg/dL (L)).   Pharmacy will continue to monitor and adjust per consult.    Cheri Guppy, PharmD 10/30/2016,10:29 AM

## 2016-10-30 NOTE — Progress Notes (Signed)
Pharmacy Antibiotic Note  Elizabeth Mccoy is a 60 y.o. female admitted on 10/21/2016 with intractable nausea and vomiting secondary to metastatic breast cancer.  Pharmacy has been consulted to dose zosyn for aspiration pneumonia.  Day 5 of antibiotics  Plan: Will start Zosyn 3.375 g IVq8 hours.    Height: 5\' 1"  (154.9 cm) Weight: 120 lb 3.2 oz (54.5 kg) IBW/kg (Calculated) : 47.8  Temp (24hrs), Avg:98 F (36.7 C), Min:97.9 F (36.6 C), Max:98 F (36.7 C)   Recent Labs Lab 10/26/16 0356 10/27/16 0541 10/28/16 0548 10/29/16 0528 10/30/16 0455  WBC 14.3*  --   --   --   --   CREATININE 0.37* 0.31* 0.36* <0.30* 0.33*    Estimated Creatinine Clearance: 56.4 mL/min (by C-G formula based on SCr of 0.33 mg/dL (L)).    Allergies  Allergen Reactions  . Beef Extract Anaphylaxis  . Pork (Porcine) Protein Anaphylaxis  . Beef-Derived Products Hives    Antimicrobials this admission:   >>   >>  Dose adjustments this admission:   Microbiology results:   Thank you for allowing pharmacy to be a part of this patient's care.  Merina Behrendt C 10/30/2016 9:33 AM

## 2016-10-30 NOTE — Progress Notes (Signed)
Daily Progress Note   Patient Name: Elizabeth Mccoy       Date: 10/30/2016 DOB: 1956/12/08  Age: 60 y.o. MRN#: [id number] Attending Physician: Elizabeth Mango, MD Primary Care Physician: Elizabeth Pilgrim, FNP Admit Date: 10/21/2016  Reason for Consultation/Follow-up: Establishing goals of care  Subjective: Elizabeth Lessen, NP and I first spoke with the patient this morning. Patient very withdrawn with short answers, often stating "I don't know." Discussed her rapidly progressive cancer and that time is short. She seems to understand this but is most worried about leaving her son when she dies. Her son, Elizabeth Mccoy, has downs syndrome and she has cared for him for 21 years. When asked what she wants for herself at this time in her life, she states "I don't know." She is ok with [place] talking with her family later.   After this, met with husband. He again expresses his understanding of her disease progression and poor prognosis. He is waiting for the rest of the family to understand so he doesn't look like he is "trying to kill her off." He tells [place] that his wife has always been a hardworking, busy, and stubborn individual and never took care of herself because she was constantly caring for others. He tells [place] this is killing her lying in the bed all day. They never did discuss what she wanted for herself if her cancer progressed.   This afternoon, met with daughter, Elizabeth Mccoy. Elizabeth Mccoy tells [place] she understands this is the end and she has seen her mother declining the past two weeks with no appetite. Discussed her rapid decline and that her cognition is expected to decline in the next few days. Knowing this is terminal and irreversible, Elizabeth Mccoy states she wants her mother to be comfortable and not suffer. She has  come to terms that her mother is not starving but this is the natural process towards the end-of-life. Discussed interventions such as IVF and radiation that are prolonging the dying process and may be more harm than good to her mother at this point. She understands but tells [place] she asked her mother last night if she was ready to go home and she said "not yet" so Elizabeth Mccoy is not completely ready to stop all interventions, such as the IVF. The patient highly values recommendations from Dr. Rogue Mccoy, and Elizabeth Mccoy would  like for him to talk to her mother about stopping radiation.   Discussed EOL expectations, symptom management, and hospice. Two weeks ago, Ms. Mccoy told Elizabeth Mccoy to "never send me to hospice." Discussed the possibility of family taking her home but that she will require a high level of nursing care, especially with frequent pain medication. Discussed the possibility of even starting a continuous infusion if the pain is not managed. Elizabeth Mccoy seems to understand a hospice home would be the best place for her at this time in her life but still trying to honor her mother's wishes.   Answered all questions. Hard Choices and Gone from my Sight books given. Spiritual and emotional support given.   Length of Stay: 9  Current Medications: Scheduled Meds:  . dexamethasone  4 mg Intravenous Q6H  . dronabinol  5 mg Oral BID AC  . famotidine (PEPCID) IV  20 mg Intravenous Q12H  . feeding supplement (ENSURE ENLIVE)  237 mL Oral BID BM  . feeding supplement (OSMOLITE 1.5 CAL)  210 mL Per Tube QID  . feeding supplement (PRO-STAT SUGAR FREE 64)  30 mL Per Tube TID  . fluconazole (DIFLUCAN) IV  200 mg Intravenous Once  . haloperidol lactate  2.5 mg Intramuscular Once  . multivitamin  15 mL Per Tube Daily  . nystatin  5 mL Oral QID  . ondansetron (ZOFRAN) IV  8 mg Intravenous Q6H  . piperacillin-tazobactam (ZOSYN)  IV  3.375 g Intravenous Q8H  . polyethylene glycol  17 g Oral Daily  .  potassium phosphate IVPB (mEq)  15 mEq Intravenous Once  . senna-docusate  1 tablet Oral BID    Continuous Infusions: . sodium chloride 50 mL/hr at 10/30/16 0448    PRN Meds: acetaminophen, morphine injection, ondansetron (ZOFRAN) IV, oxyCODONE, promethazine  Physical Exam  Constitutional: She is oriented to person, place, and time. She appears ill.  HENT:  thrush  Eyes:  blindness  Cardiovascular: Regular rhythm and normal heart sounds.   Pulmonary/Chest: She has decreased breath sounds.  Abdominal: Soft.  Neurological: She is alert and oriented to person, place, and time.  forgetful  Skin: Skin is warm and dry. There is pallor.  Psychiatric: Her speech is normal. She is withdrawn.  Nursing note and vitals reviewed.          Vital Signs: BP (!) 161/87 (BP Location: Left Arm)   Pulse 69   Temp 98 F (36.7 C) (Oral)   Resp 16   Ht _0  (1.549 m)   Wt 54.5 kg (120 lb 3.2 oz)   LMP  (Approximate)   SpO2 97%   BMI 22.71 kg/m  SpO2: SpO2: 97 % O2 Device: O2 Device: Not Delivered O2 Flow Rate:    Intake/output summary:   Intake/Output Summary (Last 24 hours) at 10/30/16 1700 Last data filed at 10/30/16 0656  Gross per 24 hour  Intake          1458.83 ml  Output             1800 ml  Net          -341.17 ml   LBM: Last BM Date: 10/24/16 Baseline Weight: Weight: 55.8 kg (123 lb) Most recent weight: Weight: 54.5 kg (120 lb 3.2 oz)       Palliative Assessment/Data: PPS 10%   Flowsheet Rows   Flowsheet Row Most Recent Value  Intake Tab  Referral Department  Oncology  Unit at Time of Referral  Oncology Unit  Palliative  Care Primary Diagnosis  Cancer  Date Notified  10/26/16  Palliative Care Type  New Palliative care  Reason for referral  Clarify Goals of Care  Date of Admission  10/21/16  Date first seen by Palliative Care  10/29/16  # of days IP prior to Palliative referral  5  Clinical Assessment  Palliative Performance Scale Score  10%  Psychosocial &  Spiritual Assessment  Palliative Care Outcomes  Patient/Family meeting held?  Yes  Who was at the meeting?  husband in AM, daughter this afternoon  Palliative Care Outcomes  Clarified goals of care, Provided end of life care assistance, Provided psychosocial or spiritual support, Counseled regarding hospice      Patient Active Problem List   Diagnosis Date Noted  . Palliative care encounter   . Encounter for hospice care discussion   . Protein-calorie malnutrition, severe 10/23/2016  . Intractable nausea and vomiting 10/21/2016  . Secondary malignant neoplasm of brain and spinal cord ([place] [place]) 10/12/2016  . Carcinoma of overlapping sites of right breast in female, estrogen receptor positive ([place]) 10/10/2016  . Pancytopenia, acquired ([place]) 09/05/2016  . Chest wall discomfort 09/05/2016  . Metastases to the liver ([place]) 09/05/2016  . Metastatic cancer ([place]) 06/27/2016  . Malignant neoplasm of overlapping sites of right breast ([place]) 06/15/2016  . Menopause     Palliative Care Assessment & Plan   Patient Profile: 60 y.o. female  with past medical history of breast cancer with metastases to liver, bone, and brain including leptomeningeal metastasis admitted on 10/21/2016 with poor PO intake, dehydration, nausea, and weakness. Worsening headache and nausea from leptomeningeal mets with new onset blindness. Patient receiving steroids and radiation. Also with elevated LFT's likely due to disease progression per Dr. Rogue Mccoy. Poor nutrition-patient is not a candidate for TPN and NGT is out. Very poor prognosis. Currently receiving radiation. Palliative medicine consultation for goals of care.   Assessment: Breast cancer with liver, bone, and brain metastases Leptomeningeal metastasis Pain Nausea Thrush  Recommendations/Plan:  PMT will continue conversations with family on 11/22. Not ready for full comfort measures yet but husband and daughter understand she is at the end-of-life and want  her to be comfortable. Sister, Janace Hoard, requires continued conversations and education on EOL.   Updated Dr. Rogue Mccoy on this conversation. He plans to follow-up tomorrow to discuss stopping radiation with the patient.   Ordered Diflucan 244m IVPB x1 for thrush.   Morphine 218mIV q1h prn pain/dyspnea.   Code Status: DNR   Code Status Orders        Start     Ordered   10/27/16 1110  Do not attempt resuscitation (DNR)  Continuous    Question Answer Comment  In the event of cardiac or respiratory ARREST Do not call a "code blue"   In the event of cardiac or respiratory ARREST Do not perform Intubation, CPR, defibrillation or ACLS   In the event of cardiac or respiratory ARREST Use medication by any route, position, wound care, and other measures to relive pain and suffering. May use oxygen, suction and manual treatment of airway obstruction as needed for comfort.   Comments NURSE MAY PRONOUNCE.      10/27/16 1110    Code Status History    Date Active Date Inactive Code Status Order ID Comments User Context   10/27/2016 11:01 AM 10/27/2016 11:10 AM DNR [id number] HugerMD Inpatient   10/21/2016  8:03 PM 10/27/2016 11:01 AM Full Code [id number] HireMD  Inpatient       Prognosis:   < 2 weeks likely days to weeks. Metastatic breast cancer to bone, liver, and leptomeningeal involvement. Severe nutrition and functional status decline.   Discharge Planning:  To Be Determined residential hospice vs. Hospital death   Care plan was discussed with patient, daughter, husband, RN, and Dr. Rogue Mccoy  Thank you for allowing the Palliative Medicine Team to assist in the care of this patient.   Time In: 0930, 1515 Time Out: 1000, 1615 Total Time 53mn Prolonged Time Billed  yes       Greater than 50%  of this time was spent counseling and coordinating care related to the above assessment and plan.  MIhor Dow FNP-C Palliative Medicine Team  Phone:  [phone]: [phone]: [phone] Please contact Palliative Medicine Team phone at [phone] questions and concerns.

## 2016-10-30 NOTE — Plan of Care (Signed)
Problem: Pain Managment: Goal: General experience of comfort will improve Outcome: Progressing Morphine 2 mg IV given during shift. Improvement noted.  Problem: Fluid Volume: Goal: Ability to maintain a balanced intake and output will improve Outcome: Progressing Remains on IVF's.  Foley catheter intact.

## 2016-10-30 NOTE — Progress Notes (Signed)
CH made a follow up visit. Pt was resting. Spoke with Sister-in-Law who was bedside. Not much has changed in the last day. Spoke briefly with husband and told him I had left a copy of an AD with the daughter. Husband declined education on the AD at this point. Prayer was offered at doorway. CH is available for follow up as needed.   10/30/16 1300  Clinical Encounter Type  Visited With Patient;Patient and family together  Visit Type Follow-up;Spiritual support  Spiritual Encounters  Spiritual Needs Prayer;Grief support  Stress Factors  Patient Stress Factors Exhausted;Health changes;Major life changes  Family Stress Factors Major life changes

## 2016-10-31 ENCOUNTER — Ambulatory Visit: Payer: BLUE CROSS/BLUE SHIELD

## 2016-10-31 DIAGNOSIS — Z923 Personal history of irradiation: Secondary | ICD-10-CM

## 2016-10-31 DIAGNOSIS — H47619 Cortical blindness, unspecified side of brain: Secondary | ICD-10-CM

## 2016-10-31 DIAGNOSIS — Z515 Encounter for palliative care: Secondary | ICD-10-CM

## 2016-10-31 DIAGNOSIS — G893 Neoplasm related pain (acute) (chronic): Secondary | ICD-10-CM

## 2016-10-31 DIAGNOSIS — R0600 Dyspnea, unspecified: Secondary | ICD-10-CM

## 2016-10-31 LAB — BASIC METABOLIC PANEL
Anion gap: 8 (ref 5–15)
BUN: 9 mg/dL (ref 6–20)
CHLORIDE: 97 mmol/L — AB (ref 101–111)
CO2: 21 mmol/L — AB (ref 22–32)
Calcium: 6.2 mg/dL — CL (ref 8.9–10.3)
Glucose, Bld: 131 mg/dL — ABNORMAL HIGH (ref 65–99)
Potassium: 4.6 mmol/L (ref 3.5–5.1)
Sodium: 126 mmol/L — ABNORMAL LOW (ref 135–145)

## 2016-10-31 LAB — MAGNESIUM: Magnesium: 1.9 mg/dL (ref 1.7–2.4)

## 2016-10-31 LAB — PHOSPHORUS: Phosphorus: 1.5 mg/dL — ABNORMAL LOW (ref 2.5–4.6)

## 2016-10-31 MED ORDER — GLYCOPYRROLATE 0.2 MG/ML IJ SOLN
0.2000 mg | INTRAMUSCULAR | Status: DC | PRN
  Administered 2016-11-02: 0.2 mg via INTRAVENOUS
  Filled 2016-10-31: qty 1

## 2016-10-31 MED ORDER — SODIUM PHOSPHATES 45 MMOLE/15ML IV SOLN
20.0000 mmol | Freq: Once | INTRAVENOUS | Status: DC
  Filled 2016-10-31: qty 6.67

## 2016-10-31 MED ORDER — GLYCOPYRROLATE 0.2 MG/ML IJ SOLN
0.1000 mg | INTRAMUSCULAR | Status: DC
  Administered 2016-10-31 (×2): 0.1 mg via INTRAVENOUS
  Filled 2016-10-31 (×2): qty 1

## 2016-10-31 MED ORDER — GLYCOPYRROLATE 1 MG PO TABS: 1.0000 mg | ORAL_TABLET | Freq: Three times a day (TID) | ORAL | Status: DC

## 2016-10-31 MED ORDER — MORPHINE BOLUS VIA INFUSION
0.5000 mg | INTRAVENOUS | Status: DC
  Filled 2016-10-31 (×25): qty 1

## 2016-10-31 MED ORDER — SODIUM CHLORIDE 0.9 % IV SOLN
2.0000 g | Freq: Once | INTRAVENOUS | Status: DC
  Filled 2016-10-31: qty 20

## 2016-10-31 MED ORDER — LORAZEPAM 2 MG/ML IJ SOLN
1.0000 mg | INTRAMUSCULAR | Status: DC | PRN
  Administered 2016-10-31 – 2016-11-04 (×4): 1 mg via INTRAVENOUS
  Filled 2016-10-31 (×4): qty 1

## 2016-10-31 MED ORDER — MORPHINE 100MG IN NS 100ML (1MG/ML) PREMIX INFUSION
7.0000 mg/h | INTRAVENOUS | Status: DC
  Administered 2016-10-31: 1 mg/h via INTRAVENOUS
  Administered 2016-11-02: 3 mg/h via INTRAVENOUS
  Administered 2016-11-03: 7 mg/h via INTRAVENOUS
  Administered 2016-11-03: 5 mg/h via INTRAVENOUS
  Administered 2016-11-04 – 2016-11-05 (×3): 7 mg/h via INTRAVENOUS
  Filled 2016-10-31 (×7): qty 100

## 2016-10-31 MED ORDER — MORPHINE BOLUS VIA INFUSION
0.5000 mg | INTRAVENOUS | Status: DC | PRN
  Administered 2016-10-31 – 2016-11-03 (×10): 0.5 mg via INTRAVENOUS
  Filled 2016-10-31: qty 1

## 2016-10-31 NOTE — Plan of Care (Signed)
Problem: Pain Managment: Goal: General experience of comfort will improve Outcome: Progressing Ativan added to medication regimen prn. Had to get dose amount and frequency adjusted from 0.5 mg q 4 hrs to 1 mg q 2 hrs prn. Robinul added to decrease secretions. Morphine given 2 mg frequently throughout the night. Discussed with husband Merry Proud that a Morphine drip may be more therapeutic in managing patient pain and discomfort during her end of life. Merry Proud verbalized that he thought that would be a good idea. Pt turned and repositioned for comfort. Oral care provided when patient would allow.   Problem: Activity: Goal: Risk for activity intolerance will decrease Outcome: Not Applicable Date Met: 09/81/19 Pt on bedrest. Unable to tolerate HOB at any elevation. Oral suctioned prn.  Problem: Fluid Volume: Goal: Ability to maintain a balanced intake and output will improve Outcome: Not Progressing Soft diet but high aspiration risk and pt not consuming anything po at this time. NGT not present. MD aware. Indwelling foley in place. Placed for urinary retention now pt transitioning to end of life care. Palliative care involved.

## 2016-10-31 NOTE — Progress Notes (Signed)
[place] NOTE  Patient Care Team: Gerilyn Pilgrim, FNP as PCP - General (Nurse Practitioner)  CHIEF COMPLAINTS/PURPOSE OF CONSULTATION:  Metastatic breast cancer/ patient unable to provide any history because of mental status changes/morphine medication  CC: History is provided by patient's husband at the bedside. Patient had "rough night" ;  should be needing morphine 2 mg almost every 2 hours.   ROS: Unable to assess  MEDICAL HISTORY:  Past Medical History:  Diagnosis Date  . Breast cancer, right ([place]) 2015   rad tx's on right hip.  . Menopause 12/10/2006   2008    SURGICAL HISTORY: Past Surgical History:  Procedure Laterality Date  . CESAREAN SECTION      SOCIAL HISTORY: Social History   Social History  . Marital status: Married    Spouse name: N/A  . Number of children: 2  . Years of education: N/A   Occupational History  . Not on file.   Social History Main Topics  . Smoking status: Never Smoker  . Smokeless tobacco: Never Used  . Alcohol use No  . Drug use: No  . Sexual activity: Not on file   Other Topics Concern  . Not on file   Social History Narrative  . No narrative on file    FAMILY HISTORY: Family History  Problem Relation Age of Onset  . Cancer Father     prostate ca  . Cancer Sister     cancer unknown primary    ALLERGIES:  is allergic to beef extract; pork (porcine) protein; and beef-derived products.  MEDICATIONS:  Current Facility-Administered Medications  Medication Dose Route Frequency Provider Last Rate Last Dose  . 0.9 %  sodium chloride infusion   Intravenous Continuous Epifanio Lesches, MD 50 mL/hr at 10/31/16 0006    . acetaminophen (TYLENOL) tablet 650 mg  650 mg Oral Q6H PRN Hillary Bow, MD   650 mg at 10/23/16 2138  . calcium gluconate 2 g in sodium chloride 0.9 % 250 mL IVPB  2 g Intravenous Once Loree Fee, RPH      . dexamethasone (DECADRON) injection 4 mg  4 mg Intravenous Q6H Baxter Hire, MD   4 mg at 10/31/16 0548  . dronabinol (MARINOL) capsule 5 mg  5 mg Oral BID AC Baxter Hire, MD   5 mg at 10/26/16 [phone]  . famotidine (PEPCID) IVPB 20 mg premix  20 mg Intravenous Q12H Epifanio Lesches, MD   20 mg at 10/30/16 2233  . feeding supplement (ENSURE ENLIVE) (ENSURE ENLIVE) liquid 237 mL  237 mL Oral BID BM Srikar Sudini, MD      . feeding supplement (OSMOLITE 1.5 CAL) liquid 210 mL  210 mL Per Tube QID Epifanio Lesches, MD   210 mL at 10/27/16 1451  . feeding supplement (PRO-STAT SUGAR FREE 64) liquid 30 mL  30 mL Per Tube TID Epifanio Lesches, MD      . glycopyrrolate (ROBINUL) injection 0.1 mg  0.1 mg Intravenous Q4H Harrie Foreman, MD   0.1 mg at 10/31/16 0744  . haloperidol lactate (HALDOL) injection 2.5 mg  2.5 mg Intramuscular Once Epifanio Lesches, MD      . LORazepam (ATIVAN) injection 1 mg  1 mg Intravenous Q2H PRN Harrie Foreman, MD   1 mg at 10/31/16 0343  . morphine 4 MG/ML injection 2 mg  2 mg Intravenous Q1H PRN Basilio Cairo, NP   2 mg at 10/31/16 0743  . multivitamin liquid  15 mL  15 mL Per Tube Daily Epifanio Lesches, MD      . nystatin (MYCOSTATIN) 100000 UNIT/ML suspension 500,000 Units  5 mL Oral QID Cammie Sickle, MD   500,000 Units at 10/30/16 2233  . ondansetron (ZOFRAN) 8 mg in sodium chloride 0.9 % 50 mL IVPB  8 mg Intravenous Q6H Baxter Hire, MD   8 mg at 10/31/16 0203  . ondansetron (ZOFRAN) injection 4 mg  4 mg Intravenous Q6H PRN Hillary Bow, MD   4 mg at 10/29/16 0153  . oxyCODONE (Oxy IR/ROXICODONE) immediate release tablet 5 mg  5 mg Oral Q4H PRN Hillary Bow, MD   5 mg at 10/22/16 2117  . piperacillin-tazobactam (ZOSYN) IVPB 3.375 g  3.375 g Intravenous Q8H Epifanio Lesches, MD   3.375 g at 10/31/16 0103  . polyethylene glycol (MIRALAX / GLYCOLAX) packet 17 g  17 g Oral Daily Epifanio Lesches, MD      . promethazine (PHENERGAN) injection 25 mg  25 mg Intravenous Q6H PRN Epifanio Lesches, MD   25  mg at 10/25/16 1308  . senna-docusate (Senokot-S) tablet 1 tablet  1 tablet Oral BID Epifanio Lesches, MD   1 tablet at 10/26/16 0842  . sodium phosphate 20 mmol in dextrose 5 % 250 mL infusion  20 mmol Intravenous Once Loree Fee, RPH          .  PHYSICAL EXAMINATION:  Vitals:   10/30/16 2016 10/31/16 0445  BP: (!) 159/76 (!) 152/91  Pulse: 77 86  Resp: 16 16  Temp: 98.1 F (36.7 C) 97.6 F (36.4 C)   Filed Weights   10/29/16 0500 10/30/16 0453 10/31/16 0500  Weight: 119 lb (54 kg) 120 lb 3.2 oz (54.5 kg) 123 lb 1.6 oz (55.8 kg)    GENERAL: Moderate built moderately nourished female patient resting in the bed. Moaning.  Accompanied by her husband.  EYES: no pallor or icterus OROPHARYNX: Cannot be examined felt LUNGS: decreased breath sounds to auscultation at bases and  No wheeze or crackles HEART/CVS: regular rate & rhythm and no murmurs; No lower extremity edema ABDOMEN: abdomen soft, non-tender and normal bowel sounds Musculoskeletal:no cyanosis of digits and no clubbing  PSYCH:  drowsy ; not arousable  NEURO:  cannot be performed  SKIN:  no rashes or significant lesions  LABORATORY DATA:  I have reviewed the data as listed Lab Results  Component Value Date   WBC 14.3 (H) 10/26/2016   HGB 12.0 10/26/2016   HCT 35.6 10/26/2016   MCV 95.2 10/26/2016   PLT 150 10/26/2016    Recent Labs  10/21/16 1458  10/23/16 0457 10/26/16 0356  10/29/16 0528 10/30/16 0455 10/31/16 0543  NA 127*  < > 133* 130*  < > 129* 132* 126*  K 4.4  < > 4.4 3.9  < > 4.2 4.1 4.6  CL 97*  < > 104 101  < > 101 102 97*  CO2 19*  < > 21* 21*  < > 20* 21* 21*  GLUCOSE 117*  < > 135* 133*  < > 121* 125* 131*  BUN 17  < > 12 13  < > 12 11 9   CREATININE 0.48  < > 0.40* 0.37*  < > <0.30* 0.33* <0.30*  CALCIUM 7.8*  < > 7.4* 6.9*  < > 6.1* 6.0* 6.2*  GFRNONAA >60  < > >60 >60  < > NOT CALCULATED >60 NOT CALCULATED  GFRAA >60  < > >60 >60  < >  NOT CALCULATED >60 NOT CALCULATED  PROT  7.1  --  6.3* 6.3*  --   --   --   --   ALBUMIN 4.2  --  3.7 3.5  --   --  3.1*  --   AST 54*  --  55* 85*  --   --   --   --   ALT 64*  --  81* 217*  --   --   --   --   ALKPHOS 229*  --  164* 164*  --   --   --   --   BILITOT 1.0  --  0.8 2.7*  --   --   --   --   BILIDIR  --   --   --  1.4*  --   --   --   --   IBILI  --   --   --  1.3*  --   --   --   --   < > = values in this interval not displayed.  RADIOGRAPHIC STUDIES: I have personally reviewed the radiological images as listed and agreed with the findings in the report. Ct Head Wo Contrast  Result Date: 10/21/2016 CLINICAL DATA:  Metastatic breast cancer. EXAM: CT HEAD WITHOUT CONTRAST TECHNIQUE: Contiguous axial images were obtained from the base of the skull through the vertex without intravenous contrast. COMPARISON:  MRI head 10/12/2016 FINDINGS: Brain: Progressive ventricular dilatation since the prior MRI. This is most consistent with hydrocephalus in this patient with metastatic disease and may be due to leptomeningeal tumor. No obstructing mass lesion. Multiple small metastatic deposits are seen throughout the brain on the MRI. These are not well seen on unenhanced CT however there is mild edema in the right occipital lobe and in the right frontal lobe over the convexity consistent with metastatic disease. Negative for hemorrhage. Vascular: Negative for dense MCA Skull: Patchy sclerotic disease in the clivus compatible with metastatic disease. No destructive skull lesion. Sinuses/Orbits: Infiltration of the orbit with enlargement of the extra-ocular muscles most consistent with metastatic disease to the orbit as identified on MRI. Other: None IMPRESSION: Progressive ventricular dilatation since the recent MRI compatible with hydrocephalus. 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 Multiple brain metastasis better seen on MRI. No large obstructing mass lesion is identified. Negative for hemorrhage  Metastatic disease to the clivus. Metastatic disease to the orbit bilaterally. Electronically Signed   By: Franchot Gallo M.D.   On: 10/21/2016 17:22   Ct Chest W Contrast  Result Date: 10/11/2016 CLINICAL DATA:  Metastatic breast cancer. EXAM: CT CHEST, ABDOMEN, AND PELVIS WITH CONTRAST TECHNIQUE: Multidetector CT imaging of the chest, abdomen and pelvis was performed following the standard protocol during bolus administration of intravenous contrast. CONTRAST:  157mL ISOVUE-300 IOPAMIDOL (ISOVUE-300) INJECTION 61% COMPARISON:  08/29/2016 FINDINGS: CT CHEST FINDINGS Cardiovascular: The heart size appears normal. Aortic atherosclerosis noted. Mediastinum/Nodes: The trachea appears patent and is midline. Normal appearance of the esophagus. Right axillary node measures 9 mm, image number 18 of series 2. Previously this measured the same. Interval decrease in soft tissue stranding within the right axilla which may be related to prior instrumentation. No enlarged mediastinal or hilar nodes. Lungs/Pleura: Small right pleural effusion. Mild diffuse bronchial wall thickening noted. Left upper lobe pulmonary nodule is unchanged measuring 4 mm, image 56 of series 4. Perifissural nodule in the left lower lobe is stable measuring 4 mm, image number 80 of series  4. No new or enlarging pulmonary nodules identified Musculoskeletal: Extensive, widespread sclerotic bone metastases again noted. Subjectively increased to previous exam. CT ABDOMEN PELVIS FINDINGS Hepatobiliary: Multifocal liver metastases again identified. Index lesion within the right lobe measures 2.6 cm, image 41 of series 2. Previously this measured the same. Lateral segment of left lobe of liver lesion measures 1.5 cm, image 49 of series 2. Previously 2.1 cm. The index lesion within the lateral right lobe of liver measures 2 cm, image 51 of series 2. Previously 2.3 cm. Index lesion within the inferior right lobe measures 2.4 cm, image 59 of series 2.  Previously 2.6 cm. Gallbladder appears normal. No biliary dilatation. Pancreas: Normal appearance of the pancreas. Spleen: No change in small low density structure within the spleen measuring 6 mm, image 51 of series 2. Adrenals/Urinary Tract: The adrenal glands are normal. There is persistent bilateral pelvocaliectasis. No hydroureter. Enhancing nodule along the anterior wall of the bladder is again noted, image 81 89 of series 6. Stomach/Bowel: The stomach is normal. The small bowel loops have a normal caliber. No bowel obstruction. No pathologic dilatation of the colon. Increase scratch set there is mild thickening of the appendix which measures 8 mm in diameter, image 77 of series 2. No free fluid or fluid collections noted within in the right lower quadrant. Vascular/Lymphatic: Normal appearance of the abdominal aorta. No enlarged upper abdominal lymph nodes. There is no pelvic or inguinal adenopathy. Reproductive: The uterus appears unremarkable. The right ovary measures 2.8 x 2.0 cm, image 89 of series 2. Previously 2.8 x 1.5 cm. Other: There is a small amount of free fluid identified within the pelvis. This is new from previous exam. Additionally, there is a suggestion subtle soft tissue nodularity within the peritoneal fat. For example, image number 64 of series 2 Musculoskeletal: Diffuse all osseous sclerosis compatible secondary to metastatic disease is again identified. IMPRESSION: 1. Index liver lesions are slightly decreased in size from previous exam. 2. Widespread sclerotic bone metastasis as noted previously. Subjectively there may be an increase in the sclerosis which could reflect healing of bone metastases or progression of sclerotic bone metastases. 3. New ascites identified within the pelvis. Subtle areas of soft tissue nodularity within the peritoneum is concerning for development of peritoneal metastases. 4. Slight enlargement of the right ovary. Attention to the right ovary on follow-up  imaging to monitor for potential ovarian metastases. Electronically Signed   By: Kerby Moors M.D.   On: 10/11/2016 11:59   Mr Jeri Cos [id number] Contrast  Result Date: 10/12/2016 CLINICAL DATA:  60 year old female with metastatic breast cancer. Staging. Subsequent encounter. EXAM: MRI HEAD WITHOUT AND WITH CONTRAST TECHNIQUE: Multiplanar, multiecho pulse sequences of the brain and surrounding structures were obtained without and with intravenous contrast. CONTRAST:  90mL MULTIHANCE GADOBENATE DIMEGLUMINE 529 MG/ML IV SOLN COMPARISON:  PET-CT 11/14/2015. FINDINGS: Brain: Numerous small brain metastases. There are at least 15 small supratentorial metastases ranging from punctate to 10 mm diameter. There is also extensive metastatic disease affecting the cerebellum with widespread small areas of abnormal enhancement, many of which are suspicious for leptomeningeal disease (series 16 images 14 and 15). The largest individual cerebellar lesions are 8-9 mm. Despite the extensive disease there is only occasional mild cerebral and cerebellar edema, and no significant intracranial mass effect. No hemorrhagic metastases. No abnormal ependymoma 1 enhancement. No pachymeningeal thickening or enhancement. No restricted diffusion or evidence of acute infarction. No ventriculomegaly. No acute intracranial hemorrhage identified. Negative pituitary. Negative cervicomedullary junction. No  chronic cerebral blood products identified. There are small chronic lacunar infarcts in the left cerebellum. Subtle chronic cortical encephalomalacia in the right parietal lobe (series 9, image 21) compatible with previous small posterior right MCA infarct. Vascular: Major intracranial vascular flow voids are preserved, the distal left vertebral artery appears dominant. Skull and upper cervical spine: Diffuse abnormal bone marrow signal throughout the cervical spine and at the skullbase in keeping with widespread osseous metastatic disease. No  destructive calvarial lesion identified. Negative visualized cervical spinal cord. Sinuses/Orbits: Abnormal heterogeneous appearance of the bilateral intraorbital soft tissues including the right lateral rectus muscle which is expanded in T2 hyperintense (series 14, image 20 and series 9, image 9). Similar heterogeneity throughout the left orbital soft tissues. There seems to be a mild degree of enophthalmos. The globes appear remain intact. Trace paranasal sinus mucosal thickening. Other: Mastoids are clear. Visible internal auditory structures appear normal. Negative scalp soft tissues. IMPRESSION: 1. Widespread metastatic disease to the brain. At least 15 small supratentorial metastases ranging from punctate to 10 mm diameter, and extensive cerebellar metastatic disease which appears highly suspicious for leptomeningeal metastases. 2. Only mild scattered brain edema and no significant intracranial mass effect. 3. Bilateral orbit soft tissue metastases (scirrhous infiltration of the orbits). 4. Widespread osseous metastatic disease. Electronically Signed   By: Genevie Ann M.D.   On: 10/12/2016 13:18   Mr Thoracic Spine W Wo Contrast  Result Date: 10/23/2016 CLINICAL DATA:  Metastatic breast cancer. EXAM: MRI THORACIC AND LUMBAR SPINE WITHOUT AND WITH CONTRAST TECHNIQUE: Multiplanar and multiecho pulse sequences of the thoracic and lumbar spine were obtained without and with intravenous contrast. CONTRAST:  25mL MULTIHANCE GADOBENATE DIMEGLUMINE 529 MG/ML IV SOLN COMPARISON:  Bone scan 08/29/2016 FINDINGS: MRI THORACIC SPINE FINDINGS Alignment:  Physiologic. Vertebrae: Heterogeneous marrow diffusely from extensive metastatic disease. Every thoracic vertebra is involved. No extraosseous tumor growth is noted in the epidural or foraminal spaces. No pathologic fracture. Cord: The cord has normal signal, but there is diffuse surface enhancement of the cord and intrathecal roots. Paraspinal and other soft tissues:  Known hepatic metastatic disease. Trace right pleural effusion. Disc levels: No degenerative impingement MRI LUMBAR SPINE FINDINGS Segmentation:  Standard. Alignment:  Physiologic. Vertebrae: Metastases throughout all lumbar levels. No pathologic fracture or epidural/ foraminal infiltration. Conus medullaris: Extends to the T12 level and appears normal. There is diffuse thickening enhancement of the cauda equina Paraspinal and other soft tissues: The bladder is over distended and there is bilateral moderate hydroureteronephrosis. Disc levels: Lower lumbar facet arthropathy.  No degenerative impingement. These results will be called to the ordering clinician or representative by the Radiologist Assistant, and communication documented in the PACS or zVision Dashboard. IMPRESSION: 1. 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 2. Widespread osseous metastatic disease without pathologic fracture or detected epidural infiltration. 3. Over distended bladder with bilateral hydronephrosis, correlate for retention related to #1. 4. Known hepatic metastatic disease. Electronically Signed   By: Monte Fantasia M.D.   On: 10/23/2016 13:37   Mr Lumbar Spine W Wo Contrast  Result Date: 10/23/2016 CLINICAL DATA:  Metastatic breast cancer. EXAM: MRI THORACIC AND LUMBAR SPINE WITHOUT AND WITH CONTRAST TECHNIQUE: Multiplanar and multiecho pulse sequences of the thoracic and lumbar spine were obtained without and with intravenous contrast. CONTRAST:  4mL MULTIHANCE GADOBENATE DIMEGLUMINE 529 MG/ML IV SOLN COMPARISON:  Bone scan 08/29/2016 FINDINGS: MRI THORACIC SPINE FINDINGS Alignment:  Physiologic. Vertebrae: Heterogeneous marrow diffusely from extensive metastatic disease.  Every thoracic vertebra is involved. No extraosseous tumor growth is noted in the epidural or foraminal spaces. No pathologic fracture. Cord: The cord has normal signal, but there is  diffuse surface enhancement of the cord and intrathecal roots. Paraspinal and other soft tissues: Known hepatic metastatic disease. Trace right pleural effusion. Disc levels: No degenerative impingement MRI LUMBAR SPINE FINDINGS Segmentation:  Standard. Alignment:  Physiologic. Vertebrae: Metastases throughout all lumbar levels. No pathologic fracture or epidural/ foraminal infiltration. Conus medullaris: Extends to the T12 level and appears normal. There is diffuse thickening enhancement of the cauda equina Paraspinal and other soft tissues: The bladder is over distended and there is bilateral moderate hydroureteronephrosis. Disc levels: Lower lumbar facet arthropathy.  No degenerative impingement. These results will be called to the ordering clinician or representative by the Radiologist Assistant, and communication documented in the PACS or zVision Dashboard. IMPRESSION: 1. 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 2. Widespread osseous metastatic disease without pathologic fracture or detected epidural infiltration. 3. Over distended bladder with bilateral hydronephrosis, correlate for retention related to #1. 4. Known hepatic metastatic disease. Electronically Signed   By: Monte Fantasia M.D.   On: 10/23/2016 13:37   Ct Abdomen Pelvis W Contrast  Result Date: 10/11/2016 CLINICAL DATA:  Metastatic breast cancer. EXAM: CT CHEST, ABDOMEN, AND PELVIS WITH CONTRAST TECHNIQUE: Multidetector CT imaging of the chest, abdomen and pelvis was performed following the standard protocol during bolus administration of intravenous contrast. CONTRAST:  124mL ISOVUE-300 IOPAMIDOL (ISOVUE-300) INJECTION 61% COMPARISON:  08/29/2016 FINDINGS: CT CHEST FINDINGS Cardiovascular: The heart size appears normal. Aortic atherosclerosis noted. Mediastinum/Nodes: The trachea appears patent and is midline. Normal appearance of the esophagus. Right axillary node  measures 9 mm, image number 18 of series 2. Previously this measured the same. Interval decrease in soft tissue stranding within the right axilla which may be related to prior instrumentation. No enlarged mediastinal or hilar nodes. Lungs/Pleura: Small right pleural effusion. Mild diffuse bronchial wall thickening noted. Left upper lobe pulmonary nodule is unchanged measuring 4 mm, image 56 of series 4. Perifissural nodule in the left lower lobe is stable measuring 4 mm, image number 80 of series 4. No new or enlarging pulmonary nodules identified Musculoskeletal: Extensive, widespread sclerotic bone metastases again noted. Subjectively increased to previous exam. CT ABDOMEN PELVIS FINDINGS Hepatobiliary: Multifocal liver metastases again identified. Index lesion within the right lobe measures 2.6 cm, image 41 of series 2. Previously this measured the same. Lateral segment of left lobe of liver lesion measures 1.5 cm, image 49 of series 2. Previously 2.1 cm. The index lesion within the lateral right lobe of liver measures 2 cm, image 51 of series 2. Previously 2.3 cm. Index lesion within the inferior right lobe measures 2.4 cm, image 59 of series 2. Previously 2.6 cm. Gallbladder appears normal. No biliary dilatation. Pancreas: Normal appearance of the pancreas. Spleen: No change in small low density structure within the spleen measuring 6 mm, image 51 of series 2. Adrenals/Urinary Tract: The adrenal glands are normal. There is persistent bilateral pelvocaliectasis. No hydroureter. Enhancing nodule along the anterior wall of the bladder is again noted, image 81 89 of series 6. Stomach/Bowel: The stomach is normal. The small bowel loops have a normal caliber. No bowel obstruction. No pathologic dilatation of the colon. Increase scratch set there is mild thickening of the appendix which measures 8 mm in diameter, image 77 of series 2. No free fluid or fluid collections noted within  in the right lower quadrant.  Vascular/Lymphatic: Normal appearance of the abdominal aorta. No enlarged upper abdominal lymph nodes. There is no pelvic or inguinal adenopathy. Reproductive: The uterus appears unremarkable. The right ovary measures 2.8 x 2.0 cm, image 89 of series 2. Previously 2.8 x 1.5 cm. Other: There is a small amount of free fluid identified within the pelvis. This is new from previous exam. Additionally, there is a suggestion subtle soft tissue nodularity within the peritoneal fat. For example, image number 64 of series 2 Musculoskeletal: Diffuse all osseous sclerosis compatible secondary to metastatic disease is again identified. IMPRESSION: 1. Index liver lesions are slightly decreased in size from previous exam. 2. Widespread sclerotic bone metastasis as noted previously. Subjectively there may be an increase in the sclerosis which could reflect healing of bone metastases or progression of sclerotic bone metastases. 3. New ascites identified within the pelvis. Subtle areas of soft tissue nodularity within the peritoneum is concerning for development of peritoneal metastases. 4. Slight enlargement of the right ovary. Attention to the right ovary on follow-up imaging to monitor for potential ovarian metastases. Electronically Signed   By: Kerby Moors M.D.   On: 10/11/2016 11:59   Dg Chest Port 1 View  Result Date: 10/25/2016 CLINICAL DATA:  PICC line placement EXAM: PORTABLE CHEST 1 VIEW COMPARISON:  10/11/2016 chest CT FINDINGS: The heart is normal in size. There is aortic atherosclerosis. Diffuse axial and appendicular skeletal metastasis is noted. PICC line tip terminates at the cavoatrial junction from a right-sided approach. New car fell linear pulmonary opacities in the right upper lobe cannot exclude pneumonia. The left lung is clear. There is no effusion. IMPRESSION: PICC line tip in the cavoatrial junction. Patchy airspace opacities in the right upper lobe are new and may reflect pneumonia. Diffuse  osteoblastic skeletal metastatic disease. Electronically Signed   By: Ashley Royalty M.D.   On: 10/25/2016 22:17   Dg Addison Bailey G Tube Plc W/fl W/rad  Result Date: 10/26/2016 CLINICAL DATA:  NG tube placement EXAM: NASO G TUBE PLACEMENT WITH FL AND WITH RAD FLUOROSCOPY TIME:  Fluoroscopy Time:  6 second Radiation Exposure Index (if provided by the fluoroscopic device): Number of Acquired Spot Images: 0 COMPARISON:  None. FINDINGS: NG tube was placed by the radiologist. Single spot image over the average demonstrates the NG tube tip in the proximal stomach. IMPRESSION: NG tube tip in the proximal stomach. Electronically Signed   By: Rolm Baptise M.D.   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 Clinically worsening patient with new onset of visual impairment; cortical blindness. no improvement on steroids or radiation. Discussed with Dr. Donella Stade; at this time do not feel radiation is going to add patient's life expectancy or quality of life. Recommend discontinuation of radiation. Continue steroids for comfort For now.   # Pain control-  poorly controlled patient needing IV morphine every 2 hours or so.  I recommend morphine continuous drip; discussed with the husband. He is in agreement.  Awaiting for the daughter to discuss the same; and start the trip. Discussed with Meghan from palliative care.   # disposition- patient needs to go to [facility] in [place] as she  is from [place]; however given the upcoming holidays- this might be difficult transition. Given the decline in the clinical  status/with the plan to start morphine drip- it's reasonable to monitor the patient for the next 48 hours; and then decide on disposition. Discussed with Hassan Rowan.   # Pt- DNR/DNI.   # 35 minutes face-to-face with the patient's family discussing the above plan of care; more than 50% of time spent on prognosis/ natural history; counseling and coordination.    Cammie Sickle, MD 10/31/2016 8:30 AM

## 2016-10-31 NOTE — Progress Notes (Signed)
Pharmacy Antibiotic Note  Elizabeth Mccoy is a 60 y.o. female admitted on 10/21/2016 with intractable nausea and vomiting secondary to metastatic breast cancer.  Pharmacy has been consulted to dose zosyn for aspiration pneumonia.  Day 6 of antibiotics  Plan: Will start Zosyn 3.375 g IVq8 hours. Will ask to de-escalate antibiotic therapy.    Height: 5\' 1"  (154.9 cm) Weight: 123 lb 1.6 oz (55.8 kg) IBW/kg (Calculated) : 47.8  Temp (24hrs), Avg:97.9 F (36.6 C), Min:97.6 F (36.4 C), Max:98.1 F (36.7 C)   Recent Labs Lab 10/26/16 0356 10/27/16 0541 10/28/16 0548 10/29/16 0528 10/30/16 0455 10/31/16 0543  WBC 14.3*  --   --   --   --   --   CREATININE 0.37* 0.31* 0.36* <0.30* 0.33* <0.30*    CrCl cannot be calculated (This lab value cannot be used to calculate CrCl because it is not a number: <0.30).    Allergies  Allergen Reactions  . Beef Extract Anaphylaxis  . Pork (Porcine) Protein Anaphylaxis  . Beef-Derived Products Hives    Antimicrobials this admission: Levaquin 11/17 >> 11/18 Zosyn 11/17 >>    Microbiology results:   Thank you for allowing pharmacy to be a part of this patient's care.  Loree Fee, PharmD 10/31/2016 7:27 AM

## 2016-10-31 NOTE — Progress Notes (Signed)
[facility] for electrolyte management  Indication: hypophosphatemia   Pharmacy consulted for electrolyte management for 60 yo female severe malnutrition in setting of  Metastatic breast cancer. Patient does not meet recommended requirements for receiving TPN and is having a NG tube placed   No NGT placed at this time and patient is not receiving feeding supplements. Not able to take oral meds. Calcium 6.2, corrected Ca: 6.92, Phos 1.5   Plan:   11/21: Calcium gluconate 2gm IV x 1, Potassium phosphate 60mmol IV x 1, Recheck electrolytes with AM labs  11/22: Calcium gluconate 2gm IV x1, sodium phosphate 49mmol IV x1. Recheck phosphorous at 1600. Recheck electrolytes with AM labs.    Allergies  Allergen Reactions  . Beef Extract Anaphylaxis  . Pork (Porcine) Protein Anaphylaxis  . Beef-Derived Products Hives    Patient Measurements: Height: 5\' 1"  (154.9 cm) Weight: 123 lb 1.6 oz (55.8 kg) IBW/kg (Calculated) : 47.8   Vital Signs: Temp: 97.6 F (36.4 C) (11/22 0445) Temp Source: Oral (11/22 0445) BP: 152/91 (11/22 0445) Pulse Rate: 86 (11/22 0445) Intake/Output from previous day: 11/21 0701 - 11/22 0700 In: 2042.8 [I.V.:1323.3; IV Piggyback:719.4] Out: 1600 [Urine:1600] Intake/Output from this shift: No intake/output data recorded.  Labs:  Recent Labs  10/29/16 0528 10/30/16 0455 10/31/16 0543  CREATININE <0.30* 0.33* <0.30*  MG 2.2  --  1.9  PHOS 1.9* 2.0* 1.5*  ALBUMIN  --  3.1*  --    CrCl cannot be calculated (This lab value cannot be used to calculate CrCl because it is not a number: <0.30).   Pharmacy will continue to monitor and adjust per consult.    Loree Fee, PharmD 10/31/2016,7:14 AM

## 2016-10-31 NOTE — Progress Notes (Signed)
[facility] NAME: Elizabeth Mccoy    MRN#:  [id number]  DATE OF BIRTH:  1956/09/12  SUBJECTIVE:  Hospital Day: 10 days Elizabeth Mccoy is a 60 y.o. female presenting with Nausea and Dizziness .   Overnight events: No acute overnight events Interval Events: Patient unable to provide information given mental status/medical condition. Daughter at bedside, patient lethargic intermittently moaning-to start on morphine drip this morning  REVIEW OF SYSTEMS:  Unable to obtain given patient's medical condition  DRUG ALLERGIES:   Allergies  Allergen Reactions  . Beef Extract Anaphylaxis  . Pork (Porcine) Protein Anaphylaxis  . Beef-Derived Products Hives    VITALS:  Blood pressure (!) 152/91, pulse 86, temperature 97.6 F (36.4 C), temperature source Oral, resp. rate 16, height 5\' 1"  (1.549 m), weight 55.8 kg (123 lb 1.6 oz), SpO2 97 %.  PHYSICAL EXAMINATION:   VITAL SIGNS: Vitals:   10/30/16 2016 10/31/16 0445  BP: (!) 159/76 (!) 152/91  Pulse: 77 86  Resp: 16 16  Temp: 98.1 F (36.7 C) 97.6 F (36.4 C)   GENERAL:60 y.o.female moderate distress given mental status. Chronically ill, weak appearing HEAD: Normocephalic, atraumatic.  EYES: Pupils equal, round, reactive to light. Unable to assess extraocular muscles given mental status/medical condition. No scleral icterus.  MOUTH: Moist mucosal membrane. Dentition intact. No abscess noted.  EAR, NOSE, THROAT: Clear without exudates. No external lesions.  NECK: Supple. No thyromegaly. No nodules. No JVD.  PULMONARY: Clear to ascultation, without wheeze rails or rhonci. No use of accessory muscles, Good respiratory effort. good air entry bilaterally CHEST: Nontender to palpation.  CARDIOVASCULAR: S1 and S2. Regular rate and rhythm. No murmurs, rubs, or gallops. No edema. Pedal pulses 2+ bilaterally.  GASTROINTESTINAL: Soft, nontender, nondistended. No masses. Positive  bowel sounds. No hepatosplenomegaly.  MUSCULOSKELETAL: No swelling, clubbing, or edema. Range of motion full in all extremities.  NEUROLOGIC: Unable to assess given mental status/medical condition SKIN: No ulceration, lesions, rashes, or cyanosis. Skin warm and dry. Turgor intact.  PSYCHIATRIC: Unable to assess given mental status/medical condition      LABORATORY PANEL:   CBC  Recent Labs Lab 10/26/16 0356  WBC 14.3*  HGB 12.0  HCT 35.6  PLT 150   ------------------------------------------------------------------------------------------------------------------  Chemistries   Recent Labs Lab 10/26/16 0356  10/31/16 0543  NA 130*  < > 126*  K 3.9  < > 4.6  CL 101  < > 97*  CO2 21*  < > 21*  GLUCOSE 133*  < > 131*  BUN 13  < > 9  CREATININE 0.37*  < > <0.30*  CALCIUM 6.9*  < > 6.2*  MG 2.4  < > 1.9  AST 85*  --   --   ALT 217*  --   --   ALKPHOS 164*  --   --   BILITOT 2.7*  --   --   < > = values in this interval not displayed. ------------------------------------------------------------------------------------------------------------------  Cardiac Enzymes No results for input(s): TROPONINI in the last 168 hours. ------------------------------------------------------------------------------------------------------------------  RADIOLOGY:  No results found.  EKG:   Orders placed or performed in visit on 10/21/16  . EKG 12-Lead    ASSESSMENT AND PLAN:   Elizabeth Mccoy is a 60 y.o. female presenting with Nausea and Dizziness . Admitted 10/21/2016 : Day #: 10 days 1. 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 Aspiration pneumonia 3. 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 Management plans discussed with the patient, family and they are in agreement.  CODE STATUS: dnr TOTAL TIME TAKING CARE OF THIS PATIENT: 28 minutes.   POSSIBLE D/C IN 1-2DAYS, DEPENDING ON CLINICAL CONDITION.   Elizabeth Mccoy,  Karenann Cai.D on 10/31/2016 at 12:50 PM  Between 7am to 6pm - Pager - [phone]  After 6pm: House Pager: - [facility]  Office  [phone]  CC: Primary care physician; Gerilyn Pilgrim, FNP

## 2016-10-31 NOTE — Progress Notes (Addendum)
Daily Progress Note   Patient Name: Elizabeth Mccoy       Date: 10/31/2016 DOB: 1956/07/20  Age: 60 y.o. MRN#: [id number] Attending Physician: Lytle Butte, MD Primary Care Physician: Gerilyn Pilgrim, FNP Admit Date: 10/21/2016  Reason for Consultation/Follow-up: Establishing goals of care  Subjective: Husband, daughter, and granddaughter at bedside this morning. Patient lethargic with intermittent moaning. Husband tells me she had a rough night, with moaning and discomfort. Requiring frequent prn morphine. Discussed and educated on continuous morphine infusion. Husband and Daughter agree with this to provide her more comfort. They agree with me discontinuing labs and pills. Husband spoke with Dr. Rogue Bussing this AM with recommendation of stopping radiation. Husband and daughter agree with this as well. Educated on EOL expectations and symptom management. Answered questions. Provided emotional support.   Length of Stay: 10  Current Medications: Scheduled Meds:  . dexamethasone  4 mg Intravenous Q6H  . haloperidol lactate  2.5 mg Intramuscular Once  . piperacillin-tazobactam (ZOSYN)  IV  3.375 g Intravenous Q8H    Continuous Infusions: . sodium chloride 50 mL/hr at 10/31/16 0006  . morphine      PRN Meds: glycopyrrolate, LORazepam, morphine injection, morphine, ondansetron (ZOFRAN) IV, promethazine  Physical Exam  Constitutional: She appears lethargic. She appears cachectic. She appears ill.  HENT:  thrush  Eyes:  blindness  Cardiovascular: Regular rhythm and normal heart sounds.   Pulmonary/Chest: She has decreased breath sounds. She has rhonchi.  Abdominal: Soft. Bowel sounds are decreased.  Neurological: She appears lethargic.  Skin: Skin is warm and dry. There is  pallor.  Psychiatric:  Intermittent moaning She is inattentive.  Nursing note and vitals reviewed.          Vital Signs: BP (!) 152/91 (BP Location: Left Arm)   Pulse 86   Temp 97.6 F (36.4 C) (Oral)   Resp 16   Ht 5\' 1"  (1.549 m)   Wt 55.8 kg (123 lb 1.6 oz)   LMP  (Approximate)   SpO2 97%   BMI 23.26 kg/m  SpO2: SpO2: 97 % O2 Device: O2 Device: Not Delivered O2 Flow Rate:    Intake/output summary:   Intake/Output Summary (Last 24 hours) at 10/31/16 0942 Last data filed at 10/31/16 0538  Gross per 24 hour  Intake  1988.75 ml  Output             1600 ml  Net           388.75 ml   LBM: Last BM Date: 10/24/16 Baseline Weight: Weight: 55.8 kg (123 lb) Most recent weight: Weight: 55.8 kg (123 lb 1.6 oz)       Palliative Assessment/Data: PPS 10%   Flowsheet Rows   Flowsheet Row Most Recent Value  Intake Tab  Referral Department  Oncology  Unit at Time of Referral  Oncology Unit  Palliative Care Primary Diagnosis  Cancer  Date Notified  10/26/16  Palliative Care Type  New Palliative care  Reason for referral  Clarify Goals of Care  Date of Admission  10/21/16  Date first seen by Palliative Care  10/29/16  # of days IP prior to Palliative referral  5  Clinical Assessment  Palliative Performance Scale Score  10%  Psychosocial & Spiritual Assessment  Palliative Care Outcomes  Patient/Family meeting held?  Yes  Who was at the meeting?  husband in AM, daughter this afternoon  Palliative Care Outcomes  Clarified goals of care, Provided end of life care assistance, Provided psychosocial or spiritual support, Counseled regarding hospice      Patient Active Problem List   Diagnosis Date Noted  . Thrush, oral   . Palliative care encounter   . Encounter for hospice care discussion   . Protein-calorie malnutrition, severe 10/23/2016  . Intractable nausea and vomiting 10/21/2016  . 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  Carcinoma of overlapping sites of right breast in female, estrogen receptor positive ([place]) 10/10/2016  . Pancytopenia, acquired ([place]) 09/05/2016  . Chest wall discomfort 09/05/2016  . Metastases to the liver ([place]) 09/05/2016  . Metastatic cancer ([place]) 06/27/2016  . Malignant neoplasm of overlapping sites of right breast ([place]) 06/15/2016  . Menopause     Palliative Care Assessment & Plan   Patient Profile: 61 y.o. female  with past medical history of breast cancer with metastases to liver, bone, and brain including leptomeningeal metastasis admitted on 10/21/2016 with poor PO intake, dehydration, nausea, and weakness. Worsening headache and nausea from leptomeningeal mets with new onset blindness. Patient receiving steroids and radiation. Also with elevated LFT's likely due to disease progression per Dr. Rogue Bussing. Poor nutrition-patient is not a candidate for TPN and NGT is out. Very poor prognosis. Currently receiving radiation. Palliative medicine consultation for goals of care.   Assessment: Breast cancer with liver, bone, and brain metastases Leptomeningeal metastasis Pain Nausea Thrush  Recommendations/Plan:  Morphine 1mg /hr continuous infusion. RN may bolus 0.5mg  q64min prn pain/dyspnea. If three bolus doses are given in one hour, may titrate basal rate up 50%. Max 10mg /hr  Ativan 1mg  IV q2h prn anxiety  Robinul 0.2mg  IV q4h prn secretions.   Continue decadron for comfort.   Discontinued labs/PO medications. Will leave IVF for now per family. Discussed with Dr. Lavetta Nielsen.   Transitioning to comfort measures only. Anticipate hospital death.   Code Status: DNR   Code Status Orders        Start     Ordered   10/27/16 1110  Do not attempt resuscitation (DNR)  Continuous    Question Answer Comment  In the event of cardiac or respiratory ARREST Do not call a "code blue"   In the event of cardiac or respiratory ARREST Do not perform Intubation, CPR, defibrillation or ACLS     In the event of cardiac  or respiratory ARREST Use medication by any route, position, wound care, and other measures to relive pain and suffering. May use oxygen, suction and manual treatment of airway obstruction as needed for comfort.   Comments NURSE MAY PRONOUNCE.      10/27/16 1110    Code Status History    Date Active Date Inactive Code Status Order ID Comments User Context   10/27/2016 11:01 AM 10/27/2016 11:10 AM DNR [id number]  Lloyd Huger, MD Inpatient   10/21/2016  8:03 PM 10/27/2016 11:01 AM Full Code [id number]  Baxter Hire, MD Inpatient       Prognosis:   Hours - Days Metastatic breast cancer to bone, liver, and leptomeningeal involvement. Severe nutrition and functional status decline.   Discharge Planning:  Anticipated Hospital Death    Care plan was discussed with daughter, husband, RN, Dr. Lavetta Nielsen, and Dr. Rogue Bussing  Thank you for allowing the Palliative Medicine Team to assist in the care of this patient.   Time In: 0910 Time Out: 0945 Total Time 77min Prolonged Time Billed  no      Greater than 50%  of this time was spent counseling and coordinating care related to the above assessment and plan.  Ihor Dow, FNP-C Palliative Medicine Team  Phone: [phone] Fax: [phone] Cell: [phone]  Please contact Palliative Medicine Team phone at [phone] for questions and concerns.

## 2016-11-01 NOTE — Progress Notes (Signed)
[facility] NOTE  Patient Care Team: Gerilyn Pilgrim, FNP as PCP - General (Nurse Practitioner)  CHIEF COMPLAINTS/PURPOSE OF CONSULTATION:  Metastatic breast cancer/ patient unable to provide any history because of mental status changes/morphine medication  CC: History is provided by patient's daughter/sister-in-law at the bedside. Patient had "better night" ; on the IV morphine drip. ROS: Unable to assess  MEDICAL HISTORY:  Past Medical History:  Diagnosis Date  . Breast cancer, right ([place]) 2015   rad tx's on right hip.  . Menopause 12/10/2006   2008    SURGICAL HISTORY: Past Surgical History:  Procedure Laterality Date  . CESAREAN SECTION      SOCIAL HISTORY: Social History   Social History  . Marital status: Married    Spouse name: N/A  . Number of children: 2  . Years of education: N/A   Occupational History  . Not on file.   Social History Main Topics  . Smoking status: Never Smoker  . Smokeless tobacco: Never Used  . Alcohol use No  . Drug use: No  . Sexual activity: Not on file   Other Topics Concern  . Not on file   Social History Narrative  . No narrative on file    FAMILY HISTORY: Family History  Problem Relation Age of Onset  . Cancer Father     prostate ca  . Cancer Sister     cancer unknown primary    ALLERGIES:  is allergic to beef extract; pork (porcine) protein; and beef-derived products.  MEDICATIONS:  Current Facility-Administered Medications  Medication Dose Route Frequency Provider Last Rate Last Dose  . 0.9 %  sodium chloride infusion   Intravenous Continuous Epifanio Lesches, MD 50 mL/hr at 11/01/16 1204    . dexamethasone (DECADRON) injection 4 mg  4 mg Intravenous Q6H Baxter Hire, MD   4 mg at 11/01/16 1204  . glycopyrrolate (ROBINUL) injection 0.2 mg  0.2 mg Intravenous Q4H PRN Basilio Cairo, NP      . haloperidol lactate (HALDOL) injection 2.5 mg  2.5 mg Intramuscular Once Epifanio Lesches, MD       . LORazepam (ATIVAN) injection 1 mg  1 mg Intravenous Q2H PRN Harrie Foreman, MD   1 mg at 10/31/16 0343  . morphine 100mg  in NS 128mL (1mg /mL) infusion - premix  3 mg/hr Intravenous Continuous Lytle Butte, MD 3 mL/hr at 11/01/16 0829 3 mg/hr at 11/01/16 0829  . morphine bolus via infusion 0.5 mg  0.5 mg Intravenous Q15 min PRN Basilio Cairo, NP   0.5 mg at 11/01/16 0829  . ondansetron (ZOFRAN) injection 4 mg  4 mg Intravenous Q6H PRN Hillary Bow, MD   4 mg at 10/29/16 0153  . promethazine (PHENERGAN) injection 25 mg  25 mg Intravenous Q6H PRN Epifanio Lesches, MD   25 mg at 10/25/16 1308      .  PHYSICAL EXAMINATION:  Vitals:   10/31/16 0445 11/01/16 0413  BP: (!) 152/91 (!) 159/91  Pulse: 86 (!) 102  Resp: 16 13  Temp: 97.6 F (36.4 C) 97.5 F (36.4 C)   Filed Weights   10/29/16 0500 10/30/16 0453 10/31/16 0500  Weight: 119 lb (54 kg) 120 lb 3.2 oz (54.5 kg) 123 lb 1.6 oz (55.8 kg)    GENERAL: Moderate built moderately nourished female patient resting in the bed. Resting comfortably Accompanied by her daughter/family EYES: no pallor or icterus OROPHARYNX: Cannot be examined felt LUNGS: decreased breath sounds to auscultation  at bases and  No wheeze or crackles HEART/CVS: regular rate & rhythm and no murmurs; No lower extremity edema ABDOMEN: abdomen soft, non-tender and normal bowel sounds Musculoskeletal:no cyanosis of digits and no clubbing  PSYCH:  drowsy ; not arousable  NEURO:  cannot be performed  SKIN:  no rashes or significant lesions  LABORATORY DATA:  I have reviewed the data as listed Lab Results  Component Value Date   WBC 14.3 (H) 10/26/2016   HGB 12.0 10/26/2016   HCT 35.6 10/26/2016   MCV 95.2 10/26/2016   PLT 150 10/26/2016    Recent Labs  10/21/16 1458  10/23/16 0457 10/26/16 0356  10/29/16 0528 10/30/16 0455 10/31/16 0543  NA 127*  < > 133* 130*  < > 129* 132* 126*  K 4.4  < > 4.4 3.9  < > 4.2 4.1 4.6  CL 97*  < > 104 101  < >  101 102 97*  CO2 19*  < > 21* 21*  < > 20* 21* 21*  GLUCOSE 117*  < > 135* 133*  < > 121* 125* 131*  BUN 17  < > 12 13  < > 12 11 9   CREATININE 0.48  < > 0.40* 0.37*  < > <0.30* 0.33* <0.30*  CALCIUM 7.8*  < > 7.4* 6.9*  < > 6.1* 6.0* 6.2*  GFRNONAA >60  < > >60 >60  < > NOT CALCULATED >60 NOT CALCULATED  GFRAA >60  < > >60 >60  < > NOT CALCULATED >60 NOT CALCULATED  PROT 7.1  --  6.3* 6.3*  --   --   --   --   ALBUMIN 4.2  --  3.7 3.5  --   --  3.1*  --   AST 54*  --  55* 85*  --   --   --   --   ALT 64*  --  81* 217*  --   --   --   --   ALKPHOS 229*  --  164* 164*  --   --   --   --   BILITOT 1.0  --  0.8 2.7*  --   --   --   --   BILIDIR  --   --   --  1.4*  --   --   --   --   IBILI  --   --   --  1.3*  --   --   --   --   < > = values in this interval not displayed.  RADIOGRAPHIC STUDIES: I have personally reviewed the radiological images as listed and agreed with the findings in the report. Ct Head Wo Contrast  Result Date: 10/21/2016 CLINICAL DATA:  Metastatic breast cancer. EXAM: CT HEAD WITHOUT CONTRAST TECHNIQUE: Contiguous axial images were obtained from the base of the skull through the vertex without intravenous contrast. COMPARISON:  MRI head 10/12/2016 FINDINGS: Brain: Progressive ventricular dilatation since the prior MRI. This is most consistent with hydrocephalus in this patient with metastatic disease and may be due to leptomeningeal tumor. No obstructing mass lesion. Multiple small metastatic deposits are seen throughout the brain on the MRI. These are not well seen on unenhanced CT however there is mild edema in the right occipital lobe and in the right frontal lobe over the convexity consistent with metastatic disease. Negative for hemorrhage. Vascular: Negative for dense MCA Skull: Patchy sclerotic disease in the clivus compatible with metastatic disease. No destructive skull lesion.  Sinuses/Orbits: Infiltration of the orbit with enlargement of the extra-ocular muscles  most consistent with metastatic disease to the orbit as identified on MRI. Other: None IMPRESSION: Progressive ventricular dilatation since the recent MRI compatible with hydrocephalus. 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 Multiple brain metastasis better seen on MRI. No large obstructing mass lesion is identified. Negative for hemorrhage Metastatic disease to the clivus. Metastatic disease to the orbit bilaterally. Electronically Signed   By: Franchot Gallo M.D.   On: 10/21/2016 17:22   Ct Chest W Contrast  Result Date: 10/11/2016 CLINICAL DATA:  Metastatic breast cancer. EXAM: CT CHEST, ABDOMEN, AND PELVIS WITH CONTRAST TECHNIQUE: Multidetector CT imaging of the chest, abdomen and pelvis was performed following the standard protocol during bolus administration of intravenous contrast. CONTRAST:  124mL ISOVUE-300 IOPAMIDOL (ISOVUE-300) INJECTION 61% COMPARISON:  08/29/2016 FINDINGS: CT CHEST FINDINGS Cardiovascular: The heart size appears normal. Aortic atherosclerosis noted. Mediastinum/Nodes: The trachea appears patent and is midline. Normal appearance of the esophagus. Right axillary node measures 9 mm, image number 18 of series 2. Previously this measured the same. Interval decrease in soft tissue stranding within the right axilla which may be related to prior instrumentation. No enlarged mediastinal or hilar nodes. Lungs/Pleura: Small right pleural effusion. Mild diffuse bronchial wall thickening noted. Left upper lobe pulmonary nodule is unchanged measuring 4 mm, image 56 of series 4. Perifissural nodule in the left lower lobe is stable measuring 4 mm, image number 80 of series 4. No new or enlarging pulmonary nodules identified Musculoskeletal: Extensive, widespread sclerotic bone metastases again noted. Subjectively increased to previous exam. CT ABDOMEN PELVIS FINDINGS Hepatobiliary: Multifocal liver metastases again identified. Index lesion within the right  lobe measures 2.6 cm, image 41 of series 2. Previously this measured the same. Lateral segment of left lobe of liver lesion measures 1.5 cm, image 49 of series 2. Previously 2.1 cm. The index lesion within the lateral right lobe of liver measures 2 cm, image 51 of series 2. Previously 2.3 cm. Index lesion within the inferior right lobe measures 2.4 cm, image 59 of series 2. Previously 2.6 cm. Gallbladder appears normal. No biliary dilatation. Pancreas: Normal appearance of the pancreas. Spleen: No change in small low density structure within the spleen measuring 6 mm, image 51 of series 2. Adrenals/Urinary Tract: The adrenal glands are normal. There is persistent bilateral pelvocaliectasis. No hydroureter. Enhancing nodule along the anterior wall of the bladder is again noted, image 81 89 of series 6. Stomach/Bowel: The stomach is normal. The small bowel loops have a normal caliber. No bowel obstruction. No pathologic dilatation of the colon. Increase scratch set there is mild thickening of the appendix which measures 8 mm in diameter, image 77 of series 2. No free fluid or fluid collections noted within in the right lower quadrant. Vascular/Lymphatic: Normal appearance of the abdominal aorta. No enlarged upper abdominal lymph nodes. There is no pelvic or inguinal adenopathy. Reproductive: The uterus appears unremarkable. The right ovary measures 2.8 x 2.0 cm, image 89 of series 2. Previously 2.8 x 1.5 cm. Other: There is a small amount of free fluid identified within the pelvis. This is new from previous exam. Additionally, there is a suggestion subtle soft tissue nodularity within the peritoneal fat. For example, image number 64 of series 2 Musculoskeletal: Diffuse all osseous sclerosis compatible secondary to metastatic disease is again identified. IMPRESSION: 1. Index liver lesions are slightly decreased in size from previous exam. 2. Widespread sclerotic bone metastasis  as noted previously. Subjectively there  may be an increase in the sclerosis which could reflect healing of bone metastases or progression of sclerotic bone metastases. 3. New ascites identified within the pelvis. Subtle areas of soft tissue nodularity within the peritoneum is concerning for development of peritoneal metastases. 4. Slight enlargement of the right ovary. Attention to the right ovary on follow-up imaging to monitor for potential ovarian metastases. Electronically Signed   By: Kerby Moors M.D.   On: 10/11/2016 11:59   Mr Jeri Cos [id number] Contrast  Result Date: 10/12/2016 CLINICAL DATA:  60 year old female with metastatic breast cancer. Staging. Subsequent encounter. EXAM: MRI HEAD WITHOUT AND WITH CONTRAST TECHNIQUE: Multiplanar, multiecho pulse sequences of the brain and surrounding structures were obtained without and with intravenous contrast. CONTRAST:  70mL MULTIHANCE GADOBENATE DIMEGLUMINE 529 MG/ML IV SOLN COMPARISON:  PET-CT 11/14/2015. FINDINGS: Brain: Numerous small brain metastases. There are at least 15 small supratentorial metastases ranging from punctate to 10 mm diameter. There is also extensive metastatic disease affecting the cerebellum with widespread small areas of abnormal enhancement, many of which are suspicious for leptomeningeal disease (series 16 images 14 and 15). The largest individual cerebellar lesions are 8-9 mm. Despite the extensive disease there is only occasional mild cerebral and cerebellar edema, and no significant intracranial mass effect. No hemorrhagic metastases. No abnormal ependymoma 1 enhancement. No pachymeningeal thickening or enhancement. No restricted diffusion or evidence of acute infarction. No ventriculomegaly. No acute intracranial hemorrhage identified. Negative pituitary. Negative cervicomedullary junction. No chronic cerebral blood products identified. There are small chronic lacunar infarcts in the left cerebellum. Subtle chronic cortical encephalomalacia in the right parietal lobe  (series 9, image 21) compatible with previous small posterior right MCA infarct. Vascular: Major intracranial vascular flow voids are preserved, the distal left vertebral artery appears dominant. Skull and upper cervical spine: Diffuse abnormal bone marrow signal throughout the cervical spine and at the skullbase in keeping with widespread osseous metastatic disease. No destructive calvarial lesion identified. Negative visualized cervical spinal cord. Sinuses/Orbits: Abnormal heterogeneous appearance of the bilateral intraorbital soft tissues including the right lateral rectus muscle which is expanded in T2 hyperintense (series 14, image 20 and series 9, image 9). Similar heterogeneity throughout the left orbital soft tissues. There seems to be a mild degree of enophthalmos. The globes appear remain intact. Trace paranasal sinus mucosal thickening. Other: Mastoids are clear. Visible internal auditory structures appear normal. Negative scalp soft tissues. IMPRESSION: 1. Widespread metastatic disease to the brain. At least 15 small supratentorial metastases ranging from punctate to 10 mm diameter, and extensive cerebellar metastatic disease which appears highly suspicious for leptomeningeal metastases. 2. Only mild scattered brain edema and no significant intracranial mass effect. 3. Bilateral orbit soft tissue metastases (scirrhous infiltration of the orbits). 4. Widespread osseous metastatic disease. Electronically Signed   By: Genevie Ann M.D.   On: 10/12/2016 13:18   Mr Thoracic Spine W Wo Contrast  Result Date: 10/23/2016 CLINICAL DATA:  Metastatic breast cancer. EXAM: MRI THORACIC AND LUMBAR SPINE WITHOUT AND WITH CONTRAST TECHNIQUE: Multiplanar and multiecho pulse sequences of the thoracic and lumbar spine were obtained without and with intravenous contrast. CONTRAST:  77mL MULTIHANCE GADOBENATE DIMEGLUMINE 529 MG/ML IV SOLN COMPARISON:  Bone scan 08/29/2016 FINDINGS: MRI THORACIC SPINE FINDINGS Alignment:   Physiologic. Vertebrae: Heterogeneous marrow diffusely from extensive metastatic disease. Every thoracic vertebra is involved. No extraosseous tumor growth is noted in the epidural or foraminal spaces. No pathologic fracture. Cord: The cord has normal signal, but there is  diffuse surface enhancement of the cord and intrathecal roots. Paraspinal and other soft tissues: Known hepatic metastatic disease. Trace right pleural effusion. Disc levels: No degenerative impingement MRI LUMBAR SPINE FINDINGS Segmentation:  Standard. Alignment:  Physiologic. Vertebrae: Metastases throughout all lumbar levels. No pathologic fracture or epidural/ foraminal infiltration. Conus medullaris: Extends to the T12 level and appears normal. There is diffuse thickening enhancement of the cauda equina Paraspinal and other soft tissues: The bladder is over distended and there is bilateral moderate hydroureteronephrosis. Disc levels: Lower lumbar facet arthropathy.  No degenerative impingement. These results will be called to the ordering clinician or representative by the Radiologist Assistant, and communication documented in the PACS or zVision Dashboard. IMPRESSION: 1. 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 2. Widespread osseous metastatic disease without pathologic fracture or detected epidural infiltration. 3. Over distended bladder with bilateral hydronephrosis, correlate for retention related to #1. 4. Known hepatic metastatic disease. Electronically Signed   By: Monte Fantasia M.D.   On: 10/23/2016 13:37   Mr Lumbar Spine W Wo Contrast  Result Date: 10/23/2016 CLINICAL DATA:  Metastatic breast cancer. EXAM: MRI THORACIC AND LUMBAR SPINE WITHOUT AND WITH CONTRAST TECHNIQUE: Multiplanar and multiecho pulse sequences of the thoracic and lumbar spine were obtained without and with intravenous contrast. CONTRAST:  75mL MULTIHANCE GADOBENATE DIMEGLUMINE 529  MG/ML IV SOLN COMPARISON:  Bone scan 08/29/2016 FINDINGS: MRI THORACIC SPINE FINDINGS Alignment:  Physiologic. Vertebrae: Heterogeneous marrow diffusely from extensive metastatic disease. Every thoracic vertebra is involved. No extraosseous tumor growth is noted in the epidural or foraminal spaces. No pathologic fracture. Cord: The cord has normal signal, but there is diffuse surface enhancement of the cord and intrathecal roots. Paraspinal and other soft tissues: Known hepatic metastatic disease. Trace right pleural effusion. Disc levels: No degenerative impingement MRI LUMBAR SPINE FINDINGS Segmentation:  Standard. Alignment:  Physiologic. Vertebrae: Metastases throughout all lumbar levels. No pathologic fracture or epidural/ foraminal infiltration. Conus medullaris: Extends to the T12 level and appears normal. There is diffuse thickening enhancement of the cauda equina Paraspinal and other soft tissues: The bladder is over distended and there is bilateral moderate hydroureteronephrosis. Disc levels: Lower lumbar facet arthropathy.  No degenerative impingement. These results will be called to the ordering clinician or representative by the Radiologist Assistant, and communication documented in the PACS or zVision Dashboard. IMPRESSION: 1. 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 2. Widespread osseous metastatic disease without pathologic fracture or detected epidural infiltration. 3. Over distended bladder with bilateral hydronephrosis, correlate for retention related to #1. 4. Known hepatic metastatic disease. Electronically Signed   By: Monte Fantasia M.D.   On: 10/23/2016 13:37   Ct Abdomen Pelvis W Contrast  Result Date: 10/11/2016 CLINICAL DATA:  Metastatic breast cancer. EXAM: CT CHEST, ABDOMEN, AND PELVIS WITH CONTRAST TECHNIQUE: Multidetector CT imaging of the chest, abdomen and pelvis was performed following the standard  protocol during bolus administration of intravenous contrast. CONTRAST:  127mL ISOVUE-300 IOPAMIDOL (ISOVUE-300) INJECTION 61% COMPARISON:  08/29/2016 FINDINGS: CT CHEST FINDINGS Cardiovascular: The heart size appears normal. Aortic atherosclerosis noted. Mediastinum/Nodes: The trachea appears patent and is midline. Normal appearance of the esophagus. Right axillary node measures 9 mm, image number 18 of series 2. Previously this measured the same. Interval decrease in soft tissue stranding within the right axilla which may be related to prior instrumentation. No enlarged mediastinal or hilar nodes. Lungs/Pleura: Small right pleural effusion. Mild diffuse  bronchial wall thickening noted. Left upper lobe pulmonary nodule is unchanged measuring 4 mm, image 56 of series 4. Perifissural nodule in the left lower lobe is stable measuring 4 mm, image number 80 of series 4. No new or enlarging pulmonary nodules identified Musculoskeletal: Extensive, widespread sclerotic bone metastases again noted. Subjectively increased to previous exam. CT ABDOMEN PELVIS FINDINGS Hepatobiliary: Multifocal liver metastases again identified. Index lesion within the right lobe measures 2.6 cm, image 41 of series 2. Previously this measured the same. Lateral segment of left lobe of liver lesion measures 1.5 cm, image 49 of series 2. Previously 2.1 cm. The index lesion within the lateral right lobe of liver measures 2 cm, image 51 of series 2. Previously 2.3 cm. Index lesion within the inferior right lobe measures 2.4 cm, image 59 of series 2. Previously 2.6 cm. Gallbladder appears normal. No biliary dilatation. Pancreas: Normal appearance of the pancreas. Spleen: No change in small low density structure within the spleen measuring 6 mm, image 51 of series 2. Adrenals/Urinary Tract: The adrenal glands are normal. There is persistent bilateral pelvocaliectasis. No hydroureter. Enhancing nodule along the anterior wall of the bladder is again  noted, image 81 89 of series 6. Stomach/Bowel: The stomach is normal. The small bowel loops have a normal caliber. No bowel obstruction. No pathologic dilatation of the colon. Increase scratch set there is mild thickening of the appendix which measures 8 mm in diameter, image 77 of series 2. No free fluid or fluid collections noted within in the right lower quadrant. Vascular/Lymphatic: Normal appearance of the abdominal aorta. No enlarged upper abdominal lymph nodes. There is no pelvic or inguinal adenopathy. Reproductive: The uterus appears unremarkable. The right ovary measures 2.8 x 2.0 cm, image 89 of series 2. Previously 2.8 x 1.5 cm. Other: There is a small amount of free fluid identified within the pelvis. This is new from previous exam. Additionally, there is a suggestion subtle soft tissue nodularity within the peritoneal fat. For example, image number 64 of series 2 Musculoskeletal: Diffuse all osseous sclerosis compatible secondary to metastatic disease is again identified. IMPRESSION: 1. Index liver lesions are slightly decreased in size from previous exam. 2. Widespread sclerotic bone metastasis as noted previously. Subjectively there may be an increase in the sclerosis which could reflect healing of bone metastases or progression of sclerotic bone metastases. 3. New ascites identified within the pelvis. Subtle areas of soft tissue nodularity within the peritoneum is concerning for development of peritoneal metastases. 4. Slight enlargement of the right ovary. Attention to the right ovary on follow-up imaging to monitor for potential ovarian metastases. Electronically Signed   By: Kerby Moors M.D.   On: 10/11/2016 11:59   Dg Chest Port 1 View  Result Date: 10/25/2016 CLINICAL DATA:  PICC line placement EXAM: PORTABLE CHEST 1 VIEW COMPARISON:  10/11/2016 chest CT FINDINGS: The heart is normal in size. There is aortic atherosclerosis. Diffuse axial and appendicular skeletal metastasis is noted.  PICC line tip terminates at the cavoatrial junction from a right-sided approach. New car fell linear pulmonary opacities in the right upper lobe cannot exclude pneumonia. The left lung is clear. There is no effusion. IMPRESSION: PICC line tip in the cavoatrial junction. Patchy airspace opacities in the right upper lobe are new and may reflect pneumonia. Diffuse osteoblastic skeletal metastatic disease. Electronically Signed   By: Ashley Royalty M.D.   On: 10/25/2016 22:17   Dg Addison Bailey G Tube Plc W/fl W/rad  Result Date: 10/26/2016 CLINICAL DATA:  NG tube placement EXAM: NASO G TUBE PLACEMENT WITH FL AND WITH RAD FLUOROSCOPY TIME:  Fluoroscopy Time:  6 second Radiation Exposure Index (if provided by the fluoroscopic device): Number of Acquired Spot Images: 0 COMPARISON:  None. FINDINGS: NG tube was placed by the radiologist. Single spot image over the average demonstrates the NG tube tip in the proximal stomach. IMPRESSION: NG tube tip in the proximal stomach. Electronically Signed   By: Rolm Baptise M.D.   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 Radiation discontinued. Continue IV Decadron for pain control. Currently on comfort measures only.  # Pain control-  better controlled on IV morphine drip.  # Discussed with the patient's daughter that the life expectancy is in the order of a few days. They understand.    Cammie Sickle, MD 11/01/2016 1:19 PM

## 2016-11-01 NOTE — Progress Notes (Signed)
While rounding, Elizabeth Mccoy made a follow up visit. Pt was non-responsive. Husband and Sister-in-law were bedside. Husband stated the Pt had a better night. Husband is a peace with decision for Palliative Care. Husbands main concern is that pain is alleviated. Husband is exhausted and will take some recoup time this afternoon. CH is available for follow up as needed.   11/01/16 1100  Clinical Encounter Type  Visited With Patient;Patient and family together  Visit Type Follow-up;Spiritual support  Referral From Nurse  Spiritual Encounters  Spiritual Needs Prayer;Emotional;Grief support

## 2016-11-01 NOTE — Progress Notes (Signed)
[place] [facility] [place] NAME: Elizabeth Mccoy    MRN#:  [id number]  DATE OF BIRTH:  [birth year]  SUBJECTIVE:  Hospital Day: 11 days Elizabeth Mccoy is a 60 y.o. female presenting with Nausea and Dizziness .   Overnight events: No acute overnight events Interval Events: Patient unable to provide information given mental status/medical condition. Family at bedside, patient lethargic intermittently moaning-on morphine drip   REVIEW OF SYSTEMS:  Unable to obtain given patient's medical condition  DRUG ALLERGIES:   Allergies  Allergen Reactions  . Beef Extract Anaphylaxis  . Pork (Porcine) Protein Anaphylaxis  . Beef-Derived Products Hives    VITALS:  Blood pressure (!) 159/91, pulse (!) 102, temperature 97.5 F (36.4 C), temperature source Oral, resp. rate 13, height 5\' 1"  (1.549 m), weight 55.8 kg (123 lb 1.6 oz), SpO2 96 %.  PHYSICAL EXAMINATION:   VITAL SIGNS: Vitals:   10/31/16 0445 11/01/16 0413  BP: (!) 152/91 (!) 159/91  Pulse: 86 (!) 102  Resp: 16 13  Temp: 97.6 F (36.4 C) 97.5 F (36.4 C)   GENERAL:60 y.o.female moderate distress given mental status. Chronically ill, weak appearing HEAD: Normocephalic, atraumatic.  EYES: Pupils equal, round, reactive to light. Unable to assess extraocular muscles given mental status/medical condition. No scleral icterus.  MOUTH: Moist mucosal membrane. Dentition intact. No abscess noted.  EAR, NOSE, THROAT: Clear without exudates. No external lesions.  NECK: Supple. No thyromegaly. No nodules. No JVD.  PULMONARY: Clear to ascultation, without wheeze rails or rhonci. No use of accessory muscles, Good respiratory effort. good air entry bilaterally CHEST: Nontender to palpation.  CARDIOVASCULAR: S1 and S2. Regular rate and rhythm. No murmurs, rubs, or gallops. No edema. Pedal pulses 2+ bilaterally.  GASTROINTESTINAL: Soft, nontender, nondistended. No masses. Positive bowel sounds.  No hepatosplenomegaly.  MUSCULOSKELETAL: No swelling, clubbing, or edema. Range of motion full in all extremities.  NEUROLOGIC: Unable to assess given mental status/medical condition SKIN: No ulceration, lesions, rashes, or cyanosis. Skin warm and dry. Turgor intact.  PSYCHIATRIC: Unable to assess given mental status/medical condition      LABORATORY PANEL:   CBC  Recent Labs Lab 10/26/16 0356  WBC 14.3*  HGB 12.0  HCT 35.6  PLT 150   ------------------------------------------------------------------------------------------------------------------  Chemistries   Recent Labs Lab 10/26/16 0356  10/31/16 0543  NA 130*  < > 126*  K 3.9  < > 4.6  CL 101  < > 97*  CO2 21*  < > 21*  GLUCOSE 133*  < > 131*  BUN 13  < > 9  CREATININE 0.37*  < > <0.30*  CALCIUM 6.9*  < > 6.2*  MG 2.4  < > 1.9  AST 85*  --   --   ALT 217*  --   --   ALKPHOS 164*  --   --   BILITOT 2.7*  --   --   < > = values in this interval not displayed. ------------------------------------------------------------------------------------------------------------------  Cardiac Enzymes No results for input(s): TROPONINI in the last 168 hours. ------------------------------------------------------------------------------------------------------------------  RADIOLOGY:  No results found.  EKG:   Orders placed or performed in visit on 10/21/16  . EKG 12-Lead    ASSESSMENT AND PLAN:   Elizabeth Mccoy is a 60 y.o. female presenting with Nausea and Dizziness . Admitted 10/21/2016 : Day #: 11 days 1. 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 Aspiration pneumonia 3. 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 Management plans discussed with the patient, family and they are in agreement.  CODE STATUS: dnr TOTAL TIME TAKING CARE OF THIS PATIENT: 28 minutes.   POSSIBLE D/C IN 1-2DAYS, DEPENDING ON CLINICAL CONDITION.   Hower,  Karenann Cai.D on 11/01/2016 at 11:28 AM  Between 7am to 6pm - Pager - [phone]  After 6pm: House Pager: - [phone]  [facility]  Office  [phone]  CC: Primary care physician; Gerilyn Pilgrim, FNP

## 2016-11-01 NOTE — Plan of Care (Signed)
Problem: Education: Goal: Knowledge of [facility] Education information/materials will improve Outcome: Not Progressing edu to family  Problem: Pain Managment: Goal: General experience of comfort will improve Outcome: Progressing On morphine drip now. Dose increasde today. Fanily at bedside

## 2016-11-02 NOTE — Progress Notes (Signed)
Daily Progress Note   Patient Name: Elizabeth Mccoy       Date: 11/02/2016 DOB: 02-21-1956  Age: 60 y.o. MRN#: [id number] Attending Physician: Lytle Butte, MD Primary Care Physician: Gerilyn Pilgrim, FNP Admit Date: 10/21/2016  Reason for Consultation/Follow-up: Establishing goals of care  Subjective: Patient wakes easily to voice. Says she is at home. Irregular respirations with periods of apnea. Morphine 3mg  continuous infusion. Husband at bedside. Answered questions and offered support. He asks about how long? He understands anything could happen at any time, could be hours or a few days.   Length of Stay: 12  Current Medications: Scheduled Meds:  . dexamethasone  4 mg Intravenous Q6H  . haloperidol lactate  2.5 mg Intramuscular Once    Continuous Infusions: . sodium chloride 10 mL/hr at 11/02/16 0820  . morphine 3 mg/hr (11/02/16 0030)    PRN Meds: glycopyrrolate, LORazepam, morphine, ondansetron (ZOFRAN) IV, promethazine  Physical Exam  Constitutional: She appears cachectic. She appears ill.  Confused. One word answers to questions.   Eyes:  blindness  Cardiovascular: Regular rhythm and normal heart sounds.   Pulmonary/Chest: Accessory muscle usage present. She has rhonchi.  Irregular, brief episodes of apnea.  Abdominal: Soft. Bowel sounds are decreased.  Neurological: She is alert. She is disoriented.  Skin: Skin is warm and dry. There is pallor.  No mottling  Psychiatric: Her speech is delayed. Cognition and memory are impaired.  Intermittent moaning She is inattentive.  Nursing note and vitals reviewed.          Vital Signs: BP (!) 162/89 (BP Location: Left Arm)   Pulse (!) 108   Temp 97.9 F (36.6 C) (Oral)   Resp (!) 8   Ht 5\' 1"  (1.549 m)   Wt  55.8 kg (123 lb 1.6 oz)   LMP  (Approximate)   SpO2 94%   BMI 23.26 kg/m  SpO2: SpO2: 94 % O2 Device: O2 Device: Not Delivered O2 Flow Rate:    Intake/output summary:   Intake/Output Summary (Last 24 hours) at 11/02/16 0913 Last data filed at 11/02/16 0405  Gross per 24 hour  Intake           1482.2 ml  Output              650 ml  Net  832.2 ml   LBM: Last BM Date: 10/24/16 Baseline Weight: Weight: 55.8 kg (123 lb) Most recent weight: Weight: 55.8 kg (123 lb 1.6 oz)       Palliative Assessment/Data: PPS 10%   Flowsheet Rows   Flowsheet Row Most Recent Value  Intake Tab  Referral Department  Oncology  Unit at Time of Referral  Oncology Unit  Palliative Care Primary Diagnosis  Cancer  Date Notified  10/26/16  Palliative Care Type  New Palliative care  Reason for referral  Clarify Goals of Care  Date of Admission  10/21/16  Date first seen by Palliative Care  10/29/16  # of days IP prior to Palliative referral  5  Clinical Assessment  Palliative Performance Scale Score  10%  Psychosocial & Spiritual Assessment  Palliative Care Outcomes  Patient/Family meeting held?  Yes  Who was at the meeting?  husband  Palliative Care Outcomes  Provided end of life care assistance, Provided psychosocial or spiritual support, Counseled regarding hospice      Patient Active Problem List   Diagnosis Date Noted  . Comfort measures only status   . Dyspnea   . Thrush, oral   . Palliative care encounter   . Encounter for hospice care discussion   . Protein-calorie malnutrition, severe 10/23/2016  . Intractable nausea and vomiting 10/21/2016  . 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 Carcinoma of overlapping sites of right breast in female, estrogen receptor positive ([place]) 10/10/2016  . Pancytopenia, acquired ([place]) 09/05/2016  . Chest wall discomfort 09/05/2016  . Metastases to the liver ([place]) 09/05/2016  . Metastatic cancer ([place])  06/27/2016  . Malignant neoplasm of overlapping sites of right breast ([place]) 06/15/2016  . Menopause     Palliative Care Assessment & Plan   Patient Profile: 60 y.o. female  with past medical history of breast cancer with metastases to liver, bone, and brain including leptomeningeal metastasis admitted on 10/21/2016 with poor PO intake, dehydration, nausea, and weakness. Worsening headache and nausea from leptomeningeal mets with new onset blindness. Patient receiving steroids and radiation. Also with elevated LFT's likely due to disease progression per Dr. Rogue Bussing. Poor nutrition-patient is not a candidate for TPN and NGT is out. Very poor prognosis. Transitioned to comfort measures on 11/22.   Assessment: Breast cancer with liver, bone, and brain metastases Leptomeningeal metastasis Pain Nausea Thrush  Recommendations/Plan:  Continue morphine infusion and prn ativan/robinul.  IVF now [facility]  Discussed with Dr. Lavetta Nielsen. Unstable for transfer home with hospice services. Anticipate hospital death.    Code Status: DNR   Code Status Orders        Start     Ordered   10/27/16 1110  Do not attempt resuscitation (DNR)  Continuous    Question Answer Comment  In the event of cardiac or respiratory ARREST Do not call a "code blue"   In the event of cardiac or respiratory ARREST Do not perform Intubation, CPR, defibrillation or ACLS   In the event of cardiac or respiratory ARREST Use medication by any route, position, wound care, and other measures to relive pain and suffering. May use oxygen, suction and manual treatment of airway obstruction as needed for comfort.   Comments NURSE MAY PRONOUNCE.      10/27/16 1110    Code Status History    Date Active Date Inactive Code Status Order ID Comments User Context   10/27/2016 11:01 AM 10/27/2016 11:10 AM DNR [id number]  Lloyd Huger, MD Inpatient  10/21/2016  8:03 PM 10/27/2016 11:01 AM Full Code [id number]  Baxter Hire, MD  Inpatient       Prognosis:   Hours - Days Metastatic breast cancer to bone, liver, and leptomeningeal involvement. Severe nutrition and functional status decline.   Discharge Planning:  Anticipated Hospital Death    Care plan was discussed with husband, RN, and Dr. Lavetta Nielsen  Thank you for allowing the Palliative Medicine Team to assist in the care of this patient.   Time In: 0820 Time Out: 0855 Total Time 41min Prolonged Time Billed  no      Greater than 50%  of this time was spent counseling and coordinating care related to the above assessment and plan.  Ihor Dow, FNP-C Palliative Medicine Team  Phone: [phone] Fax: [phone] Cell: [phone]  Please contact Palliative Medicine Team phone at [phone] for questions and concerns.

## 2016-11-02 NOTE — Progress Notes (Signed)
[place] at [place] NAME: Elizabeth Mccoy    MRN#:  [id number]  DATE OF BIRTH:  1956-02-18  SUBJECTIVE:  Hospital Day: 12 days Elizabeth Mccoy is a 60 y.o. female presenting with Nausea and Dizziness .   Overnight events: No acute overnight events Interval Events: Patient unable to provide information given mental status/medical condition. Family at bedside, patient lethargic intermittently moaning-on morphine drip   REVIEW OF SYSTEMS:  Unable to obtain given patient's medical condition  DRUG ALLERGIES:   Allergies  Allergen Reactions  . Beef Extract Anaphylaxis  . Pork (Porcine) Protein Anaphylaxis  . Beef-Derived Products Hives    VITALS:  Blood pressure (!) 162/89, pulse (!) 108, temperature 97.9 F (36.6 C), temperature source Oral, resp. rate (!) 8, height 5\' 1"  (1.549 m), weight 55.8 kg (123 lb 1.6 oz), SpO2 94 %.  PHYSICAL EXAMINATION:   VITAL SIGNS: Vitals:   11/01/16 0413 11/02/16 0405  BP: (!) 159/91 (!) 162/89  Pulse: (!) 102 (!) 108  Resp: 13 (!) 8  Temp: 97.5 F (36.4 C) 97.9 F (36.6 C)   GENERAL:60 y.o.female moderate distress given mental status. Chronically ill, weak appearing HEAD: Normocephalic, atraumatic.  EYES: Pupils equal, round, reactive to light. Unable to assess extraocular muscles given mental status/medical condition. No scleral icterus.  MOUTH: Moist mucosal membrane. Dentition intact. No abscess noted.  EAR, NOSE, THROAT: Clear without exudates. No external lesions.  NECK: Supple. No thyromegaly. No nodules. No JVD.  PULMONARY: Clear to ascultation, without wheeze rails or rhonci. No use of accessory muscles, Good respiratory effort. good air entry bilaterally CHEST: Nontender to palpation.  CARDIOVASCULAR: S1 and S2. Regular rate and rhythm. No murmurs, rubs, or gallops. No edema. Pedal pulses 2+ bilaterally.  GASTROINTESTINAL: Soft, nontender, nondistended. No masses. Positive  bowel sounds. No hepatosplenomegaly.  MUSCULOSKELETAL: No swelling, clubbing, or edema. Range of motion full in all extremities.  NEUROLOGIC: Unable to assess given mental status/medical condition SKIN: No ulceration, lesions, rashes, or cyanosis. Skin warm and dry. Turgor intact.  PSYCHIATRIC: Unable to assess given mental status/medical condition      LABORATORY PANEL:   CBC No results for input(s): WBC, HGB, HCT, PLT in the last 168 hours. ------------------------------------------------------------------------------------------------------------------  Chemistries   Recent Labs Lab 10/31/16 0543  NA 126*  K 4.6  CL 97*  CO2 21*  GLUCOSE 131*  BUN 9  CREATININE <0.30*  CALCIUM 6.2*  MG 1.9   ------------------------------------------------------------------------------------------------------------------  Cardiac Enzymes No results for input(s): TROPONINI in the last 168 hours. ------------------------------------------------------------------------------------------------------------------  RADIOLOGY:  No results found.  EKG:   Orders placed or performed in visit on 10/21/16  . EKG 12-Lead    ASSESSMENT AND PLAN:   Elizabeth Mccoy is a 60 y.o. female presenting with Nausea and Dizziness . Admitted 10/21/2016 : Day #: 12 days 1. 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 Aspiration pneumonia 3. 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 Management plans discussed with the patient, family and they are in agreement.  CODE STATUS: dnr TOTAL TIME TAKING CARE OF THIS PATIENT: 28 minutes.   POSSIBLE D/C  IN 1-2DAYS, DEPENDING ON CLINICAL CONDITION.   Broadus Costilla,  Karenann Cai.D on 11/02/2016 at 1:44 PM  Between 7am to 6pm - Pager - [phone]  After 6pm: House Pager: - [facility]  Office  [phone]  CC: Primary care physician; Gerilyn Pilgrim, FNP

## 2016-11-03 MED ORDER — MORPHINE BOLUS VIA INFUSION
1.0000 mg | INTRAVENOUS | Status: DC | PRN
  Administered 2016-11-04 – 2016-11-05 (×7): 1 mg via INTRAVENOUS
  Filled 2016-11-03: qty 1

## 2016-11-03 NOTE — Progress Notes (Signed)
[facility] NOTE  Patient Care Team: Gerilyn Pilgrim, FNP as PCP - General (Nurse Practitioner)  CHIEF COMPLAINTS/PURPOSE OF CONSULTATION:  Metastatic breast cancer/ patient unable to provide any history because of mental status changes/morphine medication  CC: History is provided by patient's husband at the bedside.No acute issues overnight. Pt on the IV morphine drip. "Comfortable"  ROS: Unable to assess  MEDICAL HISTORY:  Past Medical History:  Diagnosis Date  . Breast cancer, right ([place]) 2015   rad tx's on right hip.  . Menopause 12/10/2006   2008    SURGICAL HISTORY: Past Surgical History:  Procedure Laterality Date  . CESAREAN SECTION      SOCIAL HISTORY: Social History   Social History  . Marital status: Married    Spouse name: N/A  . Number of children: 2  . Years of education: N/A   Occupational History  . Not on file.   Social History Main Topics  . Smoking status: Never Smoker  . Smokeless tobacco: Never Used  . Alcohol use No  . Drug use: No  . Sexual activity: Not on file   Other Topics Concern  . Not on file   Social History Narrative  . No narrative on file    FAMILY HISTORY: Family History  Problem Relation Age of Onset  . Cancer Father     prostate ca  . Cancer Sister     cancer unknown primary    ALLERGIES:  is allergic to beef extract; pork (porcine) protein; and beef-derived products.  MEDICATIONS:  Current Facility-Administered Medications  Medication Dose Route Frequency Provider Last Rate Last Dose  . dexamethasone (DECADRON) injection 4 mg  4 mg Intravenous Q6H Baxter Hire, MD   4 mg at 11/03/16 1757  . glycopyrrolate (ROBINUL) injection 0.2 mg  0.2 mg Intravenous Q4H PRN Basilio Cairo, NP   0.2 mg at 11/02/16 0737  . haloperidol lactate (HALDOL) injection 2.5 mg  2.5 mg Intramuscular Once Epifanio Lesches, MD      . LORazepam (ATIVAN) injection 1 mg  1 mg Intravenous Q2H PRN Harrie Foreman, MD    1 mg at 11/03/16 0109  . morphine 100mg  in NS 148mL (1mg /mL) infusion - premix  7 mg/hr Intravenous Continuous Lytle Butte, MD 7 mL/hr at 11/03/16 1619 7 mg/hr at 11/03/16 1619  . morphine bolus via infusion 1 mg  1 mg Intravenous Q15 min PRN Lytle Butte, MD      . ondansetron [facility]) injection 4 mg  4 mg Intravenous Q6H PRN Hillary Bow, MD   4 mg at 10/29/16 0153  . promethazine (PHENERGAN) injection 25 mg  25 mg Intravenous Q6H PRN Epifanio Lesches, MD   25 mg at 10/25/16 1308      .  PHYSICAL EXAMINATION:  Vitals:   11/02/16 0405 11/03/16 0503  BP: (!) 162/89 130/72  Pulse: (!) 108 (!) 110  Resp: (!) 8 (!) 6  Temp: 97.9 F (36.6 C) 98.7 F (37.1 C)   Filed Weights   10/29/16 0500 10/30/16 0453 10/31/16 0500  Weight: 119 lb (54 kg) 120 lb 3.2 oz (54.5 kg) 123 lb 1.6 oz (55.8 kg)    GENERAL: Moderate built moderately nourished female patient resting in the bed. Resting comfortably Accompanied by her husband.  EYES: no pallor or icterus OROPHARYNX: Cannot be examined felt LUNGS: decreased breath sounds to auscultation at bases and  No wheeze or crackles HEART/CVS: regular rate & rhythm and no murmurs; No lower  extremity edema ABDOMEN: abdomen soft, non-tender and normal bowel sounds Musculoskeletal:no cyanosis of digits and no clubbing  PSYCH:  drowsy ; not arousable  NEURO:  cannot be performed  SKIN:  no rashes or significant lesions  LABORATORY DATA:  I have reviewed the data as listed Lab Results  Component Value Date   WBC 14.3 (H) 10/26/2016   HGB 12.0 10/26/2016   HCT 35.6 10/26/2016   MCV 95.2 10/26/2016   PLT 150 10/26/2016    Recent Labs  10/21/16 1458  10/23/16 0457 10/26/16 0356  10/29/16 0528 10/30/16 0455 10/31/16 0543  NA 127*  < > 133* 130*  < > 129* 132* 126*  K 4.4  < > 4.4 3.9  < > 4.2 4.1 4.6  CL 97*  < > 104 101  < > 101 102 97*  CO2 19*  < > 21* 21*  < > 20* 21* 21*  GLUCOSE 117*  < > 135* 133*  < > 121* 125* 131*  BUN 17  <  > 12 13  < > 12 11 9   CREATININE 0.48  < > 0.40* 0.37*  < > <0.30* 0.33* <0.30*  CALCIUM 7.8*  < > 7.4* 6.9*  < > 6.1* 6.0* 6.2*  GFRNONAA >60  < > >60 >60  < > NOT CALCULATED >60 NOT CALCULATED  GFRAA >60  < > >60 >60  < > NOT CALCULATED >60 NOT CALCULATED  PROT 7.1  --  6.3* 6.3*  --   --   --   --   ALBUMIN 4.2  --  3.7 3.5  --   --  3.1*  --   AST 54*  --  55* 85*  --   --   --   --   ALT 64*  --  81* 217*  --   --   --   --   ALKPHOS 229*  --  164* 164*  --   --   --   --   BILITOT 1.0  --  0.8 2.7*  --   --   --   --   BILIDIR  --   --   --  1.4*  --   --   --   --   IBILI  --   --   --  1.3*  --   --   --   --   < > = values in this interval not displayed.  RADIOGRAPHIC STUDIES: I have personally reviewed the radiological images as listed and agreed with the findings in the report. Ct Head Wo Contrast  Result Date: 10/21/2016 CLINICAL DATA:  Metastatic breast cancer. EXAM: CT HEAD WITHOUT CONTRAST TECHNIQUE: Contiguous axial images were obtained from the base of the skull through the vertex without intravenous contrast. COMPARISON:  MRI head 10/12/2016 FINDINGS: Brain: Progressive ventricular dilatation since the prior MRI. This is most consistent with hydrocephalus in this patient with metastatic disease and may be due to leptomeningeal tumor. No obstructing mass lesion. Multiple small metastatic deposits are seen throughout the brain on the MRI. These are not well seen on unenhanced CT however there is mild edema in the right occipital lobe and in the right frontal lobe over the convexity consistent with metastatic disease. Negative for hemorrhage. Vascular: Negative for dense MCA Skull: Patchy sclerotic disease in the clivus compatible with metastatic disease. No destructive skull lesion. Sinuses/Orbits: Infiltration of the orbit with enlargement of the extra-ocular muscles most consistent with metastatic disease to the  orbit as identified on MRI. Other: None IMPRESSION: Progressive  ventricular dilatation since the recent MRI compatible with hydrocephalus. 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 Multiple brain metastasis better seen on MRI. No large obstructing mass lesion is identified. Negative for hemorrhage Metastatic disease to the clivus. Metastatic disease to the orbit bilaterally. Electronically Signed   By: Franchot Gallo M.D.   On: 10/21/2016 17:22   Ct Chest W Contrast  Result Date: 10/11/2016 CLINICAL DATA:  Metastatic breast cancer. EXAM: CT CHEST, ABDOMEN, AND PELVIS WITH CONTRAST TECHNIQUE: Multidetector CT imaging of the chest, abdomen and pelvis was performed following the standard protocol during bolus administration of intravenous contrast. CONTRAST:  152mL ISOVUE-300 IOPAMIDOL (ISOVUE-300) INJECTION 61% COMPARISON:  08/29/2016 FINDINGS: CT CHEST FINDINGS Cardiovascular: The heart size appears normal. Aortic atherosclerosis noted. Mediastinum/Nodes: The trachea appears patent and is midline. Normal appearance of the esophagus. Right axillary node measures 9 mm, image number 18 of series 2. Previously this measured the same. Interval decrease in soft tissue stranding within the right axilla which may be related to prior instrumentation. No enlarged mediastinal or hilar nodes. Lungs/Pleura: Small right pleural effusion. Mild diffuse bronchial wall thickening noted. Left upper lobe pulmonary nodule is unchanged measuring 4 mm, image 56 of series 4. Perifissural nodule in the left lower lobe is stable measuring 4 mm, image number 80 of series 4. No new or enlarging pulmonary nodules identified Musculoskeletal: Extensive, widespread sclerotic bone metastases again noted. Subjectively increased to previous exam. CT ABDOMEN PELVIS FINDINGS Hepatobiliary: Multifocal liver metastases again identified. Index lesion within the right lobe measures 2.6 cm, image 41 of series 2. Previously this measured the same. Lateral segment of left lobe of  liver lesion measures 1.5 cm, image 49 of series 2. Previously 2.1 cm. The index lesion within the lateral right lobe of liver measures 2 cm, image 51 of series 2. Previously 2.3 cm. Index lesion within the inferior right lobe measures 2.4 cm, image 59 of series 2. Previously 2.6 cm. Gallbladder appears normal. No biliary dilatation. Pancreas: Normal appearance of the pancreas. Spleen: No change in small low density structure within the spleen measuring 6 mm, image 51 of series 2. Adrenals/Urinary Tract: The adrenal glands are normal. There is persistent bilateral pelvocaliectasis. No hydroureter. Enhancing nodule along the anterior wall of the bladder is again noted, image 81 89 of series 6. Stomach/Bowel: The stomach is normal. The small bowel loops have a normal caliber. No bowel obstruction. No pathologic dilatation of the colon. Increase scratch set there is mild thickening of the appendix which measures 8 mm in diameter, image 77 of series 2. No free fluid or fluid collections noted within in the right lower quadrant. Vascular/Lymphatic: Normal appearance of the abdominal aorta. No enlarged upper abdominal lymph nodes. There is no pelvic or inguinal adenopathy. Reproductive: The uterus appears unremarkable. The right ovary measures 2.8 x 2.0 cm, image 89 of series 2. Previously 2.8 x 1.5 cm. Other: There is a small amount of free fluid identified within the pelvis. This is new from previous exam. Additionally, there is a suggestion subtle soft tissue nodularity within the peritoneal fat. For example, image number 64 of series 2 Musculoskeletal: Diffuse all osseous sclerosis compatible secondary to metastatic disease is again identified. IMPRESSION: 1. Index liver lesions are slightly decreased in size from previous exam. 2. Widespread sclerotic bone metastasis as noted previously. Subjectively there may be an increase in the sclerosis which could reflect healing of bone  metastases or progression of sclerotic  bone metastases. 3. New ascites identified within the pelvis. Subtle areas of soft tissue nodularity within the peritoneum is concerning for development of peritoneal metastases. 4. Slight enlargement of the right ovary. Attention to the right ovary on follow-up imaging to monitor for potential ovarian metastases. Electronically Signed   By: Kerby Moors M.D.   On: 10/11/2016 11:59   Mr Jeri Cos [id number] Contrast  Result Date: 10/12/2016 CLINICAL DATA:  60 year old female with metastatic breast cancer. Staging. Subsequent encounter. EXAM: MRI HEAD WITHOUT AND WITH CONTRAST TECHNIQUE: Multiplanar, multiecho pulse sequences of the brain and surrounding structures were obtained without and with intravenous contrast. CONTRAST:  35mL MULTIHANCE GADOBENATE DIMEGLUMINE 529 MG/ML IV SOLN COMPARISON:  PET-CT 11/14/2015. FINDINGS: Brain: Numerous small brain metastases. There are at least 15 small supratentorial metastases ranging from punctate to 10 mm diameter. There is also extensive metastatic disease affecting the cerebellum with widespread small areas of abnormal enhancement, many of which are suspicious for leptomeningeal disease (series 16 images 14 and 15). The largest individual cerebellar lesions are 8-9 mm. Despite the extensive disease there is only occasional mild cerebral and cerebellar edema, and no significant intracranial mass effect. No hemorrhagic metastases. No abnormal ependymoma 1 enhancement. No pachymeningeal thickening or enhancement. No restricted diffusion or evidence of acute infarction. No ventriculomegaly. No acute intracranial hemorrhage identified. Negative pituitary. Negative cervicomedullary junction. No chronic cerebral blood products identified. There are small chronic lacunar infarcts in the left cerebellum. Subtle chronic cortical encephalomalacia in the right parietal lobe (series 9, image 21) compatible with previous small posterior right MCA infarct. Vascular: Major intracranial  vascular flow voids are preserved, the distal left vertebral artery appears dominant. Skull and upper cervical spine: Diffuse abnormal bone marrow signal throughout the cervical spine and at the skullbase in keeping with widespread osseous metastatic disease. No destructive calvarial lesion identified. Negative visualized cervical spinal cord. Sinuses/Orbits: Abnormal heterogeneous appearance of the bilateral intraorbital soft tissues including the right lateral rectus muscle which is expanded in T2 hyperintense (series 14, image 20 and series 9, image 9). Similar heterogeneity throughout the left orbital soft tissues. There seems to be a mild degree of enophthalmos. The globes appear remain intact. Trace paranasal sinus mucosal thickening. Other: Mastoids are clear. Visible internal auditory structures appear normal. Negative scalp soft tissues. IMPRESSION: 1. Widespread metastatic disease to the brain. At least 15 small supratentorial metastases ranging from punctate to 10 mm diameter, and extensive cerebellar metastatic disease which appears highly suspicious for leptomeningeal metastases. 2. Only mild scattered brain edema and no significant intracranial mass effect. 3. Bilateral orbit soft tissue metastases (scirrhous infiltration of the orbits). 4. Widespread osseous metastatic disease. Electronically Signed   By: Genevie Ann M.D.   On: 10/12/2016 13:18   Mr Thoracic Spine W Wo Contrast  Result Date: 10/23/2016 CLINICAL DATA:  Metastatic breast cancer. EXAM: MRI THORACIC AND LUMBAR SPINE WITHOUT AND WITH CONTRAST TECHNIQUE: Multiplanar and multiecho pulse sequences of the thoracic and lumbar spine were obtained without and with intravenous contrast. CONTRAST:  73mL MULTIHANCE GADOBENATE DIMEGLUMINE 529 MG/ML IV SOLN COMPARISON:  Bone scan 08/29/2016 FINDINGS: MRI THORACIC SPINE FINDINGS Alignment:  Physiologic. Vertebrae: Heterogeneous marrow diffusely from extensive metastatic disease. Every thoracic  vertebra is involved. No extraosseous tumor growth is noted in the epidural or foraminal spaces. No pathologic fracture. Cord: The cord has normal signal, but there is diffuse surface enhancement of the cord and intrathecal roots. Paraspinal and other soft tissues: Known hepatic metastatic disease.  Trace right pleural effusion. Disc levels: No degenerative impingement MRI LUMBAR SPINE FINDINGS Segmentation:  Standard. Alignment:  Physiologic. Vertebrae: Metastases throughout all lumbar levels. No pathologic fracture or epidural/ foraminal infiltration. Conus medullaris: Extends to the T12 level and appears normal. There is diffuse thickening enhancement of the cauda equina Paraspinal and other soft tissues: The bladder is over distended and there is bilateral moderate hydroureteronephrosis. Disc levels: Lower lumbar facet arthropathy.  No degenerative impingement. These results will be called to the ordering clinician or representative by the Radiologist Assistant, and communication documented in the PACS or zVision Dashboard. IMPRESSION: 1. 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 2. Widespread osseous metastatic disease without pathologic fracture or detected epidural infiltration. 3. Over distended bladder with bilateral hydronephrosis, correlate for retention related to #1. 4. Known hepatic metastatic disease. Electronically Signed   By: Monte Fantasia M.D.   On: 10/23/2016 13:37   Mr Lumbar Spine W Wo Contrast  Result Date: 10/23/2016 CLINICAL DATA:  Metastatic breast cancer. EXAM: MRI THORACIC AND LUMBAR SPINE WITHOUT AND WITH CONTRAST TECHNIQUE: Multiplanar and multiecho pulse sequences of the thoracic and lumbar spine were obtained without and with intravenous contrast. CONTRAST:  53mL MULTIHANCE GADOBENATE DIMEGLUMINE 529 MG/ML IV SOLN COMPARISON:  Bone scan 08/29/2016 FINDINGS: MRI THORACIC SPINE FINDINGS Alignment:   Physiologic. Vertebrae: Heterogeneous marrow diffusely from extensive metastatic disease. Every thoracic vertebra is involved. No extraosseous tumor growth is noted in the epidural or foraminal spaces. No pathologic fracture. Cord: The cord has normal signal, but there is diffuse surface enhancement of the cord and intrathecal roots. Paraspinal and other soft tissues: Known hepatic metastatic disease. Trace right pleural effusion. Disc levels: No degenerative impingement MRI LUMBAR SPINE FINDINGS Segmentation:  Standard. Alignment:  Physiologic. Vertebrae: Metastases throughout all lumbar levels. No pathologic fracture or epidural/ foraminal infiltration. Conus medullaris: Extends to the T12 level and appears normal. There is diffuse thickening enhancement of the cauda equina Paraspinal and other soft tissues: The bladder is over distended and there is bilateral moderate hydroureteronephrosis. Disc levels: Lower lumbar facet arthropathy.  No degenerative impingement. These results will be called to the ordering clinician or representative by the Radiologist Assistant, and communication documented in the PACS or zVision Dashboard. IMPRESSION: 1. 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 2. Widespread osseous metastatic disease without pathologic fracture or detected epidural infiltration. 3. Over distended bladder with bilateral hydronephrosis, correlate for retention related to #1. 4. Known hepatic metastatic disease. Electronically Signed   By: Monte Fantasia M.D.   On: 10/23/2016 13:37   Ct Abdomen Pelvis W Contrast  Result Date: 10/11/2016 CLINICAL DATA:  Metastatic breast cancer. EXAM: CT CHEST, ABDOMEN, AND PELVIS WITH CONTRAST TECHNIQUE: Multidetector CT imaging of the chest, abdomen and pelvis was performed following the standard protocol during bolus administration of intravenous contrast. CONTRAST:  133mL ISOVUE-300 IOPAMIDOL  (ISOVUE-300) INJECTION 61% COMPARISON:  08/29/2016 FINDINGS: CT CHEST FINDINGS Cardiovascular: The heart size appears normal. Aortic atherosclerosis noted. Mediastinum/Nodes: The trachea appears patent and is midline. Normal appearance of the esophagus. Right axillary node measures 9 mm, image number 18 of series 2. Previously this measured the same. Interval decrease in soft tissue stranding within the right axilla which may be related to prior instrumentation. No enlarged mediastinal or hilar nodes. Lungs/Pleura: Small right pleural effusion. Mild diffuse bronchial wall thickening noted. Left upper lobe pulmonary nodule is unchanged measuring 4 mm, image 56 of series  4. Perifissural nodule in the left lower lobe is stable measuring 4 mm, image number 80 of series 4. No new or enlarging pulmonary nodules identified Musculoskeletal: Extensive, widespread sclerotic bone metastases again noted. Subjectively increased to previous exam. CT ABDOMEN PELVIS FINDINGS Hepatobiliary: Multifocal liver metastases again identified. Index lesion within the right lobe measures 2.6 cm, image 41 of series 2. Previously this measured the same. Lateral segment of left lobe of liver lesion measures 1.5 cm, image 49 of series 2. Previously 2.1 cm. The index lesion within the lateral right lobe of liver measures 2 cm, image 51 of series 2. Previously 2.3 cm. Index lesion within the inferior right lobe measures 2.4 cm, image 59 of series 2. Previously 2.6 cm. Gallbladder appears normal. No biliary dilatation. Pancreas: Normal appearance of the pancreas. Spleen: No change in small low density structure within the spleen measuring 6 mm, image 51 of series 2. Adrenals/Urinary Tract: The adrenal glands are normal. There is persistent bilateral pelvocaliectasis. No hydroureter. Enhancing nodule along the anterior wall of the bladder is again noted, image 81 89 of series 6. Stomach/Bowel: The stomach is normal. The small bowel loops have a  normal caliber. No bowel obstruction. No pathologic dilatation of the colon. Increase scratch set there is mild thickening of the appendix which measures 8 mm in diameter, image 77 of series 2. No free fluid or fluid collections noted within in the right lower quadrant. Vascular/Lymphatic: Normal appearance of the abdominal aorta. No enlarged upper abdominal lymph nodes. There is no pelvic or inguinal adenopathy. Reproductive: The uterus appears unremarkable. The right ovary measures 2.8 x 2.0 cm, image 89 of series 2. Previously 2.8 x 1.5 cm. Other: There is a small amount of free fluid identified within the pelvis. This is new from previous exam. Additionally, there is a suggestion subtle soft tissue nodularity within the peritoneal fat. For example, image number 64 of series 2 Musculoskeletal: Diffuse all osseous sclerosis compatible secondary to metastatic disease is again identified. IMPRESSION: 1. Index liver lesions are slightly decreased in size from previous exam. 2. Widespread sclerotic bone metastasis as noted previously. Subjectively there may be an increase in the sclerosis which could reflect healing of bone metastases or progression of sclerotic bone metastases. 3. New ascites identified within the pelvis. Subtle areas of soft tissue nodularity within the peritoneum is concerning for development of peritoneal metastases. 4. Slight enlargement of the right ovary. Attention to the right ovary on follow-up imaging to monitor for potential ovarian metastases. Electronically Signed   By: Kerby Moors M.D.   On: 10/11/2016 11:59   Dg Chest Port 1 View  Result Date: 10/25/2016 CLINICAL DATA:  PICC line placement EXAM: PORTABLE CHEST 1 VIEW COMPARISON:  10/11/2016 chest CT FINDINGS: The heart is normal in size. There is aortic atherosclerosis. Diffuse axial and appendicular skeletal metastasis is noted. PICC line tip terminates at the cavoatrial junction from a right-sided approach. New car fell linear  pulmonary opacities in the right upper lobe cannot exclude pneumonia. The left lung is clear. There is no effusion. IMPRESSION: PICC line tip in the cavoatrial junction. Patchy airspace opacities in the right upper lobe are new and may reflect pneumonia. Diffuse osteoblastic skeletal metastatic disease. Electronically Signed   By: Ashley Royalty M.D.   On: 10/25/2016 22:17   Dg Addison Bailey G Tube Plc W/fl W/rad  Result Date: 10/26/2016 CLINICAL DATA:  NG tube placement EXAM: NASO G TUBE PLACEMENT WITH FL AND WITH RAD FLUOROSCOPY TIME:  Fluoroscopy Time:  6 second Radiation Exposure Index (if provided by the fluoroscopic device): Number of Acquired Spot Images: 0 COMPARISON:  None. FINDINGS: NG tube was placed by the radiologist. Single spot image over the average demonstrates the NG tube tip in the proximal stomach. IMPRESSION: NG tube tip in the proximal stomach. Electronically Signed   By: Rolm Baptise M.D.   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 Radiation discontinued. Continue comfort measures. We will discontinue IV Decadron.  # Pain control-  better controlled on IV morphine drip.  # Discussed with the patient's husband that the life expectancy is in the order of a few days. They understand. If patient continues to survive over the weekend- disposition to [facility].    Cammie Sickle, MD 11/03/2016 6:49 PM

## 2016-11-03 NOTE — Progress Notes (Signed)
[facility] at [address] NAME: Elizabeth Mccoy    MRN#:  [id number]  DATE OF BIRTH:  1956/04/28  SUBJECTIVE:  Hospital Day: 13 days Elizabeth Mccoy is a 60 y.o. female presenting with Nausea and Dizziness .   Overnight events: No acute overnight events Interval Events: Patient unable to provide information given mental status/medical condition. Family at bedside, patient lethargic intermittently moaning-on morphine drip   REVIEW OF SYSTEMS:  Unable to obtain given patient's medical condition  DRUG ALLERGIES:   Allergies  Allergen Reactions  . Beef Extract Anaphylaxis  . Pork (Porcine) Protein Anaphylaxis  . Beef-Derived Products Hives    VITALS:  Blood pressure 130/72, pulse (!) 110, temperature 98.7 F (37.1 C), temperature source Oral, resp. rate (!) 6, height 5\' 1"  (1.549 m), weight 55.8 kg (123 lb 1.6 oz), SpO2 93 %.  PHYSICAL EXAMINATION:   VITAL SIGNS: Vitals:   11/02/16 0405 11/03/16 0503  BP: (!) 162/89 130/72  Pulse: (!) 108 (!) 110  Resp: (!) 8 (!) 6  Temp: 97.9 F (36.6 C) 98.7 F (37.1 C)   GENERAL:60 y.o.female moderate distress given mental status. Chronically ill, weak appearing HEAD: Normocephalic, atraumatic.  EYES: Pupils equal, round, reactive to light. Unable to assess extraocular muscles given mental status/medical condition. No scleral icterus.  MOUTH: Moist mucosal membrane. Dentition intact. No abscess noted.  EAR, NOSE, THROAT: Clear without exudates. No external lesions.  NECK: Supple. No thyromegaly. No nodules. No JVD.  PULMONARY: Clear to ascultation, without wheeze rails or rhonci. No use of accessory muscles, Good respiratory effort. good air entry bilaterally CHEST: Nontender to palpation.  CARDIOVASCULAR: S1 and S2. Regular rate and rhythm. No murmurs, rubs, or gallops. No edema. Pedal pulses 2+ bilaterally.  GASTROINTESTINAL: Soft, nontender, nondistended. No masses. Positive bowel  sounds. No hepatosplenomegaly.  MUSCULOSKELETAL: No swelling, clubbing, or edema. Range of motion full in all extremities.  NEUROLOGIC: Unable to assess given mental status/medical condition SKIN: No ulceration, lesions, rashes, or cyanosis. Skin warm and dry. Turgor intact.  PSYCHIATRIC: Unable to assess given mental status/medical condition      LABORATORY PANEL:   CBC No results for input(s): WBC, HGB, HCT, PLT in the last 168 hours. ------------------------------------------------------------------------------------------------------------------  Chemistries   Recent Labs Lab 10/31/16 0543  NA 126*  K 4.6  CL 97*  CO2 21*  GLUCOSE 131*  BUN 9  CREATININE <0.30*  CALCIUM 6.2*  MG 1.9   ------------------------------------------------------------------------------------------------------------------  Cardiac Enzymes No results for input(s): TROPONINI in the last 168 hours. ------------------------------------------------------------------------------------------------------------------  RADIOLOGY:  No results found.  EKG:   Orders placed or performed in visit on 10/21/16  . EKG 12-Lead    ASSESSMENT AND PLAN:   Elizabeth Mccoy is a 60 y.o. female presenting with Nausea and Dizziness . Admitted 10/21/2016 : Day #: 13 days 1. 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 Aspiration pneumonia 3. 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 Management plans discussed with the patient, family  and they are in agreement.  CODE STATUS: dnr TOTAL TIME  TAKING CARE OF THIS PATIENT: 28 minutes.   POSSIBLE D/C IN 1-2DAYS, DEPENDING ON CLINICAL CONDITION.   Devota Viruet,  Karenann Cai.D on 11/03/2016 at 11:46 AM  Between 7am to 6pm - Pager - [phone]  After 6pm: House Pager: - [phone]  [facility]  Office  [phone]  CC: Primary care physician; Gerilyn Pilgrim, FNP

## 2016-11-04 MED ORDER — ACETAMINOPHEN 650 MG RE SUPP: 650.0000 mg | RECTAL | Status: DC | PRN

## 2016-11-04 NOTE — Progress Notes (Signed)
CH made a follow up visit. Pt was non-responsive. Husband was bedside. [place] spent a few min with Husband and listened to his story. CH offered to sit with him later in the afternoon. Husband said he might take me up on it. Husband stated that this is the hardest thing he has ever done.  CH is available for follow up as needed.    11/04/16 1000  Clinical Encounter Type  Visited With Patient;Patient and family together  Visit Type Follow-up;Spiritual support  Referral From Nurse  Spiritual Encounters  Spiritual Needs Emotional;Grief support

## 2016-11-04 NOTE — Progress Notes (Signed)
[facility] at [place] NAME: Elizabeth Mccoy    MRN#:  [id number]  DATE OF BIRTH:  1956-08-26  SUBJECTIVE:  Hospital Day: 14 days Elizabeth Mccoy is a 60 y.o. female presenting with Nausea and Dizziness .   Overnight events: No acute overnight events Interval Events: Patient unable to provide information given mental status/medical condition. Family at bedside, patient lethargic intermittently moaning-on morphine drip   REVIEW OF SYSTEMS:  Unable to obtain given patient's medical condition  DRUG ALLERGIES:   Allergies  Allergen Reactions  . Beef Extract Anaphylaxis  . Pork (Porcine) Protein Anaphylaxis  . Beef-Derived Products Hives    VITALS:  Blood pressure (!) 149/81, pulse (!) 115, temperature 99.4 F (37.4 C), resp. rate (!) 8, height 5\' 1"  (1.549 m), weight 55.8 kg (123 lb 1.6 oz), SpO2 92 %.  PHYSICAL EXAMINATION:   VITAL SIGNS: Vitals:   11/04/16 0828 11/04/16 1114  BP:    Pulse:    Resp:    Temp: (!) 101.5 F (38.6 C) 99.4 F (37.4 C)   GENERAL:60 y.o.female moderate distress given mental status. Chronically ill, weak appearing HEAD: Normocephalic, atraumatic.  EYES: Pupils equal, round, reactive to light. Unable to assess extraocular muscles given mental status/medical condition. No scleral icterus.  MOUTH: Moist mucosal membrane. Dentition intact. No abscess noted.  EAR, NOSE, THROAT: Clear without exudates. No external lesions.  NECK: Supple. No thyromegaly. No nodules. No JVD.  PULMONARY: Clear to ascultation, without wheeze rails or rhonci. No use of accessory muscles, Good respiratory effort. good air entry bilaterally CHEST: Nontender to palpation.  CARDIOVASCULAR: S1 and S2. Regular rate and rhythm. No murmurs, rubs, or gallops. No edema. Pedal pulses 2+ bilaterally.  GASTROINTESTINAL: Soft, nontender, nondistended. No masses. Positive bowel sounds. No hepatosplenomegaly.  MUSCULOSKELETAL: No  swelling, clubbing, or edema. Range of motion full in all extremities.  NEUROLOGIC: Unable to assess given mental status/medical condition SKIN: No ulceration, lesions, rashes, or cyanosis. Skin warm and dry. Turgor intact.  PSYCHIATRIC: Unable to assess given mental status/medical condition      LABORATORY PANEL:   CBC No results for input(s): WBC, HGB, HCT, PLT in the last 168 hours. ------------------------------------------------------------------------------------------------------------------  Chemistries   Recent Labs Lab 10/31/16 0543  NA 126*  K 4.6  CL 97*  CO2 21*  GLUCOSE 131*  BUN 9  CREATININE <0.30*  CALCIUM 6.2*  MG 1.9   ------------------------------------------------------------------------------------------------------------------  Cardiac Enzymes No results for input(s): TROPONINI in the last 168 hours. ------------------------------------------------------------------------------------------------------------------  RADIOLOGY:  No results found.  EKG:   Orders placed or performed in visit on 10/21/16  . EKG 12-Lead    ASSESSMENT AND PLAN:   Elizabeth Mccoy is a 60 y.o. female presenting with Nausea and Dizziness . Admitted 10/21/2016 : Day #: 14 days 1. 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 Aspiration pneumonia 3. 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 Management plans discussed with the patient, family and they are in agreement.  CODE STATUS: dnr TOTAL TIME TAKING CARE OF THIS PATIENT: 28  minutes.   POSSIBLE D/C IN  1-2DAYS, DEPENDING ON CLINICAL CONDITION.   Hower,  Karenann Cai.D on 11/04/2016 at 12:19 PM  Between 7am to 6pm - Pager - [phone]  After 6pm: House Pager: - [facility]  Office  [phone]  CC: Primary care physician; Gerilyn Pilgrim, FNP

## 2016-11-05 ENCOUNTER — Ambulatory Visit: Payer: BLUE CROSS/BLUE SHIELD

## 2016-11-05 ENCOUNTER — Telehealth: Payer: Self-pay | Admitting: *Deleted

## 2016-11-05 MED ORDER — ACETAMINOPHEN 650 MG RE SUPP: 650.0000 mg | RECTAL | 0 refills | Status: AC | PRN

## 2016-11-05 MED ORDER — MORPHINE 100MG IN NS 100ML (1MG/ML) PREMIX INFUSION: 7.0000 mg/h | INTRAVENOUS | 0 refills | Status: AC

## 2016-11-05 NOTE — Progress Notes (Signed)
New hospice home referral received from [facility]. Elizabeth Mccoy is a 60 year old woman with a known history of metastatic breast cancer to liver, bone and brain. She was admitted to [facility] on 11/12 for evaluation and treatment of intractable nausea vomiting and pain requiring IV decadron. CT in the ED revealed increasing hydrocephalus. She has received brain radiation during this hospitalization and has continued to decline. Palliative medicine was consulted on 11/20 for goals of care.and has had conversations with both the patient and her family. Family chose to make her comfort care on 11/24 and a continuous morphine drip was started at 3 mg/hr for management of pain, rate is now up to 7 mg/hr, she also required six 1 mg bolus doses yesterday and 2 doses of IV lorazepam at 1 mg each.  Patient seen lying in bed, eyes open, no response to verbal stimulation. Family at bedside, husband, daughter and sister in law. Writer met with family in the family room to initiate education regarding hospice services, philosophy and team approach to care with good understanding voiced. Plan is for transfer today to the hospice home once morphine pump is ready at the hospice home. Prescription obtained for the continuous morphine drip and faxed to hospice triage. Report called to the hospice home. Signed DNR in place in patient's chart. Writer to contact EMS for transport. Hospital team all aware of and in agreement with plan. Thank you for the opportunity to be involved in the care of this patient and her family. Flo Shanks RN, BSN, [facility] [place], hospital Liaison [phone] c

## 2016-11-05 NOTE — Progress Notes (Addendum)
Clinical Education officer, museum (CSW) received verbal consult from MD that patient is ready to transport to a hospice facility. CSW contacted patient's husband Vivika Witter and provided hospice choice. Husband chose [facility]. [facility] liaison has been notified.   Per Santiago Glad there is a hospice bed available today. D/C packet complete. Please reconsult if future social work needs arise. CSW signing off.   [employer], LCSW [phone]

## 2016-11-05 NOTE — Progress Notes (Signed)
EMS notified for transport at 2:20 pm. Family and staff RN Stanton Kidney made aware. Thank you. Flo Shanks RN, BSN, [facility] [place], [facility] [phone]

## 2016-11-05 NOTE — Discharge Instructions (Signed)
Continue comfort measures...

## 2016-11-05 NOTE — Telephone Encounter (Signed)
Hospital is wanting to discharge her and family does not want her moved since she is dying. Asking that Dr Rogue Bussing please go see her and stop the move form hospital

## 2016-11-05 NOTE — Telephone Encounter (Signed)
md made aware. He will address concerns.

## 2016-11-05 NOTE — Progress Notes (Signed)
[facility] NOTE  Patient Care Team: Gerilyn Pilgrim, FNP as PCP - General (Nurse Practitioner)  CHIEF COMPLAINTS/PURPOSE OF CONSULTATION:  Metastatic breast cancer/ patient unable to provide any history because of mental status changes/morphine medication  CC: History is provided by patient's husband at the bedside.No acute issues overnight. Pt on the IV morphine drip. "Comfortable".   ROS: Unable to assess  MEDICAL HISTORY:  Past Medical History:  Diagnosis Date  . Breast cancer, right ([place]) 2015   rad tx's on right hip.  . Menopause 12/10/2006   2008    SURGICAL HISTORY: Past Surgical History:  Procedure Laterality Date  . CESAREAN SECTION      SOCIAL HISTORY: Social History   Social History  . Marital status: Married    Spouse name: N/A  . Number of children: 2  . Years of education: N/A   Occupational History  . Not on file.   Social History Main Topics  . Smoking status: Never Smoker  . Smokeless tobacco: Never Used  . Alcohol use No  . Drug use: No  . Sexual activity: Not on file   Other Topics Concern  . Not on file   Social History Narrative  . No narrative on file    FAMILY HISTORY: Family History  Problem Relation Age of Onset  . Cancer Father     prostate ca  . Cancer Sister     cancer unknown primary    ALLERGIES:  is allergic to beef extract; pork (porcine) protein; and beef-derived products.  MEDICATIONS:  Current Facility-Administered Medications  Medication Dose Route Frequency Provider Last Rate Last Dose  . acetaminophen (TYLENOL) suppository 650 mg  650 mg Rectal Q4H PRN Lytle Butte, MD      . glycopyrrolate (ROBINUL) injection 0.2 mg  0.2 mg Intravenous Q4H PRN Basilio Cairo, NP   0.2 mg at 11/02/16 0737  . haloperidol lactate (HALDOL) injection 2.5 mg  2.5 mg Intramuscular Once Epifanio Lesches, MD      . LORazepam (ATIVAN) injection 1 mg  1 mg Intravenous Q2H PRN Harrie Foreman, MD   1 mg at 11/04/16  0911  . morphine 100mg  in NS 16mL (1mg /mL) infusion - premix  7 mg/hr Intravenous Continuous Lytle Butte, MD 7 mL/hr at 11/05/16 0955 7 mg/hr at 11/05/16 0955  . morphine bolus via infusion 1 mg  1 mg Intravenous Q15 min PRN Lytle Butte, MD   1 mg at 11/05/16 1439  . ondansetron (ZOFRAN) injection 4 mg  4 mg Intravenous Q6H PRN Hillary Bow, MD   4 mg at 10/29/16 0153  . promethazine (PHENERGAN) injection 25 mg  25 mg Intravenous Q6H PRN Epifanio Lesches, MD   25 mg at 10/25/16 1308   Current Outpatient Prescriptions  Medication Sig Dispense Refill  . acetaminophen (TYLENOL) 650 MG suppository Place 1 suppository (650 mg total) rectally every 4 (four) hours as needed for fever. 12 suppository 0  . morphine 1 mg/mL SOLN infusion Inject 7 mg/hr into the vein continuous. 100 mL 0      .  PHYSICAL EXAMINATION:  Vitals:   11/04/16 1114 11/05/16 0419  BP:  (!) 143/80  Pulse:  (!) 115  Resp:  14  Temp: 99.4 F (37.4 C) 98 F (36.7 C)   Filed Weights   10/29/16 0500 10/30/16 0453 10/31/16 0500  Weight: 119 lb (54 kg) 120 lb 3.2 oz (54.5 kg) 123 lb 1.6 oz (55.8 kg)    GENERAL:  Moderate built moderately nourished female patient resting in the bed. Resting comfortably Accompanied by her husband.  EYES: no pallor or icterus OROPHARYNX: Cannot be examined felt LUNGS: decreased breath sounds to auscultation at bases and  No wheeze or crackles HEART/CVS: regular rate & rhythm and no murmurs; No lower extremity edema ABDOMEN: abdomen soft, non-tender and normal bowel sounds Musculoskeletal:no cyanosis of digits and no clubbing  PSYCH:  drowsy ; not arousable  NEURO:  cannot be performed  SKIN:  no rashes or significant lesions  LABORATORY DATA:  I have reviewed the data as listed Lab Results  Component Value Date   WBC 14.3 (H) 10/26/2016   HGB 12.0 10/26/2016   HCT 35.6 10/26/2016   MCV 95.2 10/26/2016   PLT 150 10/26/2016    Recent Labs  10/21/16 1458  10/23/16 0457  10/26/16 0356  10/29/16 0528 10/30/16 0455 10/31/16 0543  NA 127*  < > 133* 130*  < > 129* 132* 126*  K 4.4  < > 4.4 3.9  < > 4.2 4.1 4.6  CL 97*  < > 104 101  < > 101 102 97*  CO2 19*  < > 21* 21*  < > 20* 21* 21*  GLUCOSE 117*  < > 135* 133*  < > 121* 125* 131*  BUN 17  < > 12 13  < > 12 11 9   CREATININE 0.48  < > 0.40* 0.37*  < > <0.30* 0.33* <0.30*  CALCIUM 7.8*  < > 7.4* 6.9*  < > 6.1* 6.0* 6.2*  GFRNONAA >60  < > >60 >60  < > NOT CALCULATED >60 NOT CALCULATED  GFRAA >60  < > >60 >60  < > NOT CALCULATED >60 NOT CALCULATED  PROT 7.1  --  6.3* 6.3*  --   --   --   --   ALBUMIN 4.2  --  3.7 3.5  --   --  3.1*  --   AST 54*  --  55* 85*  --   --   --   --   ALT 64*  --  81* 217*  --   --   --   --   ALKPHOS 229*  --  164* 164*  --   --   --   --   BILITOT 1.0  --  0.8 2.7*  --   --   --   --   BILIDIR  --   --   --  1.4*  --   --   --   --   IBILI  --   --   --  1.3*  --   --   --   --   < > = values in this interval not displayed.  RADIOGRAPHIC STUDIES: I have personally reviewed the radiological images as listed and agreed with the findings in the report. Ct Head Wo Contrast  Result Date: 10/21/2016 CLINICAL DATA:  Metastatic breast cancer. EXAM: CT HEAD WITHOUT CONTRAST TECHNIQUE: Contiguous axial images were obtained from the base of the skull through the vertex without intravenous contrast. COMPARISON:  MRI head 10/12/2016 FINDINGS: Brain: Progressive ventricular dilatation since the prior MRI. This is most consistent with hydrocephalus in this patient with metastatic disease and may be due to leptomeningeal tumor. No obstructing mass lesion. Multiple small metastatic deposits are seen throughout the brain on the MRI. These are not well seen on unenhanced CT however there is mild edema in the right occipital lobe and in the  right frontal lobe over the convexity consistent with metastatic disease. Negative for hemorrhage. Vascular: Negative for dense MCA Skull: Patchy sclerotic  disease in the clivus compatible with metastatic disease. No destructive skull lesion. Sinuses/Orbits: Infiltration of the orbit with enlargement of the extra-ocular muscles most consistent with metastatic disease to the orbit as identified on MRI. Other: None IMPRESSION: Progressive ventricular dilatation since the recent MRI compatible with hydrocephalus. 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 Multiple brain metastasis better seen on MRI. No large obstructing mass lesion is identified. Negative for hemorrhage Metastatic disease to the clivus. Metastatic disease to the orbit bilaterally. Electronically Signed   By: Franchot Gallo M.D.   On: 10/21/2016 17:22   Ct Chest W Contrast  Result Date: 10/11/2016 CLINICAL DATA:  Metastatic breast cancer. EXAM: CT CHEST, ABDOMEN, AND PELVIS WITH CONTRAST TECHNIQUE: Multidetector CT imaging of the chest, abdomen and pelvis was performed following the standard protocol during bolus administration of intravenous contrast. CONTRAST:  160mL ISOVUE-300 IOPAMIDOL (ISOVUE-300) INJECTION 61% COMPARISON:  08/29/2016 FINDINGS: CT CHEST FINDINGS Cardiovascular: The heart size appears normal. Aortic atherosclerosis noted. Mediastinum/Nodes: The trachea appears patent and is midline. Normal appearance of the esophagus. Right axillary node measures 9 mm, image number 18 of series 2. Previously this measured the same. Interval decrease in soft tissue stranding within the right axilla which may be related to prior instrumentation. No enlarged mediastinal or hilar nodes. Lungs/Pleura: Small right pleural effusion. Mild diffuse bronchial wall thickening noted. Left upper lobe pulmonary nodule is unchanged measuring 4 mm, image 56 of series 4. Perifissural nodule in the left lower lobe is stable measuring 4 mm, image number 80 of series 4. No new or enlarging pulmonary nodules identified Musculoskeletal: Extensive, widespread sclerotic bone metastases  again noted. Subjectively increased to previous exam. CT ABDOMEN PELVIS FINDINGS Hepatobiliary: Multifocal liver metastases again identified. Index lesion within the right lobe measures 2.6 cm, image 41 of series 2. Previously this measured the same. Lateral segment of left lobe of liver lesion measures 1.5 cm, image 49 of series 2. Previously 2.1 cm. The index lesion within the lateral right lobe of liver measures 2 cm, image 51 of series 2. Previously 2.3 cm. Index lesion within the inferior right lobe measures 2.4 cm, image 59 of series 2. Previously 2.6 cm. Gallbladder appears normal. No biliary dilatation. Pancreas: Normal appearance of the pancreas. Spleen: No change in small low density structure within the spleen measuring 6 mm, image 51 of series 2. Adrenals/Urinary Tract: The adrenal glands are normal. There is persistent bilateral pelvocaliectasis. No hydroureter. Enhancing nodule along the anterior wall of the bladder is again noted, image 81 89 of series 6. Stomach/Bowel: The stomach is normal. The small bowel loops have a normal caliber. No bowel obstruction. No pathologic dilatation of the colon. Increase scratch set there is mild thickening of the appendix which measures 8 mm in diameter, image 77 of series 2. No free fluid or fluid collections noted within in the right lower quadrant. Vascular/Lymphatic: Normal appearance of the abdominal aorta. No enlarged upper abdominal lymph nodes. There is no pelvic or inguinal adenopathy. Reproductive: The uterus appears unremarkable. The right ovary measures 2.8 x 2.0 cm, image 89 of series 2. Previously 2.8 x 1.5 cm. Other: There is a small amount of free fluid identified within the pelvis. This is new from previous exam. Additionally, there is a suggestion subtle soft tissue nodularity within the peritoneal fat. For example, image number 64 of  series 2 Musculoskeletal: Diffuse all osseous sclerosis compatible secondary to metastatic disease is again  identified. IMPRESSION: 1. Index liver lesions are slightly decreased in size from previous exam. 2. Widespread sclerotic bone metastasis as noted previously. Subjectively there may be an increase in the sclerosis which could reflect healing of bone metastases or progression of sclerotic bone metastases. 3. New ascites identified within the pelvis. Subtle areas of soft tissue nodularity within the peritoneum is concerning for development of peritoneal metastases. 4. Slight enlargement of the right ovary. Attention to the right ovary on follow-up imaging to monitor for potential ovarian metastases. Electronically Signed   By: Kerby Moors M.D.   On: 10/11/2016 11:59   Mr Jeri Cos [id number] Contrast  Result Date: 10/12/2016 CLINICAL DATA:  60 year old female with metastatic breast cancer. Staging. Subsequent encounter. EXAM: MRI HEAD WITHOUT AND WITH CONTRAST TECHNIQUE: Multiplanar, multiecho pulse sequences of the brain and surrounding structures were obtained without and with intravenous contrast. CONTRAST:  33mL MULTIHANCE GADOBENATE DIMEGLUMINE 529 MG/ML IV SOLN COMPARISON:  PET-CT 11/14/2015. FINDINGS: Brain: Numerous small brain metastases. There are at least 15 small supratentorial metastases ranging from punctate to 10 mm diameter. There is also extensive metastatic disease affecting the cerebellum with widespread small areas of abnormal enhancement, many of which are suspicious for leptomeningeal disease (series 16 images 14 and 15). The largest individual cerebellar lesions are 8-9 mm. Despite the extensive disease there is only occasional mild cerebral and cerebellar edema, and no significant intracranial mass effect. No hemorrhagic metastases. No abnormal ependymoma 1 enhancement. No pachymeningeal thickening or enhancement. No restricted diffusion or evidence of acute infarction. No ventriculomegaly. No acute intracranial hemorrhage identified. Negative pituitary. Negative cervicomedullary junction. No  chronic cerebral blood products identified. There are small chronic lacunar infarcts in the left cerebellum. Subtle chronic cortical encephalomalacia in the right parietal lobe (series 9, image 21) compatible with previous small posterior right MCA infarct. Vascular: Major intracranial vascular flow voids are preserved, the distal left vertebral artery appears dominant. Skull and upper cervical spine: Diffuse abnormal bone marrow signal throughout the cervical spine and at the skullbase in keeping with widespread osseous metastatic disease. No destructive calvarial lesion identified. Negative visualized cervical spinal cord. Sinuses/Orbits: Abnormal heterogeneous appearance of the bilateral intraorbital soft tissues including the right lateral rectus muscle which is expanded in T2 hyperintense (series 14, image 20 and series 9, image 9). Similar heterogeneity throughout the left orbital soft tissues. There seems to be a mild degree of enophthalmos. The globes appear remain intact. Trace paranasal sinus mucosal thickening. Other: Mastoids are clear. Visible internal auditory structures appear normal. Negative scalp soft tissues. IMPRESSION: 1. Widespread metastatic disease to the brain. At least 15 small supratentorial metastases ranging from punctate to 10 mm diameter, and extensive cerebellar metastatic disease which appears highly suspicious for leptomeningeal metastases. 2. Only mild scattered brain edema and no significant intracranial mass effect. 3. Bilateral orbit soft tissue metastases (scirrhous infiltration of the orbits). 4. Widespread osseous metastatic disease. Electronically Signed   By: Genevie Ann M.D.   On: 10/12/2016 13:18   Mr Thoracic Spine W Wo Contrast  Result Date: 10/23/2016 CLINICAL DATA:  Metastatic breast cancer. EXAM: MRI THORACIC AND LUMBAR SPINE WITHOUT AND WITH CONTRAST TECHNIQUE: Multiplanar and multiecho pulse sequences of the thoracic and lumbar spine were obtained without and with  intravenous contrast. CONTRAST:  54mL MULTIHANCE GADOBENATE DIMEGLUMINE 529 MG/ML IV SOLN COMPARISON:  Bone scan 08/29/2016 FINDINGS: MRI THORACIC SPINE FINDINGS Alignment:  Physiologic. Vertebrae: Heterogeneous marrow diffusely  from extensive metastatic disease. Every thoracic vertebra is involved. No extraosseous tumor growth is noted in the epidural or foraminal spaces. No pathologic fracture. Cord: The cord has normal signal, but there is diffuse surface enhancement of the cord and intrathecal roots. Paraspinal and other soft tissues: Known hepatic metastatic disease. Trace right pleural effusion. Disc levels: No degenerative impingement MRI LUMBAR SPINE FINDINGS Segmentation:  Standard. Alignment:  Physiologic. Vertebrae: Metastases throughout all lumbar levels. No pathologic fracture or epidural/ foraminal infiltration. Conus medullaris: Extends to the T12 level and appears normal. There is diffuse thickening enhancement of the cauda equina Paraspinal and other soft tissues: The bladder is over distended and there is bilateral moderate hydroureteronephrosis. Disc levels: Lower lumbar facet arthropathy.  No degenerative impingement. These results will be called to the ordering clinician or representative by the Radiologist Assistant, and communication documented in the PACS or zVision Dashboard. IMPRESSION: 1. 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 2. Widespread osseous metastatic disease without pathologic fracture or detected epidural infiltration. 3. Over distended bladder with bilateral hydronephrosis, correlate for retention related to #1. 4. Known hepatic metastatic disease. Electronically Signed   By: Monte Fantasia M.D.   On: 10/23/2016 13:37   Mr Lumbar Spine W Wo Contrast  Result Date: 10/23/2016 CLINICAL DATA:  Metastatic breast cancer. EXAM: MRI THORACIC AND LUMBAR SPINE WITHOUT AND WITH CONTRAST TECHNIQUE: Multiplanar  and multiecho pulse sequences of the thoracic and lumbar spine were obtained without and with intravenous contrast. CONTRAST:  49mL MULTIHANCE GADOBENATE DIMEGLUMINE 529 MG/ML IV SOLN COMPARISON:  Bone scan 08/29/2016 FINDINGS: MRI THORACIC SPINE FINDINGS Alignment:  Physiologic. Vertebrae: Heterogeneous marrow diffusely from extensive metastatic disease. Every thoracic vertebra is involved. No extraosseous tumor growth is noted in the epidural or foraminal spaces. No pathologic fracture. Cord: The cord has normal signal, but there is diffuse surface enhancement of the cord and intrathecal roots. Paraspinal and other soft tissues: Known hepatic metastatic disease. Trace right pleural effusion. Disc levels: No degenerative impingement MRI LUMBAR SPINE FINDINGS Segmentation:  Standard. Alignment:  Physiologic. Vertebrae: Metastases throughout all lumbar levels. No pathologic fracture or epidural/ foraminal infiltration. Conus medullaris: Extends to the T12 level and appears normal. There is diffuse thickening enhancement of the cauda equina Paraspinal and other soft tissues: The bladder is over distended and there is bilateral moderate hydroureteronephrosis. Disc levels: Lower lumbar facet arthropathy.  No degenerative impingement. These results will be called to the ordering clinician or representative by the Radiologist Assistant, and communication documented in the PACS or zVision Dashboard. IMPRESSION: 1. 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 2. Widespread osseous metastatic disease without pathologic fracture or detected epidural infiltration. 3. Over distended bladder with bilateral hydronephrosis, correlate for retention related to #1. 4. Known hepatic metastatic disease. Electronically Signed   By: Monte Fantasia M.D.   On: 10/23/2016 13:37   Ct Abdomen Pelvis W Contrast  Result Date: 10/11/2016 CLINICAL DATA:  Metastatic breast  cancer. EXAM: CT CHEST, ABDOMEN, AND PELVIS WITH CONTRAST TECHNIQUE: Multidetector CT imaging of the chest, abdomen and pelvis was performed following the standard protocol during bolus administration of intravenous contrast. CONTRAST:  143mL ISOVUE-300 IOPAMIDOL (ISOVUE-300) INJECTION 61% COMPARISON:  08/29/2016 FINDINGS: CT CHEST FINDINGS Cardiovascular: The heart size appears normal. Aortic atherosclerosis noted. Mediastinum/Nodes: The trachea appears patent and is midline. Normal appearance of the esophagus. Right axillary node measures 9 mm, image number 18 of series 2. Previously this  measured the same. Interval decrease in soft tissue stranding within the right axilla which may be related to prior instrumentation. No enlarged mediastinal or hilar nodes. Lungs/Pleura: Small right pleural effusion. Mild diffuse bronchial wall thickening noted. Left upper lobe pulmonary nodule is unchanged measuring 4 mm, image 56 of series 4. Perifissural nodule in the left lower lobe is stable measuring 4 mm, image number 80 of series 4. No new or enlarging pulmonary nodules identified Musculoskeletal: Extensive, widespread sclerotic bone metastases again noted. Subjectively increased to previous exam. CT ABDOMEN PELVIS FINDINGS Hepatobiliary: Multifocal liver metastases again identified. Index lesion within the right lobe measures 2.6 cm, image 41 of series 2. Previously this measured the same. Lateral segment of left lobe of liver lesion measures 1.5 cm, image 49 of series 2. Previously 2.1 cm. The index lesion within the lateral right lobe of liver measures 2 cm, image 51 of series 2. Previously 2.3 cm. Index lesion within the inferior right lobe measures 2.4 cm, image 59 of series 2. Previously 2.6 cm. Gallbladder appears normal. No biliary dilatation. Pancreas: Normal appearance of the pancreas. Spleen: No change in small low density structure within the spleen measuring 6 mm, image 51 of series 2. Adrenals/Urinary Tract:  The adrenal glands are normal. There is persistent bilateral pelvocaliectasis. No hydroureter. Enhancing nodule along the anterior wall of the bladder is again noted, image 81 89 of series 6. Stomach/Bowel: The stomach is normal. The small bowel loops have a normal caliber. No bowel obstruction. No pathologic dilatation of the colon. Increase scratch set there is mild thickening of the appendix which measures 8 mm in diameter, image 77 of series 2. No free fluid or fluid collections noted within in the right lower quadrant. Vascular/Lymphatic: Normal appearance of the abdominal aorta. No enlarged upper abdominal lymph nodes. There is no pelvic or inguinal adenopathy. Reproductive: The uterus appears unremarkable. The right ovary measures 2.8 x 2.0 cm, image 89 of series 2. Previously 2.8 x 1.5 cm. Other: There is a small amount of free fluid identified within the pelvis. This is new from previous exam. Additionally, there is a suggestion subtle soft tissue nodularity within the peritoneal fat. For example, image number 64 of series 2 Musculoskeletal: Diffuse all osseous sclerosis compatible secondary to metastatic disease is again identified. IMPRESSION: 1. Index liver lesions are slightly decreased in size from previous exam. 2. Widespread sclerotic bone metastasis as noted previously. Subjectively there may be an increase in the sclerosis which could reflect healing of bone metastases or progression of sclerotic bone metastases. 3. New ascites identified within the pelvis. Subtle areas of soft tissue nodularity within the peritoneum is concerning for development of peritoneal metastases. 4. Slight enlargement of the right ovary. Attention to the right ovary on follow-up imaging to monitor for potential ovarian metastases. Electronically Signed   By: Kerby Moors M.D.   On: 10/11/2016 11:59   Dg Chest Port 1 View  Result Date: 10/25/2016 CLINICAL DATA:  PICC line placement EXAM: PORTABLE CHEST 1 VIEW  COMPARISON:  10/11/2016 chest CT FINDINGS: The heart is normal in size. There is aortic atherosclerosis. Diffuse axial and appendicular skeletal metastasis is noted. PICC line tip terminates at the cavoatrial junction from a right-sided approach. New car fell linear pulmonary opacities in the right upper lobe cannot exclude pneumonia. The left lung is clear. There is no effusion. IMPRESSION: PICC line tip in the cavoatrial junction. Patchy airspace opacities in the right upper lobe are new and may reflect pneumonia. Diffuse osteoblastic  skeletal metastatic disease. Electronically Signed   By: Ashley Royalty M.D.   On: 10/25/2016 22:17   Dg Addison Bailey G Tube Plc W/fl W/rad  Result Date: 10/26/2016 CLINICAL DATA:  NG tube placement EXAM: NASO G TUBE PLACEMENT WITH FL AND WITH RAD FLUOROSCOPY TIME:  Fluoroscopy Time:  6 second Radiation Exposure Index (if provided by the fluoroscopic device): Number of Acquired Spot Images: 0 COMPARISON:  None. FINDINGS: NG tube was placed by the radiologist. Single spot image over the average demonstrates the NG tube tip in the proximal stomach. IMPRESSION: NG tube tip in the proximal stomach. Electronically Signed   By: Rolm Baptise M.D.   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  # Pain control-  better controlled on IV morphine drip.  # Discussed with the patient's husband that the life expectancy is in the order of a few days; however patient has been stable over the last few days; and we do not have inpatient hospice unit. Recommend discharge to hospice.  Discussed with Dr. Posey Pronto; Hassan Rowan social worker. Also spoke to [facility].    Cammie Sickle, MD 11/05/2016 4:33 PM

## 2016-11-05 NOTE — Discharge Summary (Signed)
[facility] [place] NAME: Elizabeth Mccoy    MR#:  [id number]  DATE OF BIRTH:  14-Feb-1956  DATE OF ADMISSION:  10/21/2016 ADMITTING PHYSICIAN: Harrel Lemon, MD  DATE OF DISCHARGE: 10/2716  PRIMARY CARE PHYSICIAN: Gerilyn Pilgrim, FNP    ADMISSION DIAGNOSIS:  Dehydration [E86.0] Poor appetite [R63.0] Metastatic cancer ([place]) [C79.9] Elevated lipase [R74.8]  DISCHARGE DIAGNOSIS:  MetastaticBreast cancer Failure to thrive  SECONDARY DIAGNOSIS:   Past Medical History:  Diagnosis Date  . Breast cancer, right ([place]) 2015   rad tx's on right hip.  . 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 Radiation discontinued.Continue comfort measures.  -pt appears comfortable  # Pain control- better controlled on IV morphine drip.  #  patient'shusband is awarethat the life expectancy is in the order of a few days.  Patient has a bed available at [facility] facility. Husband is okay with patient discharging to hospice facility. Patient will go to the hospice facility with IV morphine drip and Foley catheter. Comfort measures will be continued at hospice facility. CONSULTS OBTAINED:  Treatment Team:  Lloyd Huger, MD Blanche East, MD  DRUG ALLERGIES:   Allergies  Allergen Reactions  . Beef Extract Anaphylaxis  . Pork (Porcine) Protein Anaphylaxis  . Beef-Derived Products Hives    DISCHARGE MEDICATIONS:   Current Discharge Medication List    START taking these medications   Details  acetaminophen (TYLENOL) 650 MG suppository  Place 1 suppository (650 mg total) rectally every 4 (four) hours as needed for fever. Qty: 12 suppository, Refills: 0    morphine 1 mg/mL SOLN infusion Inject 7 mg/hr into the vein continuous. Qty: 100 mL, Refills: 0      STOP taking these medications     calcium-vitamin D (OSCAL WITH D) 500-200 MG-UNIT per tablet      dexamethasone (DECADRON) 4 MG tablet      dronabinol (MARINOL) 5 MG capsule      loratadine (CLARITIN) 10 MG tablet      omeprazole (PRILOSEC) 20 MG capsule      prochlorperazine (COMPAZINE) 10 MG tablet      traMADol (ULTRAM) 50 MG tablet      lidocaine-prilocaine (EMLA) cream         If you experience worsening of your admission symptoms, develop shortness of breath, life threatening emergency, suicidal or homicidal thoughts you must seek medical attention immediately by calling 911 or calling your MD immediately  if symptoms less severe.  You Must read complete instructions/literature along with all the possible adverse reactions/side effects for all the Medicines you take and that have been prescribed to you. Take any new Medicines after you have completely understood and accept all the possible adverse reactions/side effects.   Please note  You were cared for by a hospitalist during your hospital stay. If you have any questions about your discharge medications or the care you received while you were in the hospital after you are discharged, you can call the unit and asked to speak with the hospitalist on call if the hospitalist that took care of you is not available. Once you are discharged, your primary care physician will handle  any further medical issues. Please note that NO REFILLS for any discharge medications will be authorized once you are discharged, as it is imperative that you return to your primary care physician (or establish a relationship with a primary care physician if you do not have one) for your aftercare needs so that they can reassess your  need for medications and monitor your lab values.  DATA REVIEW:   CBC  No results for input(s): WBC, HGB, HCT, PLT in the last 168 hours.  Chemistries   Recent Labs Lab 10/31/16 0543  NA 126*  K 4.6  CL 97*  CO2 21*  GLUCOSE 131*  BUN 9  CREATININE <0.30*  CALCIUM 6.2*  MG 1.9    Microbiology Results   No results found for this or any previous visit (from the past 240 hour(s)).  RADIOLOGY:  No results found.   Management plans discussed with the patient, family and they are in agreement.  CODE STATUS:     Code Status Orders        Start     Ordered   10/27/16 1110  Do not attempt resuscitation (DNR)  Continuous    Question Answer Comment  In the event of cardiac or respiratory ARREST Do not call a "code blue"   In the event of cardiac or respiratory ARREST Do not perform Intubation, CPR, defibrillation or ACLS   In the event of cardiac or respiratory ARREST Use medication by any route, position, wound care, and other measures to relive pain and suffering. May use oxygen, suction and manual treatment of airway obstruction as needed for comfort.   Comments NURSE MAY PRONOUNCE.      10/27/16 1110    Code Status History    Date Active Date Inactive Code Status Order ID Comments User Context   10/27/2016 11:01 AM 10/27/2016 11:10 AM DNR [id number]  Lloyd Huger, MD Inpatient   10/21/2016  8:03 PM 10/27/2016 11:01 AM Full Code [id number]  Baxter Hire, MD Inpatient      TOTAL TIME TAKING CARE OF THIS PATIENT: 40 minutes.    Astrid Vides M.D on 11/05/2016 at 10:25 AM  Between 7am to 6pm - Pager - [phone] After 6pm go to [email or web address] - password [email or web address] [facility]  Office  [phone]  CC: Primary care physician; Gerilyn Pilgrim, FNP

## 2016-11-05 NOTE — Plan of Care (Signed)
It is believed patient is stable enough to transport to [facility].  VS obtained.  O2 sats 83% but she's not on oxygen.  Morphine bolus given before she was disconnected from the pump for transport.  Patient will be on [place] [facility] at [facility] and it's ready for her there.  Patient's husband and his daughter are at bedside. Support given to husband and step-daughter. Foley was emptied of 125 cc amber, cloudy, non-odorous urine.

## 2016-11-05 NOTE — Progress Notes (Signed)
[place] at [place] NAME: Elizabeth Mccoy    MR#:  [id number]  DATE OF BIRTH:  27-Jan-1956  SUBJECTIVE:  Comfortable on morphine gtt No family in the room  REVIEW OF SYSTEMS:   Review of Systems  Unable to perform ROS: Patient unresponsive    DRUG ALLERGIES:   Allergies  Allergen Reactions  . Beef Extract Anaphylaxis  . Pork (Porcine) Protein Anaphylaxis  . Beef-Derived Products Hives    VITALS:  Blood pressure (!) 143/80, pulse (!) 115, temperature 98 F (36.7 C), temperature source Oral, resp. rate 14, height 5\' 1"  (1.549 m), weight 55.8 kg (123 lb 1.6 oz), SpO2 (!) 86 %.  PHYSICAL EXAMINATION:   Physical Exam  GENERAL:  60 y.o.-year-old patient lying in the bed with no acute distress. Critically ill EYES: Pupils equal, round, reactive to light and accommodation. No scleral icterus. Extraocular muscles intact.  HEENT: Head atraumatic, normocephalic. Alopecia acquired NECK:  Supple, no jugular venous distention. No thyroid enlargement, no tenderness.  LUNGS: Normal breath sounds bilaterally, no wheezing, rales, rhonchi. No use of accessory muscles of respiration.  CARDIOVASCULAR: S1, S2 normal. No murmurs, rubs, or gallops.  ABDOMEN: Soft, nontender, nondistended. Bowel sounds present. No organomegaly or mass.  EXTREMITIES: No cyanosis, clubbing or edema b/l.    NEUROLOGIC: unable to assess pt on morphine unresponsive on morphine gtt SKIN: unable to assess on morphine gtt  LABORATORY PANEL:  CBC No results for input(s): WBC, HGB, HCT, PLT in the last 168 hours.  Chemistries   Recent Labs Lab 10/31/16 0543  NA 126*  K 4.6  CL 97*  CO2 21*  GLUCOSE 131*  BUN 9  CREATININE <0.30*  CALCIUM 6.2*  MG 1.9   Cardiac Enzymes No results for input(s): TROPONINI in the last 168 hours. RADIOLOGY:  No results found. 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 Radiation discontinued. Continue comfort measures.  -pt appears comfortable  # Pain control-  better controlled on IV morphine drip.  #  patient's husband is aware that the life expectancy is in the order of a few days. They understand. If patient continues to survive over the weekend- disposition to [facility].   Case discussed with Care Management/Social Worker.   CODE STATUS: DNR  TOTAL TIME TAKING CARE OF THIS PATIENT:20 minutes.  >50% time spent on counselling and coordination of care  Note: This dictation was prepared with Dragon dictation along with smaller phrase technology. Any transcriptional errors that result from this process are unintentional.  Tomisha Reppucci M.D on 11/05/2016 at 7:40 AM  Between 7am to 6pm - Pager - [phone]  After 6pm go to [email or web address] - password [email or web address] [facility]  Office  [phone]  CC: Primary care physician; Gerilyn Pilgrim, FNP

## 2016-11-06 ENCOUNTER — Ambulatory Visit: Payer: BLUE CROSS/BLUE SHIELD

## 2016-11-07 ENCOUNTER — Ambulatory Visit: Payer: BLUE CROSS/BLUE SHIELD

## 2016-11-08 ENCOUNTER — Ambulatory Visit: Payer: BLUE CROSS/BLUE SHIELD

## 2016-11-08 ENCOUNTER — Telehealth: Payer: Self-pay | Admitting: *Deleted

## 2016-11-09 ENCOUNTER — Ambulatory Visit: Payer: BLUE CROSS/BLUE SHIELD

## 2016-11-09 NOTE — Telephone Encounter (Signed)
Called to report that patient expired this morning November 25, 2016 at 6:55 AM

## 2016-11-09 DEATH — deceased

## 2016-11-10 ENCOUNTER — Telehealth: Payer: Self-pay | Admitting: Internal Medicine

## 2016-11-10 NOTE — Telephone Encounter (Signed)
Left condolences message for the husband/to call us back if needed.

## 2016-11-12 ENCOUNTER — Ambulatory Visit: Payer: BLUE CROSS/BLUE SHIELD

## 2016-11-13 ENCOUNTER — Ambulatory Visit: Payer: BLUE CROSS/BLUE SHIELD

## 2016-11-14 ENCOUNTER — Ambulatory Visit: Payer: BLUE CROSS/BLUE SHIELD

## 2016-12-14 ENCOUNTER — Other Ambulatory Visit: Payer: Self-pay | Admitting: Nurse Practitioner

## 2017-04-28 IMAGING — MR MR LUMBAR SPINE WO/W CM
10 of 16 series · 31 of 48 positions shown · IV contrast (multihance)
Comparison: Bone scan 08/29/2016

CLINICAL DATA: Metastatic breast cancer.

EXAM:
MRI THORACIC AND LUMBAR SPINE WITHOUT AND WITH CONTRAST
TECHNIQUE: Multiplanar and multiecho pulse sequences of the thoracic and lumbar
spine were obtained without and with intravenous contrast.
CONTRAST:  10mL MULTIHANCE GADOBENATE DIMEGLUMINE 529 MG/ML IV SOLN

[Series 4: T2 · sagittal · 4.0mm · 1.00mm/px · 1 of 13 slices shown (1 of 4)]
[im 1/13]
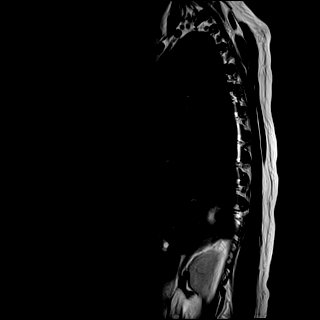

[Series 5: T1 · sagittal · 4.0mm · 1.00mm/px · 2 of 13 slices shown (1 of 5)]
[im 1/13]
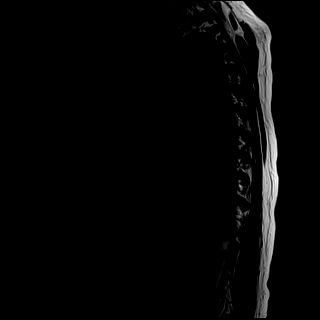
[im 13/13]
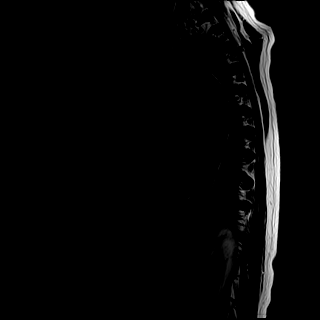

[Series 7: T2 · axial · 5.0mm · 0.86mm/px · z∈[-217,+31]mm · 5 of 43 slices shown (2 of 4)]
[im 1/43]
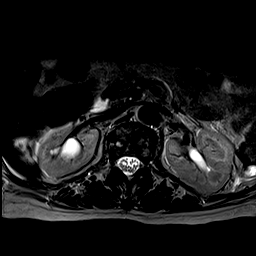
[im 11/43]
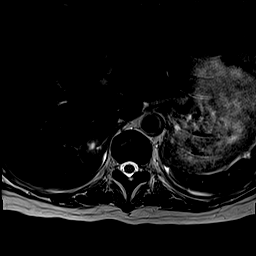
[im 22/43]
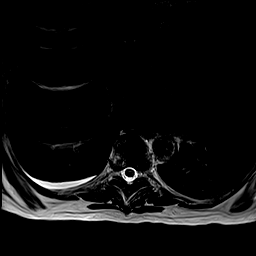
[im 32/43]
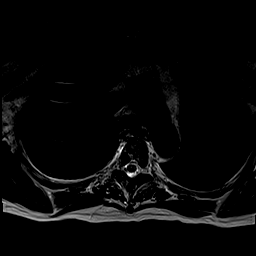
[im 43/43]
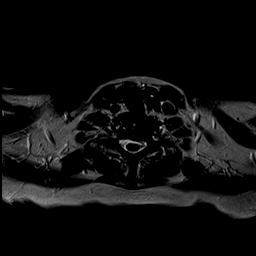

[Series 8: T1 · axial · non-contrast · 5.0mm · 0.43mm/px · z∈[-219,+33]mm · 5 of 43 slices shown (2 of 5)]
[im 1/43]
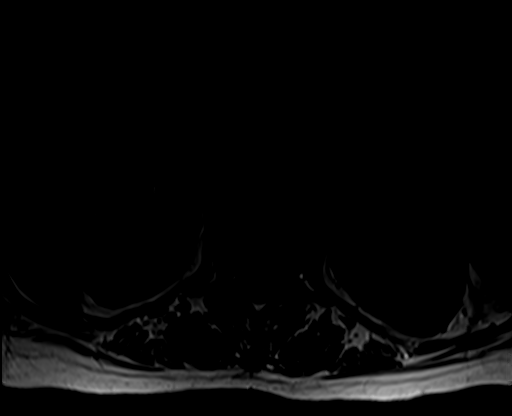
[im 11/43]
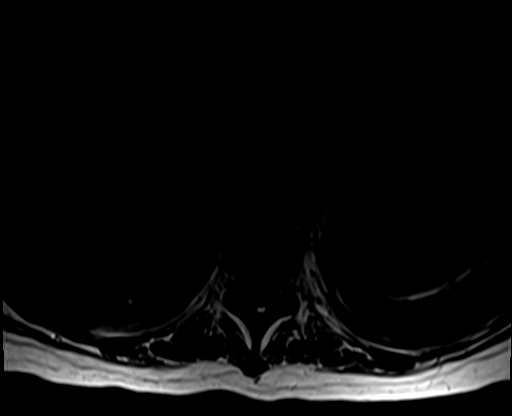
[im 22/43]
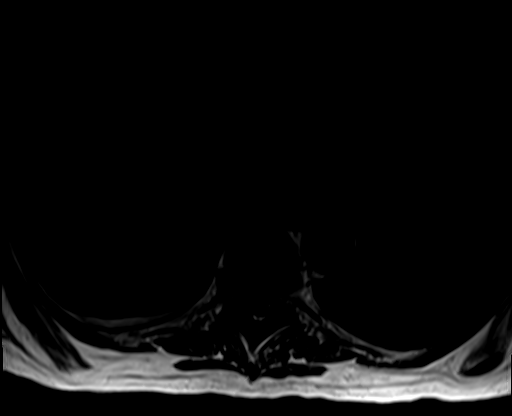
[im 32/43]
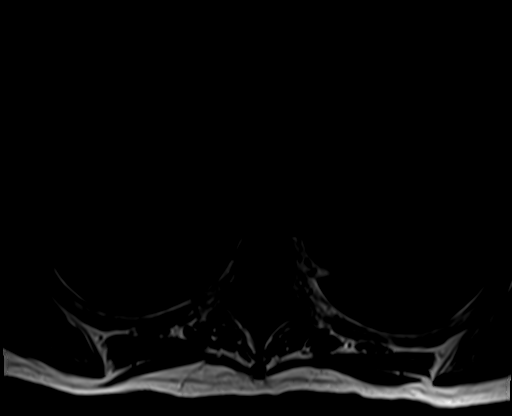
[im 43/43]
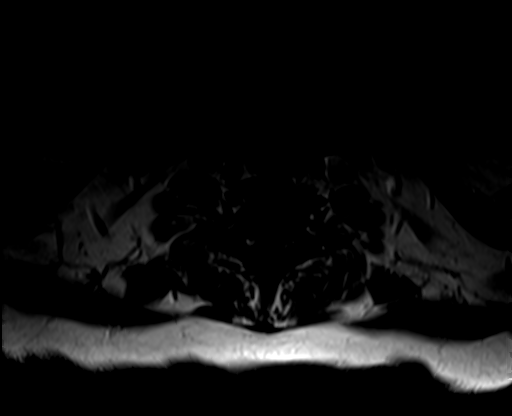

[Series 11: T2 · sagittal · 4.0mm · 0.81mm/px · 2 of 17 slices shown (3 of 4)]
[im 1/17]
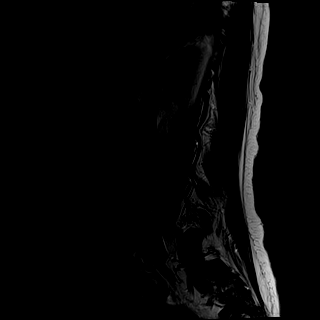
[im 17/17]
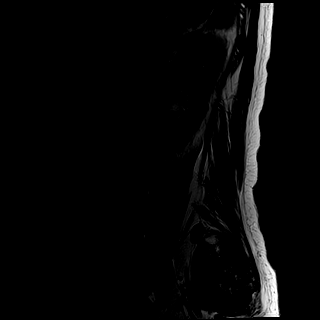

[Series 12: T1 · sagittal · 4.0mm · 0.81mm/px · 2 of 17 slices shown (3 of 5)]
[im 1/17]
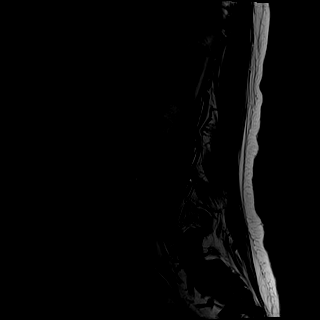
[im 17/17]
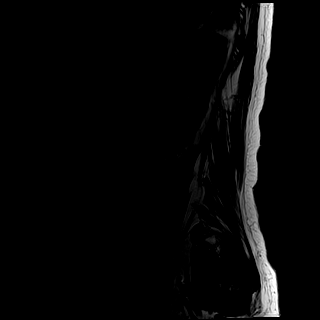

[Series 14: T2 · axial · 4.0mm · 0.78mm/px · z∈[-412,-205]mm · 4 of 38 slices shown (4 of 4)]
[im 1/38]
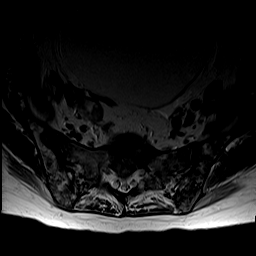
[im 13/38]
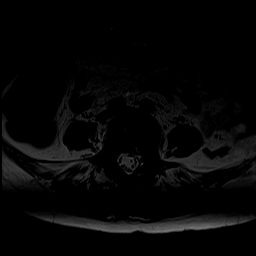
[im 25/38]
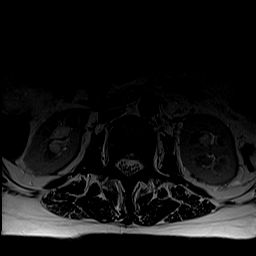
[im 38/38]
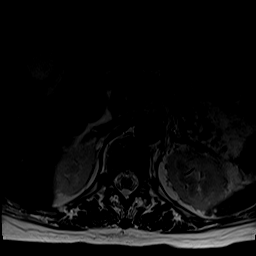

[Series 15: T1 · axial · 4.0mm · 0.39mm/px · z∈[-412,-205]mm · 4 of 38 slices shown (4 of 5)]
[im 1/38]
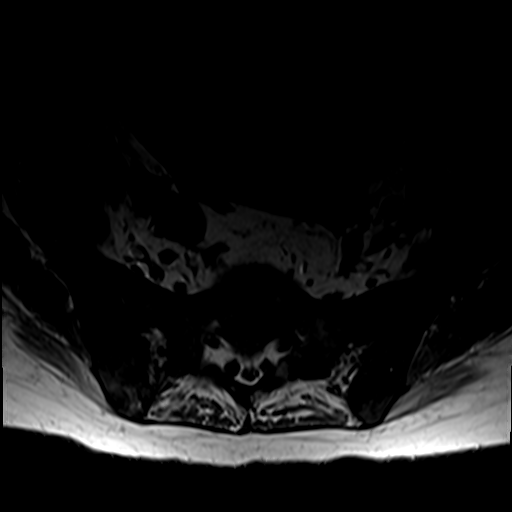
[im 13/38]
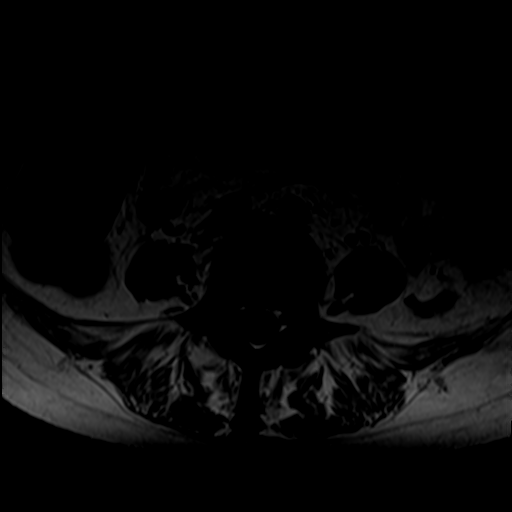
[im 25/38]
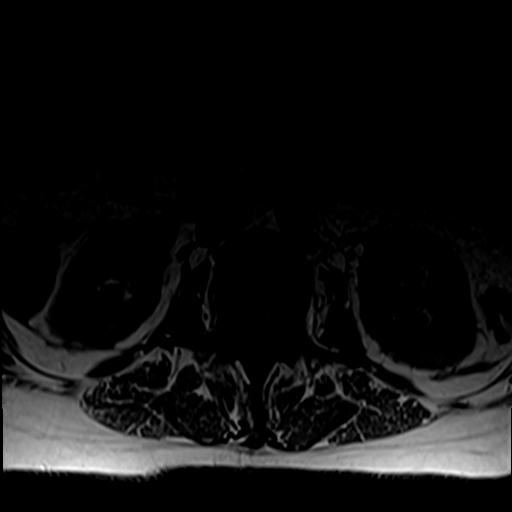
[im 38/38]
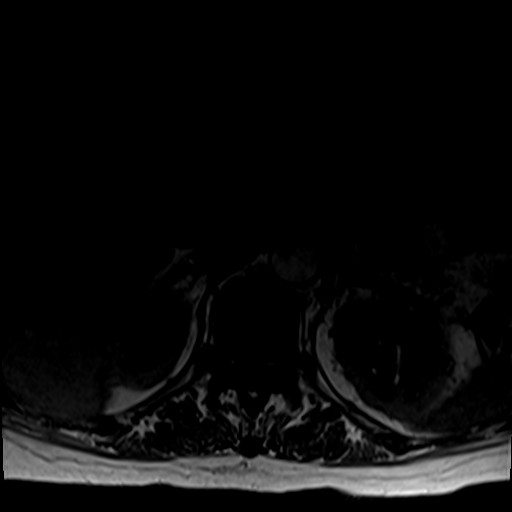

[Series 17: T1 · axial · 4.0mm · 0.39mm/px · z∈[-412,-205]mm · 4 of 38 slices shown (5 of 5)]
[im 1/38]
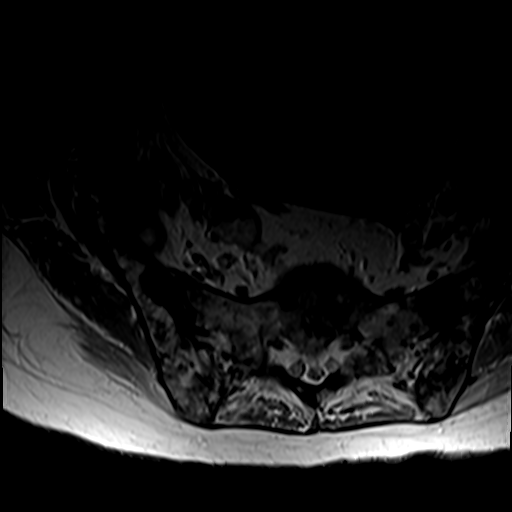
[im 13/38]
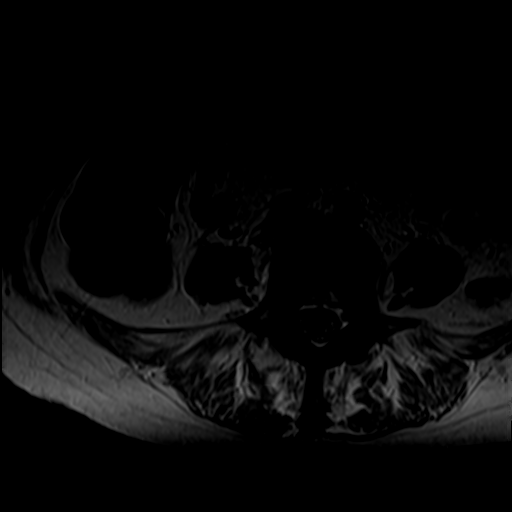
[im 25/38]
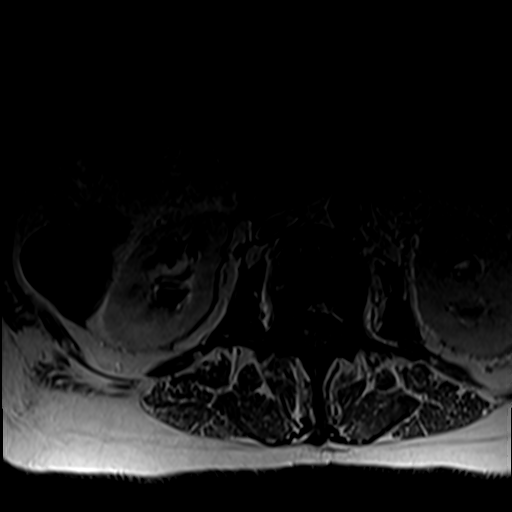
[im 38/38]
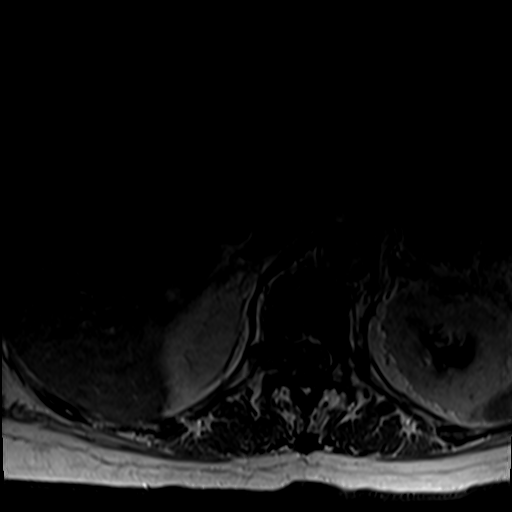

[Series 18: T1 fat-sat post-contrast · sagittal · 4.0mm · 0.81mm/px · 2 of 17 slices shown]
[im 1/17]
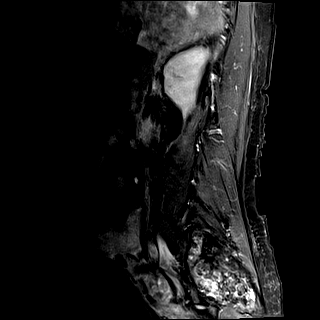
[im 17/17]
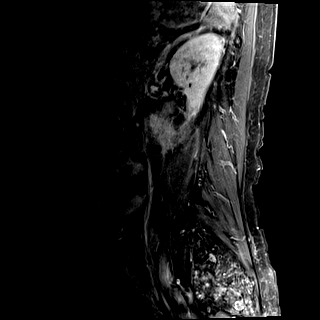

[31 of 48 positions shown; findings below may reference images not displayed]

FINDINGS: MRI THORACIC SPINE FINDINGS

Alignment:  Physiologic.

Vertebrae: Heterogeneous marrow diffusely from extensive metastatic
disease. Every thoracic vertebra is involved. No extraosseous tumor
growth is noted in the epidural or foraminal spaces. No pathologic
fracture.

Cord: The cord has normal signal, but there is diffuse surface
enhancement of the cord and intrathecal roots.

Paraspinal and other soft tissues: Known hepatic metastatic disease.
Trace right pleural effusion.

Disc levels:

No degenerative impingement

MRI LUMBAR SPINE FINDINGS

Segmentation:  Standard.

Alignment:  Physiologic.

Vertebrae: Metastases throughout all lumbar levels. No pathologic
fracture or epidural/ foraminal infiltration.

Conus medullaris: Extends to the T12 level and appears normal. There
is diffuse thickening enhancement of the cauda equina

Paraspinal and other soft tissues: The bladder is over distended and
there is bilateral moderate hydroureteronephrosis.

Disc levels:

Lower lumbar facet arthropathy.  No degenerative impingement.

These results will be called to the ordering clinician or
representative by the Radiologist Assistant, and communication
documented in the PACS or zVision Dashboard.
IMPRESSION: 1. Leptomeningeal carcinomatosis throughout the thoracic and lumbar
spine which could be confirmed with CSF cytology.
2. Widespread osseous metastatic disease without pathologic fracture
or detected epidural infiltration.
3. Over distended bladder with bilateral hydronephrosis, correlate
for retention related to #1.
4. Known hepatic metastatic disease.

## 2017-04-30 IMAGING — DX DG CHEST 1V PORT
1 series · 1 of 1 positions shown · non-contrast
Comparison: 10/11/2016 chest CT

CLINICAL DATA: PICC line placement

EXAM:
PORTABLE CHEST 1 VIEW

[chest ap]
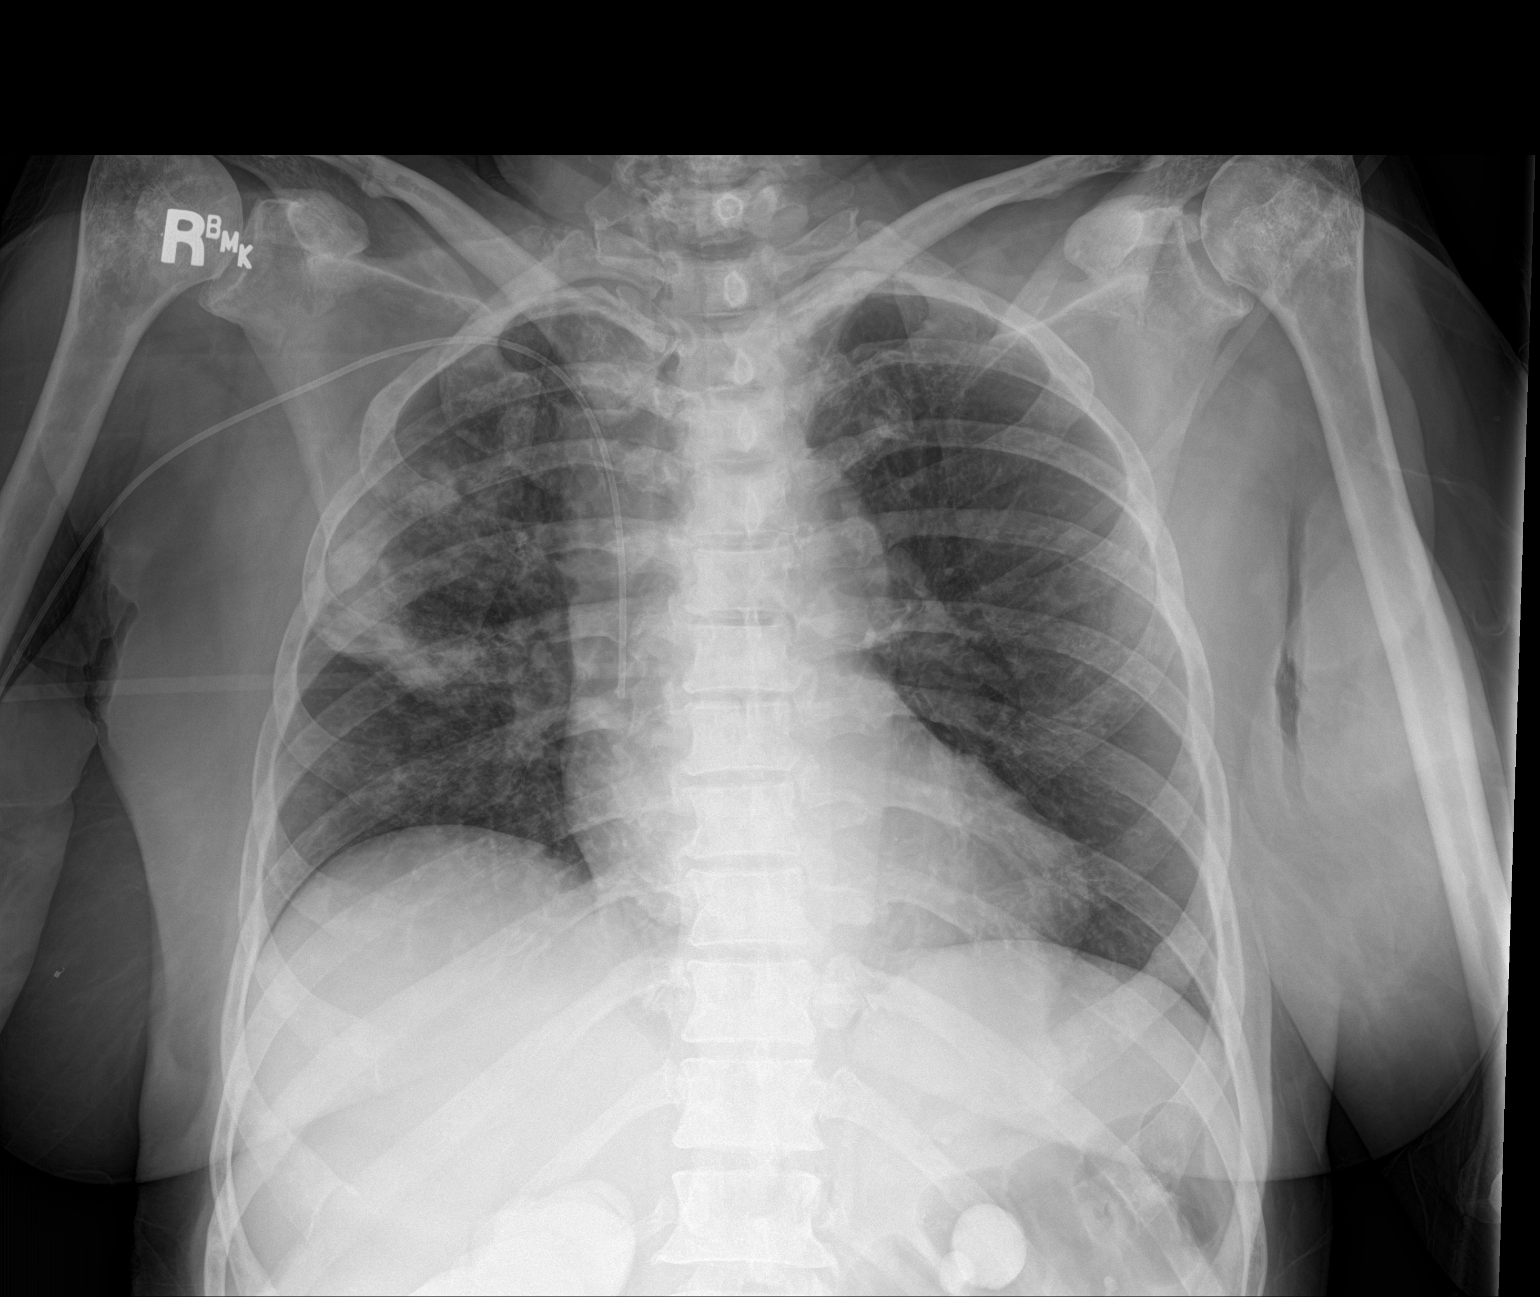

[1 of 1 positions shown; findings below may reference images not displayed]

FINDINGS: The heart is normal in size. There is aortic atherosclerosis.
Diffuse axial and appendicular skeletal metastasis is noted. PICC
line tip terminates at the cavoatrial junction from a right-sided
approach. New car fell linear pulmonary opacities in the right upper
lobe cannot exclude pneumonia. The left lung is clear. There is no
effusion.
IMPRESSION: PICC line tip in the cavoatrial junction. Patchy airspace opacities
in the right upper lobe are new and may reflect pneumonia. Diffuse
osteoblastic skeletal metastatic disease.

## 2017-05-01 IMAGING — RF DG NASO G TUBE PLC W/FL W/RAD
1 series · 1 of 1 positions shown · non-contrast
Comparison: None.

CLINICAL DATA: NG tube placement

EXAM:
NASO G TUBE PLACEMENT WITH FL AND WITH RAD
FLUOROSCOPY TIME:  Fluoroscopy Time:  6 second
Radiation Exposure Index (if provided by the fluoroscopic device):
Number of Acquired Spot Images: 0

[Series 1: cp_standard · 0.25mm/px · 1 of 1 slices shown]
[im 1/1]
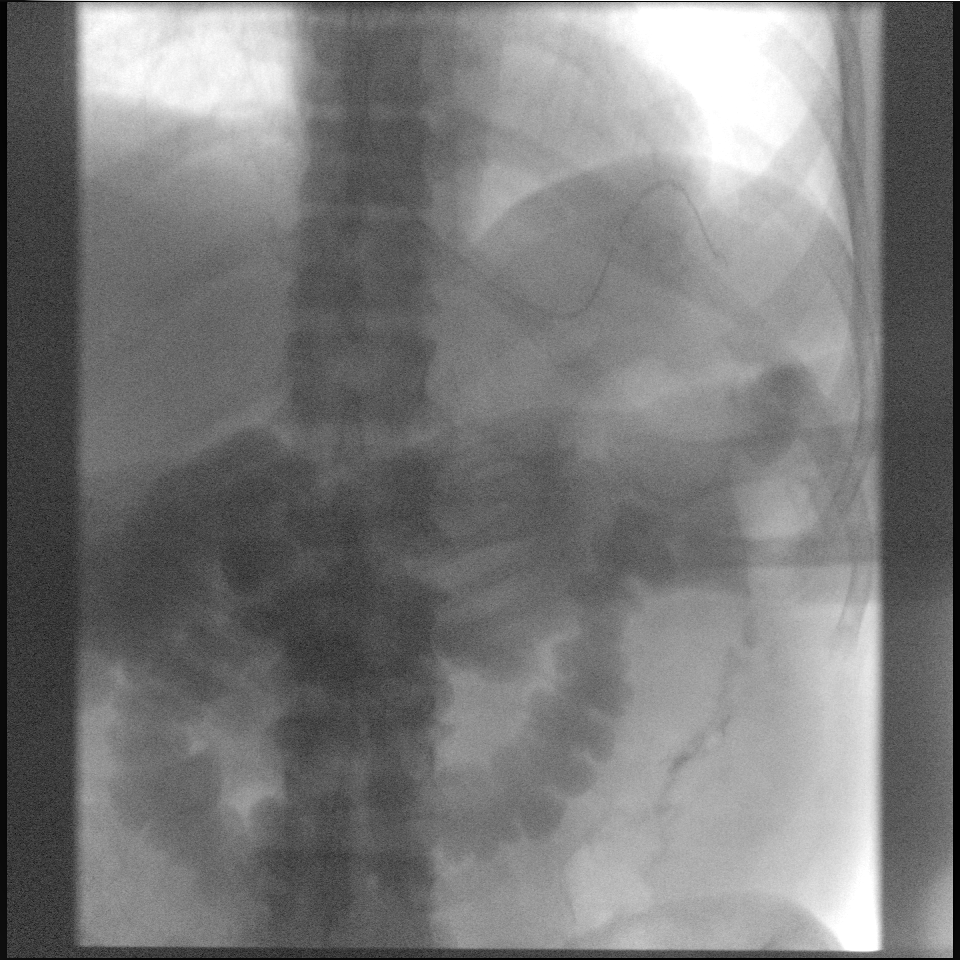

[1 of 1 positions shown; findings below may reference images not displayed]

FINDINGS: NG tube was placed by the radiologist. Single spot image over the
average demonstrates the NG tube tip in the proximal stomach.
IMPRESSION: NG tube tip in the proximal stomach.

## 2020-10-14 ENCOUNTER — Telehealth: Payer: Self-pay | Admitting: *Deleted

## 2020-10-14 NOTE — Telephone Encounter (Signed)
Dr. Rogue Bussing asked me to review chart to see if genetic testing was ever order on this patient. Reviewed chart. Unable to locate any braca testing results on the chart.i reviewed the records scanned in [place] as well.  I will reach out to [facility].

## 2020-10-15 NOTE — Telephone Encounter (Signed)
I will keep you posted if I get any information on this pt. My staff is working on it. GB

## 2020-10-18 NOTE — Telephone Encounter (Signed)
I went through the entire chart in [employer]. No braca/genetic testing found. I will reach out to [facility] to see if they have any results on file
